# Patient Record
Sex: Female | Born: 1995 | Race: Black or African American | Hispanic: No | Smoking: Never smoker
Health system: Southern US, Community
[De-identification: ages and names within clinical notes are randomized; demographics above are authoritative.]

## PROBLEM LIST (undated history)

## (undated) ENCOUNTER — Inpatient Hospital Stay (HOSPITAL_COMMUNITY): Payer: Self-pay

## (undated) ENCOUNTER — Ambulatory Visit (HOSPITAL_COMMUNITY): Admission: EM | Payer: Medicaid Other | Source: Home / Self Care

## (undated) DIAGNOSIS — B999 Unspecified infectious disease: Secondary | ICD-10-CM

## (undated) DIAGNOSIS — R55 Syncope and collapse: Secondary | ICD-10-CM

## (undated) DIAGNOSIS — O139 Gestational [pregnancy-induced] hypertension without significant proteinuria, unspecified trimester: Secondary | ICD-10-CM

## (undated) DIAGNOSIS — R519 Headache, unspecified: Secondary | ICD-10-CM

## (undated) DIAGNOSIS — R51 Headache: Secondary | ICD-10-CM

## (undated) DIAGNOSIS — R1110 Vomiting, unspecified: Secondary | ICD-10-CM

## (undated) DIAGNOSIS — I10 Essential (primary) hypertension: Secondary | ICD-10-CM

## (undated) DIAGNOSIS — E079 Disorder of thyroid, unspecified: Secondary | ICD-10-CM

## (undated) DIAGNOSIS — O99019 Anemia complicating pregnancy, unspecified trimester: Secondary | ICD-10-CM

## (undated) DIAGNOSIS — J45909 Unspecified asthma, uncomplicated: Secondary | ICD-10-CM

## (undated) DIAGNOSIS — R011 Cardiac murmur, unspecified: Secondary | ICD-10-CM

## (undated) HISTORY — PX: WISDOM TOOTH EXTRACTION: SHX21

## (undated) HISTORY — PX: TUBAL LIGATION: SHX77

## (undated) HISTORY — DX: Anemia complicating pregnancy, unspecified trimester: O99.019

---

## 2013-08-17 ENCOUNTER — Encounter (HOSPITAL_COMMUNITY): Payer: Self-pay | Admitting: Emergency Medicine

## 2013-08-17 DIAGNOSIS — R350 Frequency of micturition: Secondary | ICD-10-CM | POA: Insufficient documentation

## 2013-08-17 DIAGNOSIS — Z3202 Encounter for pregnancy test, result negative: Secondary | ICD-10-CM | POA: Insufficient documentation

## 2013-08-17 DIAGNOSIS — Y9389 Activity, other specified: Secondary | ICD-10-CM | POA: Insufficient documentation

## 2013-08-17 DIAGNOSIS — IMO0002 Reserved for concepts with insufficient information to code with codable children: Secondary | ICD-10-CM | POA: Insufficient documentation

## 2013-08-17 DIAGNOSIS — Y92410 Unspecified street and highway as the place of occurrence of the external cause: Secondary | ICD-10-CM | POA: Insufficient documentation
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
## 2013-08-18 ENCOUNTER — Emergency Department (HOSPITAL_COMMUNITY)
Admission: EM | Admit: 2013-08-18 | Discharge: 2013-08-18 | Disposition: A | Discharge status: Home / Self Care | Payer: No Typology Code available for payment source | Attending: Emergency Medicine | Admitting: Emergency Medicine

## 2013-08-18 DIAGNOSIS — T148XXA Other injury of unspecified body region, initial encounter: Secondary | ICD-10-CM

## 2013-08-18 LAB — URINALYSIS, ROUTINE W REFLEX MICROSCOPIC
Leukocytes, UA: NEGATIVE
Nitrite: NEGATIVE
Specific Gravity, Urine: 1.021 (ref 1.005–1.030)
pH: 5.5 (ref 5.0–8.0)
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
## 2013-08-18 NOTE — ED Provider Notes (Signed)
  Medical screening examination/treatment/procedure(s) were performed by non-physician practitioner and as supervising physician I was immediately available for consultation/collaboration.   Gerhard Munch, MD 08/18/13 [phone]

## 2014-01-19 ENCOUNTER — Emergency Department (HOSPITAL_COMMUNITY)
Admission: EM | Admit: 2014-01-19 | Discharge: 2014-01-19 | Disposition: A | Discharge status: Home / Self Care | Payer: Medicaid Other | Attending: Emergency Medicine | Admitting: Emergency Medicine

## 2014-01-19 ENCOUNTER — Encounter (HOSPITAL_COMMUNITY): Payer: Self-pay | Admitting: Emergency Medicine

## 2014-01-19 DIAGNOSIS — R233 Spontaneous ecchymoses: Secondary | ICD-10-CM | POA: Insufficient documentation

## 2014-01-19 DIAGNOSIS — T148XXA Other injury of unspecified body region, initial encounter: Secondary | ICD-10-CM

## 2014-01-19 MED ORDER — IBUPROFEN 600 MG PO TABS: 600.0000 mg | ORAL_TABLET | Freq: Four times a day (QID) | ORAL | Status: DC | PRN
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
## 2014-01-19 NOTE — ED Notes (Signed)
Pt. reports bruise at left lateral thigh noticed this evening , denies injury or pain . Alert and oriented / respirations unlabored .

## 2014-01-19 NOTE — ED Notes (Signed)
PA at bedside; ice applied

## 2014-01-19 NOTE — ED Notes (Signed)
PT ambulated with baseline gait; VSS; A&Ox3; no signs of distress; respirations even and unlabored; skin warm and dry; no questions upon discharge.  

## 2014-01-19 NOTE — Discharge Instructions (Signed)
You have a bruise You can take Ibuprofen for discomfort, ice on the area for 20 minutes every 2 hours as needed

## 2014-01-20 NOTE — ED Provider Notes (Signed)
Medical screening examination/treatment/procedure(s) were performed by non-physician practitioner and as supervising physician I was immediately available for consultation/collaboration.  EKG Interpretation   None         Stacey SkeensJoshua M Yoni Lobos, MD 01/20/14 0111

## 2014-02-07 ENCOUNTER — Emergency Department (HOSPITAL_COMMUNITY)
Admission: EM | Admit: 2014-02-07 | Discharge: 2014-02-07 | Disposition: A | Discharge status: Home / Self Care | Payer: Medicaid Other | Attending: Emergency Medicine | Admitting: Emergency Medicine

## 2014-02-07 ENCOUNTER — Encounter (HOSPITAL_COMMUNITY): Payer: Self-pay | Admitting: Emergency Medicine

## 2014-02-07 DIAGNOSIS — B9689 Other specified bacterial agents as the cause of diseases classified elsewhere: Secondary | ICD-10-CM | POA: Insufficient documentation

## 2014-02-07 DIAGNOSIS — R059 Cough, unspecified: Secondary | ICD-10-CM | POA: Insufficient documentation

## 2014-02-07 DIAGNOSIS — N760 Acute vaginitis: Secondary | ICD-10-CM | POA: Insufficient documentation

## 2014-02-07 DIAGNOSIS — A499 Bacterial infection, unspecified: Secondary | ICD-10-CM | POA: Insufficient documentation

## 2014-02-07 DIAGNOSIS — Z3202 Encounter for pregnancy test, result negative: Secondary | ICD-10-CM | POA: Insufficient documentation

## 2014-02-07 DIAGNOSIS — N390 Urinary tract infection, site not specified: Secondary | ICD-10-CM | POA: Insufficient documentation

## 2014-02-07 DIAGNOSIS — R05 Cough: Secondary | ICD-10-CM

## 2014-02-07 LAB — URINALYSIS, ROUTINE W REFLEX MICROSCOPIC
BILIRUBIN URINE: NEGATIVE
GLUCOSE, UA: NEGATIVE mg/dL
Hgb urine dipstick: NEGATIVE
Ketones, ur: NEGATIVE mg/dL
LEUKOCYTES UA: NEGATIVE
NITRITE: NEGATIVE
PH: 7.5 (ref 5.0–8.0)
Protein, ur: NEGATIVE mg/dL
SPECIFIC GRAVITY, URINE: 1.018 (ref 1.005–1.030)
Urobilinogen, UA: 1 mg/dL (ref 0.0–1.0)

## 2014-02-07 LAB — WET PREP, GENITAL
TRICH WET PREP: NONE SEEN
Yeast Wet Prep HPF POC: NONE SEEN

## 2014-02-07 LAB — POCT PREGNANCY, URINE: Preg Test, Ur: NEGATIVE

## 2014-02-07 MED ORDER — PROMETHAZINE-DM 6.25-15 MG/5ML PO SYRP: 5.0000 mL | ORAL_SOLUTION | Freq: Four times a day (QID) | ORAL | Status: DC | PRN

## 2014-02-07 MED ORDER — METRONIDAZOLE 1 % EX GEL: Freq: Every day | CUTANEOUS | Status: DC

## 2014-02-07 MED ORDER — ACETAMINOPHEN 325 MG PO TABS
650.0000 mg | ORAL_TABLET | Freq: Once | ORAL | Status: AC
  Administered 2014-02-07: 650 mg via ORAL
  Filled 2014-02-07: qty 2

## 2014-02-07 NOTE — Discharge Instructions (Signed)

## 2014-02-07 NOTE — ED Notes (Signed)
Pt c/o dry cough, back pain and vaginal discharge for past few days

## 2014-02-07 NOTE — ED Provider Notes (Signed)
CSN: [id number]     Arrival date & time 02/07/14  1027 History   First MD Initiated Contact with Patient 02/07/14 1034     Chief Complaint  Patient presents with  . Cough  . Urinary Tract Infection      HPI  Patient presents with a cough for several days. Vaginal discharge last 3-4 days. Occasional back pain. No history for hematuria. No attempted treatment at home. The recent antibiotic treatment. Denies history of STDs or candida infections. No chest pain no shortness of breath no history of asthma or wheezing.  History reviewed. No pertinent past medical history. History reviewed. No pertinent past surgical history. History reviewed. No pertinent family history. History  Substance Use Topics  . Smoking status: Never Smoker   . Smokeless tobacco: Not on file  . Alcohol Use: No   OB History   Grav Para Term Preterm Abortions TAB SAB Ect Mult Living                 Review of Systems  Constitutional: Negative for fever, chills, diaphoresis, appetite change and fatigue.  HENT: Negative for mouth sores, sore throat and trouble swallowing.   Eyes: Negative for visual disturbance.  Respiratory: Positive for cough. Negative for chest tightness, shortness of breath and wheezing.   Cardiovascular: Negative for chest pain.  Gastrointestinal: Negative for nausea, vomiting, abdominal pain, diarrhea and abdominal distention.  Endocrine: Negative for polydipsia, polyphagia and polyuria.  Genitourinary: Positive for vaginal discharge. Negative for dysuria, frequency and hematuria.  Musculoskeletal: Negative for gait problem.  Skin: Negative for color change, pallor and rash.  Neurological: Negative for dizziness, syncope, light-headedness and headaches.  Hematological: Does not bruise/bleed easily.  Psychiatric/Behavioral: Negative for behavioral problems and confusion.      Allergies  Review of patient's allergies indicates no known allergies.  Home Medications   Current  Outpatient Rx  Name  Route  Sig  Dispense  Refill  . metroNIDAZOLE (METROGEL) 1 % gel   Topical   Apply topically daily.   45 g   0   . promethazine-dextromethorphan (PROMETHAZINE-DM) 6.25-15 MG/5ML syrup   Oral   Take 5 mLs by mouth 4 (four) times daily as needed for cough.   118 mL   0    BP 108/77  Pulse 103  Temp(Src) 100.7 F (38.2 C) (Oral)  Resp 14  Ht 5\' 2"  (1.575 m)  Wt 125 lb (56.7 kg)  BMI 22.86 kg/m2  SpO2 97% Physical Exam  Constitutional: She is oriented to person, place, and time. She appears well-developed and well-nourished. No distress.  HENT:  Head: Normocephalic.  Eyes: Conjunctivae are normal. Pupils are equal, round, and reactive to light. No scleral icterus.  Neck: Normal range of motion. Neck supple. No thyromegaly present.  Cardiovascular: Normal rate and regular rhythm.  Exam reveals no gallop and no friction rub.   No murmur heard. Pulmonary/Chest: Effort normal and breath sounds normal. No respiratory distress. She has no wheezes. She has no rales.  Clear lungs. No abnormal breath sounds. No wheezing. No pronation.  Abdominal: Soft. Bowel sounds are normal. She exhibits no distension. There is no tenderness. There is no rebound.  Genitourinary:    Musculoskeletal: Normal range of motion.  Neurological: She is alert and oriented to person, place, and time.  Skin: Skin is warm and dry. No rash noted.  Psychiatric: She has a normal mood and affect. Her behavior is normal.    ED Course  Procedures (including critical care time)  Labs Review Labs Reviewed  WET PREP, GENITAL - Abnormal; Notable for the following:    Clue Cells Wet Prep HPF POC FEW (*)    WBC, Wet Prep HPF POC FEW (*)    All other components within normal limits  URINALYSIS, ROUTINE W REFLEX MICROSCOPIC - Abnormal; Notable for the following:    APPearance CLOUDY (*)    All other components within normal limits  GC/CHLAMYDIA PROBE AMP  POCT PREGNANCY, URINE   Imaging  Review No results found.  EKG Interpretation   None       MDM   Final diagnoses:  Cough  Bacterial vaginosis    Colic exam shows a noninflamed cervix. No cervical motion tenderness or adnexal pain fullness or tenderness. She is not pregnant. Wet prep shows vaginosis. Normal exam of her lungs. Plan is cough suppressant Flagyl. Warned of the Antabuse effects of the Flagyl. Primary care followup.    Rolland Porter, MD 02/07/14 [phone]

## 2014-02-07 NOTE — ED Notes (Signed)
Vital signs stable. 

## 2014-02-08 LAB — GC/CHLAMYDIA PROBE AMP
CT Probe RNA: POSITIVE — AB
GC PROBE AMP APTIMA: NEGATIVE

## 2014-02-09 NOTE — ED Notes (Signed)
Chart sent to EDP office for review.+ chlamydia

## 2014-02-11 NOTE — ED Notes (Signed)
Chart sent to EDP office for review.Rx for Zithromax 1,000 mg po once as treatment for Chlamydia per Dr Greta DoomBowie .

## 2014-02-15 NOTE — ED Notes (Signed)
Rx called to pharmacy by Liz PFM 

## 2014-04-03 ENCOUNTER — Emergency Department (HOSPITAL_COMMUNITY)
Admission: EM | Admit: 2014-04-03 | Discharge: 2014-04-03 | Disposition: A | Discharge status: Home / Self Care | Payer: Medicaid Other | Attending: Emergency Medicine | Admitting: Emergency Medicine

## 2014-04-03 ENCOUNTER — Encounter (HOSPITAL_COMMUNITY): Payer: Self-pay | Admitting: Emergency Medicine

## 2014-04-03 DIAGNOSIS — A499 Bacterial infection, unspecified: Secondary | ICD-10-CM | POA: Insufficient documentation

## 2014-04-03 DIAGNOSIS — B9689 Other specified bacterial agents as the cause of diseases classified elsewhere: Secondary | ICD-10-CM | POA: Insufficient documentation

## 2014-04-03 DIAGNOSIS — Z3202 Encounter for pregnancy test, result negative: Secondary | ICD-10-CM | POA: Insufficient documentation

## 2014-04-03 DIAGNOSIS — N760 Acute vaginitis: Secondary | ICD-10-CM | POA: Insufficient documentation

## 2014-04-03 LAB — URINALYSIS, ROUTINE W REFLEX MICROSCOPIC
BILIRUBIN URINE: NEGATIVE
Glucose, UA: NEGATIVE mg/dL
HGB URINE DIPSTICK: NEGATIVE
KETONES UR: NEGATIVE mg/dL
Nitrite: NEGATIVE
PH: 7 (ref 5.0–8.0)
Protein, ur: NEGATIVE mg/dL
SPECIFIC GRAVITY, URINE: 1.019 (ref 1.005–1.030)
Urobilinogen, UA: 1 mg/dL (ref 0.0–1.0)

## 2014-04-03 LAB — WET PREP, GENITAL
TRICH WET PREP: NONE SEEN
YEAST WET PREP: NONE SEEN

## 2014-04-03 LAB — PREGNANCY, URINE: Preg Test, Ur: NEGATIVE

## 2014-04-03 LAB — URINE MICROSCOPIC-ADD ON

## 2014-04-03 MED ORDER — AZITHROMYCIN 200 MG/5ML PO SUSR
1000.0000 mg | Freq: Once | ORAL | Status: AC
  Administered 2014-04-03: 1000 mg via ORAL
  Filled 2014-04-03 (×2): qty 25

## 2014-04-03 MED ORDER — CEFTRIAXONE SODIUM 250 MG IJ SOLR
250.0000 mg | INTRAMUSCULAR | Status: DC
  Administered 2014-04-03: 250 mg via INTRAMUSCULAR
  Filled 2014-04-03: qty 250

## 2014-04-03 MED ORDER — AZITHROMYCIN 250 MG PO TABS
1000.0000 mg | ORAL_TABLET | Freq: Once | ORAL | Status: DC
  Filled 2014-04-03: qty 4

## 2014-04-03 MED ORDER — METRONIDAZOLE 500 MG PO TABS
2000.0000 mg | ORAL_TABLET | Freq: Once | ORAL | Status: AC
  Administered 2014-04-03: 2000 mg via ORAL
  Filled 2014-04-03: qty 4

## 2014-04-03 MED ORDER — ONDANSETRON 4 MG PO TBDP
4.0000 mg | ORAL_TABLET | Freq: Once | ORAL | Status: AC
  Administered 2014-04-03: 4 mg via ORAL
  Filled 2014-04-03: qty 1

## 2014-04-03 MED ORDER — LIDOCAINE HCL (PF) 1 % IJ SOLN
INTRAMUSCULAR | Status: AC
  Administered 2014-04-03: 2 mL
  Filled 2014-04-03: qty 5

## 2014-04-03 NOTE — ED Notes (Signed)
Pt reports having vaginal pain, discomfort, and vaginal discharge x 3 weeks.

## 2014-04-03 NOTE — ED Notes (Signed)
Patient states cannot take pills; tried to take Flagyl and began to vomit. Neva SeatGreene, PA changing meds to liquid and remainder of flagyl crushed and mixed in applesauce. Patient tolerating the applesauce mixture.

## 2014-04-03 NOTE — ED Provider Notes (Signed)
CSN: [id number]     Arrival date & time 04/03/14  1304 History   First MD Initiated Contact with Patient 04/03/14 1315     Chief Complaint  Patient presents with  . Vaginal Discharge     (Consider location/radiation/quality/duration/timing/severity/associated sxs/prior Treatment) HPI  Patient reports 3 weeks of suprapubic pain and discharge. She waited this long to present because she has had work everyday. She says that her symptoms have not gotten worse but have not been improving. She reports taking two at home pregnancy test, the first was positive and the second was negative. She has not had any vomiting, fevers, weakness or confusion. She reports the discharge to be cloudy white. NO odor, no itching. No abnormal bleeding.  History reviewed. No pertinent past medical history. History reviewed. No pertinent past surgical history. History reviewed. No pertinent family history. History  Substance Use Topics  . Smoking status: Never Smoker   . Smokeless tobacco: Not on file  . Alcohol Use: No   OB History   Grav Para Term Preterm Abortions TAB SAB Ect Mult Living                 Review of Systems   Review of Systems  Gen: no weight loss, fevers, chills, night sweats  Eyes: no discharge or drainage, no occular pain or visual changes  Nose: no epistaxis or rhinorrhea  Mouth: no dental pain, no sore throat  Neck: no neck pain  Lungs:No wheezing, coughing or hemoptysis CV: no chest pain, palpitations, dependent edema or orthopnea  Abd: no abdominal pain, nausea, vomiting, diarrhea GU: no dysuria or gross hematuria, + suprapubic pain and vaginal discharge MSK:  No muscle weakness or pain Neuro: no headache, no focal neurologic deficits  Skin: no rash or wounds Psyche: no complaints    Allergies  Review of patient's allergies indicates no known allergies.  Home Medications  No current outpatient prescriptions on file. BP 120/69  Pulse 76  Temp(Src) 98.9 F (37.2 C)  (Oral)  Resp 18  Ht 5\' 2"  (1.575 m)  Wt 124 lb (56.246 kg)  BMI 22.67 kg/m2  SpO2 100%  LMP 03/16/2014 Physical Exam  Nursing note and vitals reviewed. Constitutional: She appears well-developed and well-nourished. No distress.  HENT:  Head: Normocephalic and atraumatic.  Eyes: Pupils are equal, round, and reactive to light.  Neck: Normal range of motion. Neck supple.  Cardiovascular: Normal rate and regular rhythm.   Pulmonary/Chest: Effort normal.  Abdominal: Soft.  Genitourinary: Uterus normal. Cervix exhibits discharge. Cervix exhibits no motion tenderness and no friability. No erythema, tenderness or bleeding around the vagina. No foreign body around the vagina. Vaginal discharge found.  Neurological: She is alert.  Skin: Skin is warm and dry.    ED Course  Procedures (including critical care time) Labs Review Labs Reviewed  WET PREP, GENITAL - Abnormal; Notable for the following:    Clue Cells Wet Prep HPF POC FEW (*)    WBC, Wet Prep HPF POC FEW (*)    All other components within normal limits  URINALYSIS, ROUTINE W REFLEX MICROSCOPIC - Abnormal; Notable for the following:    APPearance CLOUDY (*)    Leukocytes, UA SMALL (*)    All other components within normal limits  URINE MICROSCOPIC-ADD ON - Abnormal; Notable for the following:    Squamous Epithelial / LPF FEW (*)    All other components within normal limits  GC/CHLAMYDIA PROBE AMP  PREGNANCY, URINE   Imaging Review No results found.  EKG Interpretation None      MDM   Final diagnoses:  Vaginosis   Patients wet prep shows small amount of clue cells and WBC's. She has been sexually active without protection. Will give her Azithromycin, Rocephin, Flagyl in the ED. Pt advised to refrain from sex for 1 week and to await for the results of the gc cultures.  Referral to wom[facility] outpatient clinics. Pt looks well, no pain, VSS.   18 y.o.Stacey Reid's evaluation in the Emergency Department is  complete. It has been determined that no acute conditions requiring further emergency intervention are present at this time. The patient/guardian have been advised of the diagnosis and plan. We have discussed signs and symptoms that warrant return to the ED, such as changes or worsening in symptoms.  Vital signs are stable at discharge. Filed Vitals:   04/03/14 1328  BP: 120/69  Pulse: 76  Temp:   Resp:     Patient/guardian has voiced understanding and agreed to follow-up with the PCP or specialist.    Dorthula Matasiffany G Callaway Hardigree, PA-C 04/03/14 1416

## 2014-04-03 NOTE — ED Provider Notes (Signed)
Medical screening examination/treatment/procedure(s) were performed by non-physician practitioner and as supervising physician I was immediately available for consultation/collaboration.     Stacey Lyonsouglas Kitrina Maurin, MD 04/03/14 [phone]

## 2014-04-03 NOTE — Discharge Instructions (Signed)

## 2014-04-05 LAB — GC/CHLAMYDIA PROBE AMP
CT PROBE, AMP APTIMA: POSITIVE — AB
GC Probe RNA: NEGATIVE

## 2014-04-10 ENCOUNTER — Telehealth (HOSPITAL_BASED_OUTPATIENT_CLINIC_OR_DEPARTMENT_OTHER): Payer: Self-pay | Admitting: Emergency Medicine

## 2014-04-13 ENCOUNTER — Emergency Department (HOSPITAL_COMMUNITY)
Admission: EM | Admit: 2014-04-13 | Discharge: 2014-04-13 | Discharge status: Against Medical Advice | Payer: Medicaid Other | Attending: Emergency Medicine | Admitting: Emergency Medicine

## 2014-04-13 DIAGNOSIS — N898 Other specified noninflammatory disorders of vagina: Secondary | ICD-10-CM | POA: Insufficient documentation

## 2014-04-13 DIAGNOSIS — Z3202 Encounter for pregnancy test, result negative: Secondary | ICD-10-CM | POA: Insufficient documentation

## 2014-04-13 DIAGNOSIS — Z8619 Personal history of other infectious and parasitic diseases: Secondary | ICD-10-CM | POA: Insufficient documentation

## 2014-04-13 DIAGNOSIS — N949 Unspecified condition associated with female genital organs and menstrual cycle: Secondary | ICD-10-CM | POA: Insufficient documentation

## 2014-04-13 LAB — URINALYSIS, ROUTINE W REFLEX MICROSCOPIC
Bilirubin Urine: NEGATIVE
Glucose, UA: NEGATIVE mg/dL
Hgb urine dipstick: NEGATIVE
Ketones, ur: NEGATIVE mg/dL
Leukocytes, UA: NEGATIVE
Nitrite: NEGATIVE
Protein, ur: NEGATIVE mg/dL
Specific Gravity, Urine: 1.02 (ref 1.005–1.030)
Urobilinogen, UA: 1 mg/dL (ref 0.0–1.0)
pH: 6.5 (ref 5.0–8.0)

## 2014-04-13 LAB — CBC WITH DIFFERENTIAL/PLATELET
Basophils Absolute: 0 K/uL (ref 0.0–0.1)
Basophils Relative: 0 % (ref 0–1)
Eosinophils Absolute: 0 K/uL (ref 0.0–0.7)
Eosinophils Relative: 1 % (ref 0–5)
HCT: 37.9 % (ref 36.0–46.0)
Hemoglobin: 13.4 g/dL (ref 12.0–15.0)
Lymphocytes Relative: 39 % (ref 12–46)
Lymphs Abs: 2.6 10*3/uL (ref 0.7–4.0)
MCH: 31.2 pg (ref 26.0–34.0)
MCHC: 35.4 g/dL (ref 30.0–36.0)
MCV: 88.3 fL (ref 78.0–100.0)
Monocytes Absolute: 0.8 K/uL (ref 0.1–1.0)
Monocytes Relative: 12 % (ref 3–12)
Neutro Abs: 3.3 10*3/uL (ref 1.7–7.7)
Neutrophils Relative %: 48 % (ref 43–77)
Platelets: 339 10*3/uL (ref 150–400)
RBC: 4.29 MIL/uL (ref 3.87–5.11)
RDW: 12 % (ref 11.5–15.5)
WBC: 6.8 10*3/uL (ref 4.0–10.5)

## 2014-04-13 LAB — COMPREHENSIVE METABOLIC PANEL WITH GFR
AST: 33 U/L (ref 0–37)
Albumin: 4 g/dL (ref 3.5–5.2)
CO2: 26 meq/L (ref 19–32)
Calcium: 9.6 mg/dL (ref 8.4–10.5)
Creatinine, Ser: 0.71 mg/dL (ref 0.50–1.10)
GFR calc non Af Amer: >90 mL/min (ref >90)

## 2014-04-13 LAB — COMPREHENSIVE METABOLIC PANEL
ALT: 29 U/L (ref 0–35)
Alkaline Phosphatase: 55 U/L (ref 39–117)
BUN: 8 mg/dL (ref 6–23)
Chloride: 103 mEq/L (ref 96–112)
GFR calc Af Amer: >90 mL/min (ref >90)
Glucose, Bld: 79 mg/dL (ref 70–99)
Potassium: 3.8 mEq/L (ref 3.7–5.3)
Sodium: 137 mEq/L (ref 137–147)
Total Bilirubin: 0.4 mg/dL (ref 0.3–1.2)
Total Protein: 7.8 g/dL (ref 6.0–8.3)

## 2014-04-13 LAB — PREGNANCY, URINE: PREG TEST UR: NEGATIVE

## 2014-04-13 NOTE — ED Notes (Signed)
Pt received emergency phone call from home and walked the room with out receiving discharge after pelvic exam completed. Pt was not ava liable to sign out.

## 2014-04-13 NOTE — ED Provider Notes (Signed)
CSN: [id number]     Arrival date & time 04/13/14  2111 History   First MD Initiated Contact with Patient 04/13/14 2204     Chief Complaint  Patient presents with  . Abdominal Pain  . Possible Pregnancy     (Consider location/radiation/quality/duration/timing/severity/associated sxs/prior Treatment) HPI  Patient presents to the ED with complaints of suprapubic abdominal pains for the past three days with spotting. I saw this patient on 04/03/2014 with similar complaints of suprapubic pain with discharge. She was concerned she was pregnant at that time. She had at at home pregnancy test which was negative, and negative in the ED. She comes back concerned that she may be pregnant, wondering if she has ectopic, and concerned with her pelvic pain and spotting. She was positive for Chlamydia at her last visit, she was treated for it in the ED. She is unsure if she has slept with the person who gave her the chlamydia since I last saw her but she denies having any vaginal discharge today. No nausea, vomiting, diarrhea, fevers.  No past medical history on file. No past surgical history on file. No family history on file. History  Substance Use Topics  . Smoking status: Never Smoker   . Smokeless tobacco: Not on file  . Alcohol Use: No   OB History   Grav Para Term Preterm Abortions TAB SAB Ect Mult Living                 Review of Systems  Review of Systems  Gen: no weight loss, fevers, chills, night sweats  Eyes: no discharge or drainage, no occular pain or visual changes  Nose: no epistaxis or rhinorrhea  Mouth: no dental pain, no sore throat  Neck: no neck pain  Lungs:No wheezing, coughing or hemoptysis CV: no chest pain, palpitations, dependent edema or orthopnea  Abd: no abdominal pain, nausea, vomiting, diarrhea GU: no dysuria or gross hematuria + pelvic pain and spotting blood MSK:  No muscle weakness or pain Neuro: no headache, no focal neurologic deficits  Skin: no rash or  wounds Psyche: no complaints     Allergies  Review of patient's allergies indicates no known allergies.  Home Medications   Prior to Admission medications   Not on File   BP 113/72  Pulse 65  Temp(Src) 98 F (36.7 C) (Oral)  Resp 20  Ht 5\' 1"  (1.549 m)  Wt 124 lb (56.246 kg)  BMI 23.44 kg/m2  SpO2 100%  LMP 03/16/2014 Physical Exam  Nursing note and vitals reviewed. Constitutional: She appears well-developed and well-nourished. No distress.  HENT:  Head: Normocephalic and atraumatic.  Eyes: Pupils are equal, round, and reactive to light.  Neck: Normal range of motion. Neck supple.  Cardiovascular: Normal rate and regular rhythm.   Pulmonary/Chest: Effort normal.  Abdominal: Soft.  Neurological: She is alert.  Skin: Skin is warm and dry.    ED Course  Procedures (including critical care time) Labs Review Labs Reviewed  GC/CHLAMYDIA PROBE AMP  WET PREP, GENITAL  CBC WITH DIFFERENTIAL  COMPREHENSIVE METABOLIC PANEL  URINALYSIS, ROUTINE W REFLEX MICROSCOPIC  PREGNANCY, URINE  POC URINE PREG, ED    Imaging Review No results found.   EKG Interpretation None      MDM   Final diagnoses:  None    Patient had CBC, CMP, urinalysis and urine preg done. Urine preg is negative. I had ordered gc, wet prep and transvag US for further evaluation. Before I could do pelvic exam the  patient was already dressed and told me that her fiance and her mom called her and said she needs to come home right now and they wouldn't tell her why.  She has decided to leave AMA because she is concerned about what is happening with her family. She reports that she will return here when she can or [facility].  Pt leaving AMA    Dorthula Matasiffany G Carlis Burnsworth, PA-C 04/13/14 2327

## 2014-04-13 NOTE — ED Provider Notes (Signed)
  Medical screening examination/treatment/procedure(s) were performed by non-physician practitioner and as supervising physician I was immediately available for consultation/collaboration.   EKG Interpretation None                  If you have any questions or concerns, please don't hesitate to call.     Sincerely,                       Gavin PoundMichael Y. Ibrahima Holberg, MD 04/14/14 0000

## 2014-04-13 NOTE — ED Notes (Signed)
PA at bedside/ pelvic in process

## 2014-04-13 NOTE — ED Notes (Signed)
Pt reports abdominal pain for three days with some spotting. Pt was suppose to start period 4/24. Pt had a positive and negative pregnancy test at home. Pt wants to be checked to make sure she isn't having an ectopic pregnancy. Pt A&Ox4, respirations equal and unlabored, skin warm and dry

## 2014-04-14 ENCOUNTER — Encounter (HOSPITAL_COMMUNITY): Payer: Self-pay | Admitting: Emergency Medicine

## 2014-04-14 ENCOUNTER — Emergency Department (HOSPITAL_COMMUNITY)
Admission: EM | Admit: 2014-04-14 | Discharge: 2014-04-14 | Disposition: A | Discharge status: Home / Self Care | Payer: Medicaid Other | Attending: Emergency Medicine | Admitting: Emergency Medicine

## 2014-04-14 ENCOUNTER — Emergency Department (HOSPITAL_COMMUNITY): Payer: Medicaid Other

## 2014-04-14 DIAGNOSIS — N83209 Unspecified ovarian cyst, unspecified side: Secondary | ICD-10-CM | POA: Insufficient documentation

## 2014-04-14 DIAGNOSIS — Z3202 Encounter for pregnancy test, result negative: Secondary | ICD-10-CM | POA: Insufficient documentation

## 2014-04-14 DIAGNOSIS — B9689 Other specified bacterial agents as the cause of diseases classified elsewhere: Secondary | ICD-10-CM | POA: Insufficient documentation

## 2014-04-14 DIAGNOSIS — N83202 Unspecified ovarian cyst, left side: Secondary | ICD-10-CM

## 2014-04-14 DIAGNOSIS — R102 Pelvic and perineal pain: Secondary | ICD-10-CM

## 2014-04-14 DIAGNOSIS — N760 Acute vaginitis: Secondary | ICD-10-CM | POA: Insufficient documentation

## 2014-04-14 DIAGNOSIS — A499 Bacterial infection, unspecified: Secondary | ICD-10-CM | POA: Insufficient documentation

## 2014-04-14 LAB — URINALYSIS, ROUTINE W REFLEX MICROSCOPIC
Bilirubin Urine: NEGATIVE
Glucose, UA: NEGATIVE mg/dL
HGB URINE DIPSTICK: NEGATIVE
KETONES UR: NEGATIVE mg/dL
Leukocytes, UA: NEGATIVE
Nitrite: NEGATIVE
Protein, ur: NEGATIVE mg/dL
SPECIFIC GRAVITY, URINE: 1.021 (ref 1.005–1.030)
UROBILINOGEN UA: 1 mg/dL (ref 0.0–1.0)
pH: 6.5 (ref 5.0–8.0)

## 2014-04-14 LAB — CBC WITH DIFFERENTIAL/PLATELET
Basophils Absolute: 0 10*3/uL (ref 0.0–0.1)
Basophils Relative: 0 % (ref 0–1)
EOS ABS: 0.1 10*3/uL (ref 0.0–0.7)
EOS PCT: 1 % (ref 0–5)
HCT: 37.7 % (ref 36.0–46.0)
Hemoglobin: 13.2 g/dL (ref 12.0–15.0)
LYMPHS ABS: 1.7 10*3/uL (ref 0.7–4.0)
Lymphocytes Relative: 40 % (ref 12–46)
MCH: 31.2 pg (ref 26.0–34.0)
MCHC: 35 g/dL (ref 30.0–36.0)
MCV: 89.1 fL (ref 78.0–100.0)
Monocytes Absolute: 0.7 10*3/uL (ref 0.1–1.0)
Monocytes Relative: 15 % — ABNORMAL HIGH (ref 3–12)
Neutro Abs: 1.9 10*3/uL (ref 1.7–7.7)
Neutrophils Relative %: 44 % (ref 43–77)
Platelets: 321 10*3/uL (ref 150–400)
RBC: 4.23 MIL/uL (ref 3.87–5.11)
RDW: 12 % (ref 11.5–15.5)
WBC: 4.3 10*3/uL (ref 4.0–10.5)

## 2014-04-14 LAB — WET PREP, GENITAL
TRICH WET PREP: NONE SEEN
Yeast Wet Prep HPF POC: NONE SEEN

## 2014-04-14 LAB — BASIC METABOLIC PANEL
BUN: 7 mg/dL (ref 6–23)
CO2: 26 meq/L (ref 19–32)
CREATININE: 0.77 mg/dL (ref 0.50–1.10)
Calcium: 9.2 mg/dL (ref 8.4–10.5)
Chloride: 102 mEq/L (ref 96–112)
GFR calc Af Amer: >90 mL/min (ref >90)
GLUCOSE: 82 mg/dL (ref 70–99)
Potassium: 4 mEq/L (ref 3.7–5.3)
SODIUM: 140 meq/L (ref 137–147)

## 2014-04-14 LAB — PREGNANCY, URINE: Preg Test, Ur: NEGATIVE

## 2014-04-14 MED ORDER — NAPROXEN 250 MG PO TABS: 250.0000 mg | ORAL_TABLET | Freq: Two times a day (BID) | ORAL | Status: DC

## 2014-04-14 MED ORDER — METRONIDAZOLE 500 MG PO TABS: 500.0000 mg | ORAL_TABLET | Freq: Two times a day (BID) | ORAL | Status: DC

## 2014-04-14 MED ORDER — ACETAMINOPHEN 500 MG PO TABS
1000.0000 mg | ORAL_TABLET | Freq: Once | ORAL | Status: AC
  Administered 2014-04-14: 1000 mg via ORAL
  Filled 2014-04-14: qty 2

## 2014-04-14 MED ORDER — IBUPROFEN 200 MG PO TABS
400.0000 mg | ORAL_TABLET | Freq: Once | ORAL | Status: AC
Start: 2014-04-14 — End: 2014-04-14
  Administered 2014-04-14: 400 mg via ORAL
  Filled 2014-04-14: qty 2

## 2014-04-14 MED ORDER — HYDROCODONE-ACETAMINOPHEN 5-325 MG PO TABS: ORAL_TABLET | ORAL | Status: DC

## 2014-04-14 NOTE — ED Provider Notes (Signed)
CSN: [id number]     Arrival date & time 04/14/14  1239 History   First MD Initiated Contact with Patient 04/14/14 1243     Chief Complaint  Patient presents with  . Abdominal Pain     HPI Pt was seen at 1310.   Per pt, c/o gradual onset and persistence of constant pelvic "pain" for the past 4 days.  Has been associated with vaginal discharge and "spotting."  Describes the abd pain as "aching" and "throbbing."  Pt was evaluated in the ED yesterday for same and left AMA due to "family issues." Pt states she came back today "to finish the testing." She is concerned that she "might be pregnant." Denies N/V/D, no fevers, no back pain, no rash, no CP/SOB, no black or blood in stools.      No past medical history on file.  History reviewed. No pertinent past surgical history.  History  Substance Use Topics  . Smoking status: Never Smoker   . Smokeless tobacco: Not on file  . Alcohol Use: No    Review of Systems ROS: Statement: All systems negative except as marked or noted in the HPI; Constitutional: Negative for fever and chills. ; ; Eyes: Negative for eye pain, redness and discharge. ; ; ENMT: Negative for ear pain, hoarseness, nasal congestion, sinus pressure and sore throat. ; ; Cardiovascular: Negative for chest pain, palpitations, diaphoresis, dyspnea and peripheral edema. ; ; Respiratory: Negative for cough, wheezing and stridor. ; ; Gastrointestinal: Negative for nausea, vomiting, diarrhea, abdominal pain, blood in stool, hematemesis, jaundice and rectal bleeding. ; ; Genitourinary: Negative for dysuria, flank pain and hematuria. ; ; GYN:  +pelvic pain, +vaginal discharge, "spotting," no vulvar pain.;; Musculoskeletal: Negative for back pain and neck pain. Negative for swelling and trauma.; ; Skin: Negative for pruritus, rash, abrasions, blisters, bruising and skin lesion.; ; Neuro: Negative for headache, lightheadedness and neck stiffness. Negative for weakness, altered level of  consciousness , altered mental status, extremity weakness, paresthesias, involuntary movement, seizure and syncope.      Allergies  Review of patient's allergies indicates no known allergies.  Home Medications   Prior to Admission medications   Not on File   BP 117/71  Pulse 80  Temp(Src) 98.3 F (36.8 C) (Oral)  Resp 18  SpO2 100%  LMP 03/16/2014 Physical Exam 1315: Physical examination:  Nursing notes reviewed; Vital signs and O2 SAT reviewed;  Constitutional: Well developed, Well nourished, Well hydrated, In no acute distress; Head:  Normocephalic, atraumatic; Eyes: EOMI, PERRL, No scleral icterus; ENMT: Mouth and pharynx normal, Mucous membranes moist; Neck: Supple, Full range of motion, No lymphadenopathy; Cardiovascular: Regular rate and rhythm, No murmur, rub, or gallop; Respiratory: Breath sounds clear & equal bilaterally, No rales, rhonchi, wheezes.  Speaking full sentences with ease, Normal respiratory effort/excursion; Chest: Nontender, Movement normal; Abdomen: Soft, Nontender, Nondistended, Normal bowel sounds; Genitourinary: No CVA tenderness. Pelvic exam performed with permission of pt and female ED tech assist during exam.  External genitalia w/o lesions. Vaginal vault without discharge.  Cervix w/o lesions, not friable, GC/chlam and wet prep obtained and sent to lab.  Bimanual exam w/o CMT or bilateral adnexal tenderness, +suprapubic tenderness..; Extremities: Pulses normal, No tenderness, No edema, No calf edema or asymmetry.; Neuro: AA&Ox3, Major CN grossly intact.  Speech clear. No gross focal motor or sensory deficits in extremities.; Skin: Color normal, Warm, Dry.   ED Course  Procedures     EKG Interpretation None      MDM  MDM Reviewed: previous chart, nursing note and vitals Reviewed previous: labs Interpretation: labs and ultrasound    Results for orders placed during the hospital encounter of 04/14/14  WET PREP, GENITAL      Result Value Ref Range    Yeast Wet Prep HPF POC NONE SEEN  NONE SEEN   Trich, Wet Prep NONE SEEN  NONE SEEN   Clue Cells Wet Prep HPF POC FEW (*) NONE SEEN   WBC, Wet Prep HPF POC FEW (*) NONE SEEN  URINALYSIS, ROUTINE W REFLEX MICROSCOPIC      Result Value Ref Range   Color, Urine YELLOW  YELLOW   APPearance CLEAR  CLEAR   Specific Gravity, Urine 1.021  1.005 - 1.030   pH 6.5  5.0 - 8.0   Glucose, UA NEGATIVE  NEGATIVE mg/dL   Hgb urine dipstick NEGATIVE  NEGATIVE   Bilirubin Urine NEGATIVE  NEGATIVE   Ketones, ur NEGATIVE  NEGATIVE mg/dL   Protein, ur NEGATIVE  NEGATIVE mg/dL   Urobilinogen, UA 1.0  0.0 - 1.0 mg/dL   Nitrite NEGATIVE  NEGATIVE   Leukocytes, UA NEGATIVE  NEGATIVE  PREGNANCY, URINE      Result Value Ref Range   Preg Test, Ur NEGATIVE  NEGATIVE  BASIC METABOLIC PANEL      Result Value Ref Range   Sodium 140  137 - 147 mEq/L   Potassium 4.0  3.7 - 5.3 mEq/L   Chloride 102  96 - 112 mEq/L   CO2 26  19 - 32 mEq/L   Glucose, Bld 82  70 - 99 mg/dL   BUN 7  6 - 23 mg/dL   Creatinine, Ser 9.520.77  0.50 - 1.10 mg/dL   Calcium 9.2  8.4 - 84.110.5 mg/dL   GFR calc non Af Amer >90  >90 mL/min   GFR calc Af Amer >90  >90 mL/min  CBC WITH DIFFERENTIAL      Result Value Ref Range   WBC 4.3  4.0 - 10.5 K/uL   RBC 4.23  3.87 - 5.11 MIL/uL   Hemoglobin 13.2  12.0 - 15.0 g/dL   HCT 32.437.7  40.136.0 - 02.746.0 %   MCV 89.1  78.0 - 100.0 fL   MCH 31.2  26.0 - 34.0 pg   MCHC 35.0  30.0 - 36.0 g/dL   RDW 25.312.0  66.411.5 - 40.315.5 %   Platelets 321  150 - 400 K/uL   Neutrophils Relative % 44  43 - 77 %   Neutro Abs 1.9  1.7 - 7.7 K/uL   Lymphocytes Relative 40  12 - 46 %   Lymphs Abs 1.7  0.7 - 4.0 K/uL   Monocytes Relative 15 (*) 3 - 12 %   Monocytes Absolute 0.7  0.1 - 1.0 K/uL   Eosinophils Relative 1  0 - 5 %   Eosinophils Absolute 0.1  0.0 - 0.7 K/uL   Basophils Relative 0  0 - 1 %   Basophils Absolute 0.0  0.0 - 0.1 K/uL   [place] Transvaginal Non-ob 04/14/2014   CLINICAL DATA:  Pelvic pain  EXAM: TRANSABDOMINAL  ULTRASOUND OF PELVIS  DOPPLER ULTRASOUND OF OVARIES  TECHNIQUE: Transabdominal ultrasound examination of the pelvis was performed including evaluation of the uterus, ovaries, adnexal regions, and pelvic cul-de-sac.  Color and duplex Doppler ultrasound was utilized to evaluate blood flow to the ovaries.  COMPARISON:  None.  FINDINGS: Uterus  Measurements: 7.5 x 3.4 x 3.4 cm. No fibroids or other mass visualized.  Endometrium  Thickness: The 0.8 mm.  No focal abnormality visualized.  Right ovary  Measurements: 2.7 x 1.2 x 1.5 cm. Normal appearance/no adnexal mass.  Left ovary  Measurements: 2.8 x 2 x 1.7 cm. There is a 10.1 x 5.7 mm hypoechoic left ovarian mass without internal Doppler flow which may represent a small hemorrhagic cyst versus endometrioma.  Pulsed Doppler evaluation demonstrates normal low-resistance arterial and venous waveforms in both ovaries.  Other findings:  There is a small amount of pelvic free fluid.  IMPRESSION: 1. No ovarian torsion. 2. Small hypoechoic 10 mm left ovarian mass which may represent a small hemorrhagic cyst versus endometrioma. Follow-up pelvic ultrasound in 6-8 weeks recommended.   Electronically Signed   By: Elige KoHetal  Patel   On: 04/14/2014 14:39    1310:  Left ovarian mass on US, no torsion; will need f/u with OB/GYN MD. Will tx for BV. GC/chlam pending. Dx and testing d/w pt.  Questions answered.  Verb understanding, agreeable to d/c home with outpt f/u.     Laray AngerKathleen M Jashiya Bassett, DO 04/16/14 1630

## 2014-04-14 NOTE — ED Notes (Signed)
Pt presents to department for evaluation of lower abdominal pain. States she was seen yesterday for same and left AMA. Now states increased pain. 8/10 upon arrival. Pt is alert and oriented x4.

## 2014-04-14 NOTE — Progress Notes (Signed)
  CARE MANAGEMENT ED NOTE 04/14/2014  Patient:  Stacey Reid,Stacey Reid   Account Number:  [id number]  Date Initiated:  04/14/2014  Documentation initiated by:  Ferdinand CavaSCHETTINO,Ilyse Tremain  Subjective/Objective Assessment:   18yo female presenting to the ED with abdominal pain     Subjective/Objective Assessment Detail:   Pt was seen at 1310.   Per pt, c/o gradual onset and persistence of constant pelvic "pain" for the past 4 days. Has been associated with vaginal discharge and "spotting." Describes the abd pain as "aching" and "throbbing."  Pt was evaluated in the ED yesterday for same and left AMA due to "family issues." Pt states she came back today "to finish the testing." She is concerned that she "might be pregnant." Denies N/V/D, no fevers, no back pain, no rash, no CP/SOB, no black or blood in stools.     Action/Plan:   Action/Plan Detail:   Anticipated DC Date:       Status Recommendation to Physician:   Result of Recommendation:  Agreed    DC Planning Services  CM consult  Other  PCP issues    Choice offered to / List presented to:  C-6 Parent          Status of service:  Completed, signed off  ED Comments:   ED Comments Detail:    spoke with patient and her mother regarding ED visit. Discussed importance of a PCP and pt's mother stated that Dr.Vang is the the pt's [place]CP [place] and the pt does still follow up with him but since it is so far away that is why they come to the ED. The pt's mother stated that she has an appointment on Monday at [facility] to discuss family Medicaid for the daughter. Provided a 5 page resource list to the pt's mother and discussed the local clinics in which she can establish a local PCP, discussed the Health Department for Pap smears, and well woman care. Discussed the benefits of establishing a PCP versus using the ED. Patient was appreciative of services.

## 2014-04-14 NOTE — Discharge Instructions (Signed)
°Emergency Department Resource Guide °1) Find a Doctor and Pay Out of Pocket °Although you won't have to find out who is covered by your insurance plan, it is a good idea to ask around and get recommendations. You will then need to call the office and see if the doctor you have chosen will accept you as a new patient and what types of options they offer for patients who are self-pay. Some doctors offer discounts or will set up payment plans for their patients who do not have insurance, but you will need to ask so you aren't surprised when you get to your appointment. ° °2) Contact Your Local Health Department °Not all health departments have doctors that can see patients for sick visits, but many do, so it is worth a call to see if yours does. If you don't know where your local health department is, you can check in your phone book. The CDC also has a tool to help you locate your state's health department, and many state websites also have listings of all of their local health departments. ° °3) Find a Walk-in Clinic °If your illness is not likely to be very severe or complicated, you may want to try a walk in clinic. These are popping up all over the country in pharmacies, drugstores, and shopping centers. They're usually staffed by nurse practitioners or physician assistants that have been trained to treat common illnesses and complaints. They're usually fairly quick and inexpensive. However, if you have serious medical issues or chronic medical problems, these are probably not your best option. ° °No Primary Care Doctor: °- Call Health Connect at  832-8000 - they can help you locate a primary care doctor that  accepts your insurance, provides certain services, etc. °- Physician Referral Service- 1-800-533-3463 ° °Chronic Pain Problems: °Organization         Address  Phone   Notes  °[facility]  (336) 297-2271 Patients need to be referred by their primary care doctor.  ° °Medication  Assistance: °Organization         Address  Phone   Notes  °[place] [facility] [address] °[place], [place] [address] (336) 641-8030 --Must be a resident of [place] °-- Must have NO insurance coverage whatsoever (no Medicaid/ Medicare, etc.) °-- The pt. MUST have a primary care doctor that directs their care regularly and follows them in the community °  °[facility]  (866) 331-1348   °[employer]  (888) 892-1162   ° °Agencies that provide inexpensive medical care: °Organization         Address  Phone   Notes  °[facility]  (336) 832-8035   °[facility]    (336) 832-7272   °[facility] [address] °[place], [place] [address] (336) 832-4777   °[facility] [address], °[place] (336) 271-4999   °[facility]    (336) 373-0678   °[facility]    (336) 272-1050   °[facility] ° [address], [place] Phone:  (336) 832-4444, Fax:  (336) 832-4440 Hours of Operation:  9 am - 6 pm, M-F.  Also accepts Medicaid/Medicare and self-pay.  °[facility] ° [address], [place] Phone: (336) 832-3150, Fax: (336) [phone]. Hours of Operation:  8:30 am - 5:30 pm, M-F.  Also accepts Medicaid and self-pay.  °[facility] [place] [address], [place] Phone: (336) 878-6027   °[facility] [address], [place], [place] (336)723-1848, Ext. 123 Mondays & Thursdays: 7-9 AM.  First 15 patients are seen on a first come, first serve basis. °  ° °Medicaid-accepting [place] Providers: ° °Organization         Address  Phone   Notes  °[facility] [address], [place] (336) 641-2100 Also accepts self-pay patients.  °[facility] [address], [place] ° (336) 856-9996   °[facility] [address], [place]  (336) 288-8857   °[facility] [address] [place] [address], [place] (336) 299-7000   °Veita Bland [address], [place]  ° (336) 373-1557 Only accepts [place] Access Medicaid patients after they have their name applied to their card.  ° °Self-Pay (no insurance) in [place]: ° °Organization         Address  Phone   Notes  °Sickle Cell Patients, [facility] [address], [place] (336) 832-1970   °[facility] [address], [place] (336) 832-4400   °[facility] ° [address], [place] (336) 992-4800   °[facility]/Dr. Osei-Bonsu ° [address] [place] [address], [place] or [address], [place] (336) 841-8500 Phone number for both [place] and [place] locations is the same.  °[facility] [address], [place] (336) 299-0000   °[facility] [address] [place] [address], [place] or [address] (336) 852-7530 °(336) 878-2260   °[facility] [address], [place] (336) 350-1642, phone; (336) 294-5005, fax Sees patients 1st and 3rd Saturday of every month.  Must not qualify for public or private insurance (i.e. Medicaid, Medicare, [place] Health Choice, Veterans' Benefits) • Household income should be no more than 200% of the poverty level •The clinic cannot treat you if you are pregnant or think you are pregnant • Sexually transmitted diseases are not treated at the clinic.  ° ° °Dental Care: °Organization         Address  Phone  Notes  °[place] [facility] [address], [place] (336) 641-6152 Accepts children up to age 21 who are enrolled in Medicaid or [place] Health Choice; pregnant women with a Medicaid card; and children who have applied for Medicaid or [place] Health Choice, but were declined, whose parents can pay a reduced fee at time of service.  °[place]  [facility] [place]  [address], [place] (336) 641-7733 Accepts children up to age 21 who are enrolled in Medicaid or [place] Health Choice; pregnant women with a Medicaid card; and children who have applied for Medicaid or [place] Health Choice, but were declined, whose parents can pay a reduced fee at time of service.  °[facility] ° [address], [place] (336) 641-4533 Patients are seen by appointment only. Walk-ins are not accepted. [facility] will see patients 18 years of age and older. °Monday - Tuesday (8am-5pm) °Most Wednesdays (8:30-5pm) °$30 per visit, cash only  °[facility] ° [address], [place] (336) 641-4533 Patients are seen by appointment only. Walk-ins are not accepted. [facility] will see patients 18 years of age and older. °One   Wednesday Evening (Monthly: Volunteer Based).  $30 per visit, cash only  °[facility]  (919) 537-3737 for adults; Children under age 4, call Graduate Pediatric Dentistry at (919) [phone]. Children aged 4-14, please call ([phone]) [phone] to request a pediatric application. ° Dental services are provided in all areas of dental care including fillings, crowns and bridges, complete and partial dentures, implants, gum treatment, root canals, and extractions. Preventive care is also provided. Treatment is provided to both adults and children. °Patients are selected via a lottery and there is often a waiting list. °  °[facility] [address], °[place] ° (336) 763-8833 [email or web address] °  °[facility] [address], [place], [place] (336)723-1848, Ext. 123 Second and Fourth Thursday of each month, opens at 6:30 AM; Clinic ends at 9 AM.  Patients are seen on a first-come first-served basis, and a limited number are seen during each clinic.  ° °[facility] ° [address], [place], [place] (336) 723-7904    Eligibility Requirements °You must have lived in [place], [place], or [place] counties for at least the last three months. °  You cannot be eligible for state or federal sponsored healthcare insurance, including Veterans Administration, Medicaid, or Medicare. °  You generally cannot be eligible for healthcare insurance through your employer.  °  How to apply: °Eligibility screenings are held every Tuesday and Wednesday afternoon from 1:00 pm until 4:00 pm. You do not need an appointment for the interview!  °[facility] [address], [place], [place] 336-631-2330   °[facility] [facility]  336-342-8273   °[place]  336-703-3100   °[facility]  336-570-6415   ° °Behavioral Health Resources in the Community: °Intensive Outpatient Programs °Organization         Address  Phone  Notes  °[facility] [address], [place], [place] 336-878-6098   °[facility] [address], [place], [place] 336-832-9800   °[facility] [address], [place], [place] ° 336-882-2125   °[facility] [address],  °[place], [place] 1-800-853-5163 or 336-641-4981   °Substance Abuse Resources °Organization         Address  Phone  Notes  °[facility]  336-882-2125   °[facility]  336-784-9470   °[facility]  336-285-9073   °[facility]  336-845-3988   °Residential & Outpatient Substance Abuse Program  1-800-659-3381   °Psychological Services °Organization         Address  Phone  Notes  °[facility]  336- 832-9600   °[facility]  336- 378-7881   °[facility] [address], [place] 1-800-853-5163 or 336-641-4981   ° °Mobile Crisis Teams °Organization         Address  Phone  Notes  °[facility], Mobile Crisis Care Unit  1-877-626-1772   °Assertive °[facility] ° [address]  [place], [place] 336-834-9664   °Sharon DeEsch [address] °[place] 336-554-5454   ° °Self-Help/Support Groups °Organization         Address  Phone             Notes  °[facility] of  - variety of support groups  336- 373-1402 Call for more information  °Narcotics Anonymous (NA), [facility] [address], °[place]  2 meetings at this location  ° °  Residential Treatment Programs Organization         Address  Phone  Notes  [facility] [address],    [place]  [phone]   [facility]  [address], [place] [phone], [place], [place] [phone]   [facility] [address], [place] [phone] Admissions: 8am-3pm M-F  [facility] [address]    [place], [place] [phone]   [facility] [address], [place], [place] [phone]   [facility] [address]  [place], [place] [phone]   [facility] Intensive Outpatient [address], [place], [place] [phone]   [facility] ([facility] [address]  [place], [place] [phone])[phone] or [phone]   [facility] ([facility]) [address] [place], [place] [phone] Accepts Medicaid  [facility] [address]  [place] [phone] Substance Abuse/Addiction Treatment   [facility] Organization         Address  Phone  Notes  [facility]  (309) 491-9799(888) ([phone])[phone]   Angie FavaJulie Brannon, PhD [address], [address] [place], [place]   ([phone](336) [phone] or 9806455142(336) [phone]   [facility]   [address] [place], [place] [phone](336) ([phone]   [facility] [address], [place], [place] [phone](336) [phone] Insurance/Medicaid/sponsorship through [facility] [address]                                    [place], [place] [phone](336) [phone] Therapy/tele-psych/case    [facility] [address]   [place], [place] [phone](336) [phone]    Dr. Lolly MustacheArfeen  [phone](336) [phone]   [facility]  [employer] [facility] 1) [address], [place] 2) [address], [place] 3)  [address], [place] [phone](336) [phone](336) [phone]  [phone](336) [phone]   [facility] Child Abuse Hotline (669) 154-0619(336) 647-809-2349 or 334-389-1931(336) [phone] (After Hours)       Take the prescriptions as directed.  Apply moist heat or ice to the area(s) of discomfort, for 15 minutes at a time, several times per day for the next few days.  Do not fall asleep on a heating or ice pack.  Call your regular OB/GYN doctor today to schedule a follow up appointment in the next 3 days.  Return to the Emergency Department immediately if worsening.

## 2014-04-15 LAB — GC/CHLAMYDIA PROBE AMP
CT PROBE, AMP APTIMA: POSITIVE — AB
GC Probe RNA: NEGATIVE

## 2014-04-16 ENCOUNTER — Inpatient Hospital Stay (HOSPITAL_COMMUNITY)
Admission: AD | Admit: 2014-04-16 | Discharge: 2014-04-16 | Disposition: A | Discharge status: Home / Self Care | Payer: No Typology Code available for payment source | Source: Ambulatory Visit | Attending: Obstetrics & Gynecology | Admitting: Obstetrics & Gynecology

## 2014-04-16 ENCOUNTER — Encounter (HOSPITAL_COMMUNITY): Payer: Self-pay | Admitting: *Deleted

## 2014-04-16 DIAGNOSIS — Z202 Contact with and (suspected) exposure to infections with a predominantly sexual mode of transmission: Secondary | ICD-10-CM

## 2014-04-16 DIAGNOSIS — A5619 Other chlamydial genitourinary infection: Secondary | ICD-10-CM | POA: Insufficient documentation

## 2014-04-16 DIAGNOSIS — N83209 Unspecified ovarian cyst, unspecified side: Secondary | ICD-10-CM

## 2014-04-16 DIAGNOSIS — N838 Other noninflammatory disorders of ovary, fallopian tube and broad ligament: Secondary | ICD-10-CM | POA: Insufficient documentation

## 2014-04-16 DIAGNOSIS — N898 Other specified noninflammatory disorders of vagina: Secondary | ICD-10-CM | POA: Insufficient documentation

## 2014-04-16 DIAGNOSIS — N739 Female pelvic inflammatory disease, unspecified: Secondary | ICD-10-CM | POA: Insufficient documentation

## 2014-04-16 DIAGNOSIS — R109 Unspecified abdominal pain: Secondary | ICD-10-CM | POA: Insufficient documentation

## 2014-04-16 LAB — URINALYSIS, ROUTINE W REFLEX MICROSCOPIC
Bilirubin Urine: NEGATIVE
GLUCOSE, UA: NEGATIVE mg/dL
KETONES UR: NEGATIVE mg/dL
Nitrite: NEGATIVE
Protein, ur: 30 mg/dL — AB
Specific Gravity, Urine: 1.015 (ref 1.005–1.030)
UROBILINOGEN UA: 0.2 mg/dL (ref 0.0–1.0)
pH: 7 (ref 5.0–8.0)

## 2014-04-16 LAB — URINE MICROSCOPIC-ADD ON

## 2014-04-16 MED ORDER — KETOROLAC TROMETHAMINE 60 MG/2ML IM SOLN
60.0000 mg | Freq: Once | INTRAMUSCULAR | Status: AC
  Administered 2014-04-16: 60 mg via INTRAMUSCULAR
  Filled 2014-04-16: qty 2

## 2014-04-16 MED ORDER — AZITHROMYCIN 1 G PO PACK
1.0000 g | PACK | Freq: Once | ORAL | Status: AC
  Administered 2014-04-16: 1 g via ORAL
  Filled 2014-04-16: qty 1

## 2014-04-16 MED ORDER — ONDANSETRON 4 MG PO TBDP
4.0000 mg | ORAL_TABLET | Freq: Once | ORAL | Status: AC
Start: 2014-04-16 — End: 2014-04-16
  Administered 2014-04-16: 4 mg via ORAL
  Filled 2014-04-16: qty 1

## 2014-04-16 MED ORDER — IBUPROFEN 600 MG PO TABS: 600.0000 mg | ORAL_TABLET | Freq: Four times a day (QID) | ORAL | Status: DC | PRN

## 2014-04-16 NOTE — MAU Note (Signed)
Dx with ovarian cyst a few days ago.   No prior hx.  States pain is getting worse. ? Constipation side effect. Pain in lower abd around to back and down left side. No bleeding. " chunky discharge", some burning with urination.

## 2014-04-16 NOTE — Discharge Instructions (Signed)

## 2014-04-16 NOTE — Telephone Encounter (Signed)
+  Chlamydia. Chart sent to EDP office for review. DHHS attached. 

## 2014-04-16 NOTE — MAU Provider Note (Signed)
History     CSN: [id number]  Arrival date and time: 04/16/14 0865   First Provider Initiated Contact with Patient 04/16/14 1017      Chief Complaint  Patient presents with  . Abdominal Pain  . Vaginal Discharge   HPI  Stacey Reid is 18 y.o. female who presents with abdominal pain and abnormal vaginal discharge.  She was seen recently at [facility] and told she had an ovarian cyst on her left ovary. She was told to follow up at the clinic and patient is scheduled in May for a follow up. The patient is here because she is concerned about her discharge and because she doesn't feel like she is managing her pain at home related to cyst. [facility] Gainer gave her Hydrocodone for pain; the patient has not taken anything for pain today. Yesterday she took 1 hydrocodone for pain.   The patient spoke to a case manager on 4/22 due to frequent ER room visits.    OB History   Grav Para Term Preterm Abortions TAB SAB Ect Mult Living                  History reviewed. No pertinent past medical history.  History reviewed. No pertinent past surgical history.  History reviewed. No pertinent family history.  History  Substance Use Topics  . Smoking status: Never Smoker   . Smokeless tobacco: Not on file  . Alcohol Use: No    Allergies: No Known Allergies  Prescriptions prior to admission  Medication Sig Dispense Refill  . HYDROcodone-acetaminophen (NORCO/VICODIN) 5-325 MG per tablet Take 1 tablet by mouth every 6 (six) hours as needed for moderate pain or severe pain.      . metroNIDAZOLE (FLAGYL) 500 MG tablet Take 1 tablet (500 mg total) by mouth 2 (two) times daily.  14 tablet  0   Results for orders placed during the hospital encounter of 04/16/14 (from the past 48 hour(s))  URINALYSIS, ROUTINE W REFLEX MICROSCOPIC     Status: Abnormal   Collection Time    04/16/14  7:40 AM      Result Value Ref Range   Color, Urine YELLOW  YELLOW   APPearance CLEAR  CLEAR    Specific Gravity, Urine 1.015  1.005 - 1.030   pH 7.0  5.0 - 8.0   Glucose, UA NEGATIVE  NEGATIVE mg/dL   Hgb urine dipstick MODERATE (*) NEGATIVE   Bilirubin Urine NEGATIVE  NEGATIVE   Ketones, ur NEGATIVE  NEGATIVE mg/dL   Protein, ur 30 (*) NEGATIVE mg/dL   Urobilinogen, UA 0.2  0.0 - 1.0 mg/dL   Nitrite NEGATIVE  NEGATIVE   Leukocytes, UA TRACE (*) NEGATIVE  URINE MICROSCOPIC-ADD ON     Status: Abnormal   Collection Time    04/16/14  7:40 AM      Result Value Ref Range   Squamous Epithelial / LPF RARE  RARE   WBC, UA 21-50  <3 WBC/hpf   RBC / HPF 3-6  <3 RBC/hpf   Bacteria, UA FEW (*) RARE    US Transvaginal Non-ob  04/14/2014   CLINICAL DATA:  Pelvic pain  EXAM: TRANSABDOMINAL ULTRASOUND OF PELVIS  DOPPLER ULTRASOUND OF OVARIES  TECHNIQUE: Transabdominal ultrasound examination of the pelvis was performed including evaluation of the uterus, ovaries, adnexal regions, and pelvic cul-de-sac.  Color and duplex Doppler ultrasound was utilized to evaluate blood flow to the ovaries.  COMPARISON:  None.  FINDINGS: Uterus  Measurements: 7.5 x  3.4 x 3.4 cm. No fibroids or other mass visualized.  Endometrium  Thickness: The 0.8 mm.  No focal abnormality visualized.  Right ovary  Measurements: 2.7 x 1.2 x 1.5 cm. Normal appearance/no adnexal mass.  Left ovary  Measurements: 2.8 x 2 x 1.7 cm. There is a 10.1 x 5.7 mm hypoechoic left ovarian mass without internal Doppler flow which may represent a small hemorrhagic cyst versus endometrioma.  Pulsed Doppler evaluation demonstrates normal low-resistance arterial and venous waveforms in both ovaries.  Other findings:  There is a small amount of pelvic free fluid.  IMPRESSION: 1. No ovarian torsion. 2. Small hypoechoic 10 mm left ovarian mass which may represent a small hemorrhagic cyst versus endometrioma. Follow-up pelvic ultrasound in 6-8 weeks recommended.   Electronically Signed   By: Elige KoHetal  Patel   On: 04/14/2014 14:39   [place] Pelvis  Complete  04/14/2014   CLINICAL DATA:  Pelvic pain  EXAM: TRANSABDOMINAL ULTRASOUND OF PELVIS  DOPPLER ULTRASOUND OF OVARIES  TECHNIQUE: Transabdominal ultrasound examination of the pelvis was performed including evaluation of the uterus, ovaries, adnexal regions, and pelvic cul-de-sac.  Color and duplex Doppler ultrasound was utilized to evaluate blood flow to the ovaries.  COMPARISON:  None.  FINDINGS: Uterus  Measurements: 7.5 x 3.4 x 3.4 cm. No fibroids or other mass visualized.  Endometrium  Thickness: The 0.8 mm.  No focal abnormality visualized.  Right ovary  Measurements: 2.7 x 1.2 x 1.5 cm. Normal appearance/no adnexal mass.  Left ovary  Measurements: 2.8 x 2 x 1.7 cm. There is a 10.1 x 5.7 mm hypoechoic left ovarian mass without internal Doppler flow which may represent a small hemorrhagic cyst versus endometrioma.  Pulsed Doppler evaluation demonstrates normal low-resistance arterial and venous waveforms in both ovaries.  Other findings:  There is a small amount of pelvic free fluid.  IMPRESSION: 1. No ovarian torsion. 2. Small hypoechoic 10 mm left ovarian mass which may represent a small hemorrhagic cyst versus endometrioma. Follow-up pelvic ultrasound in 6-8 weeks recommended.   Electronically Signed   By: Elige KoHetal  Patel   On: 04/14/2014 14:39   [place] Art/ven Flow Abd Pelv Doppler  04/14/2014   CLINICAL DATA:  Pelvic pain  EXAM: TRANSABDOMINAL ULTRASOUND OF PELVIS  DOPPLER ULTRASOUND OF OVARIES  TECHNIQUE: Transabdominal ultrasound examination of the pelvis was performed including evaluation of the uterus, ovaries, adnexal regions, and pelvic cul-de-sac.  Color and duplex Doppler ultrasound was utilized to evaluate blood flow to the ovaries.  COMPARISON:  None.  FINDINGS: Uterus  Measurements: 7.5 x 3.4 x 3.4 cm. No fibroids or other mass visualized.  Endometrium  Thickness: The 0.8 mm.  No focal abnormality visualized.  Right ovary  Measurements: 2.7 x 1.2 x 1.5 cm. Normal appearance/no adnexal mass.   Left ovary  Measurements: 2.8 x 2 x 1.7 cm. There is a 10.1 x 5.7 mm hypoechoic left ovarian mass without internal Doppler flow which may represent a small hemorrhagic cyst versus endometrioma.  Pulsed Doppler evaluation demonstrates normal low-resistance arterial and venous waveforms in both ovaries.  Other findings:  There is a small amount of pelvic free fluid.  IMPRESSION: 1. No ovarian torsion. 2. Small hypoechoic 10 mm left ovarian mass which may represent a small hemorrhagic cyst versus endometrioma. Follow-up pelvic ultrasound in 6-8 weeks recommended.   Electronically Signed   By: Elige KoHetal  Patel   On: 04/14/2014 14:39    Review of Systems  Gastrointestinal: Positive for abdominal pain (Left lower quadrant pain. ).  Genitourinary:  Negative for dysuria, urgency, frequency, hematuria and flank pain.   Physical Exam   Blood pressure 113/72, pulse 82, temperature 98.3 F (36.8 C), temperature source Oral, resp. rate 16, last menstrual period 04/10/2014, SpO2 100.00%.  Physical Exam  Constitutional: She is oriented to person, place, and time. She appears well-developed and well-nourished. No distress.  HENT:  Head: Normocephalic.  Eyes: Pupils are equal, round, and reactive to light.  Neck: Neck supple.  Respiratory: Effort normal.  GI: Soft. She exhibits no distension. There is no tenderness. There is no rebound.  Musculoskeletal: Normal range of motion.  Neurological: She is alert and oriented to person, place, and time.  Skin: Skin is warm. She is not diaphoretic.  Psychiatric: Her behavior is normal.    MAU Course  Procedures None  MDM Toradol 60 mg IM  Azithromycin 1 gram PO in MAU for the treatment of Chlamydia.  June 5th follow up appt in the clinic. I had her appt changed in order to schedule a follow up US prior to the visit. Patient made aware.  Patient has a follow up US scheduled for June 2 at 0845 1058: patient vomited Azithromycin in MAU Zofran 4 mg ordered.   Verified partners D.O.B and allergies. RX provided to him for treatment of chlamydia; Azithromycin 1 gram PO. No intercourse for 7-10 days.  Patient tolerated second dose of azithromycin PO Patient rates her pain 2/10 at discharge.   Assessment and Plan   A:  Left ovarian mass Chlamydia (recurrent)   P:  June 2 follow up US for to evaluate ovarian mass June 5 @ 0900 follow up GYN clinic. RX: Ibuprofen 600 mg PO Q 6 hours for pain Continued hydrocodone as needed for pain No intercourse for 7-10 days; condoms always.   Stacey HansenJennifer Irene Lailana Shira, NP  04/16/2014, 10:18 AM

## 2014-04-17 NOTE — MAU Provider Note (Signed)
Attestation of Attending Supervision of Advanced Practitioner (PA/CNM/NP): Evaluation and management procedures were performed by the Advanced Practitioner under my supervision and collaboration.  I have reviewed the Advanced Practitioner's note and chart, and I agree with the management and plan.  Anie Juniel, MD, FACOG Attending Obstetrician & Gynecologist Faculty Practice, [facility]  

## 2014-04-20 NOTE — ED Provider Notes (Signed)
Culture taken on 04/14/14 positive for chlamydia and chart shows recurrent chlamydia infections.  Pt was [facility] she [facility] for her Chlamydia with Azithromycin 1g PO.  No further treatment is needed and pt is to f/u with her GYN as directed.    Stacey ClientHannah Clester Chlebowski, PA-C 04/20/14 1121

## 2014-04-26 NOTE — Telephone Encounter (Signed)
Chart returned from EDP office. Per Dahlia ClientHannah Muthersbaugh PA-C, patient was seen in MAU on 04/16/14 and treated with Azithromycin for +Chlamydia. No further treatment needed. Patient notified of +Chlamydia at MAU.

## 2014-05-12 ENCOUNTER — Encounter: Payer: Self-pay | Admitting: Advanced Practice Midwife

## 2014-05-21 ENCOUNTER — Emergency Department (HOSPITAL_COMMUNITY)
Admission: EM | Admit: 2014-05-21 | Discharge: 2014-05-21 | Discharge status: Against Medical Advice | Payer: Medicaid Other | Attending: Emergency Medicine | Admitting: Emergency Medicine

## 2014-05-21 ENCOUNTER — Encounter (HOSPITAL_COMMUNITY): Payer: Self-pay | Admitting: Emergency Medicine

## 2014-05-21 DIAGNOSIS — N939 Abnormal uterine and vaginal bleeding, unspecified: Secondary | ICD-10-CM

## 2014-05-21 DIAGNOSIS — N898 Other specified noninflammatory disorders of vagina: Secondary | ICD-10-CM | POA: Insufficient documentation

## 2014-05-21 LAB — URINALYSIS, ROUTINE W REFLEX MICROSCOPIC
Bilirubin Urine: NEGATIVE
Glucose, UA: NEGATIVE mg/dL
Ketones, ur: NEGATIVE mg/dL
NITRITE: NEGATIVE
Protein, ur: NEGATIVE mg/dL
SPECIFIC GRAVITY, URINE: 1.016 (ref 1.005–1.030)
UROBILINOGEN UA: 1 mg/dL (ref 0.0–1.0)
pH: 8 (ref 5.0–8.0)

## 2014-05-21 LAB — URINE MICROSCOPIC-ADD ON

## 2014-05-21 LAB — PREGNANCY, URINE: PREG TEST UR: NEGATIVE

## 2014-05-21 NOTE — ED Notes (Signed)
Pt reports abnormal vaginal spotting this week, twice in one week, started again this am. States diagnosed with ovarian cyst 6 weeks ago. LMP May 15. Has ultrasound scheduled June 2.

## 2014-05-21 NOTE — ED Notes (Signed)
No answer x2 

## 2014-05-22 NOTE — ED Provider Notes (Signed)
Medical screening examination/treatment/procedure(s) were performed by non-physician practitioner and as supervising physician I was immediately available for consultation/collaboration.  Flint Melter, MD 05/22/14 [phone]

## 2014-05-22 NOTE — ED Provider Notes (Signed)
Pt left before being seen  Stacey Helper, PA-C 05/22/14 1457

## 2014-05-25 ENCOUNTER — Other Ambulatory Visit (HOSPITAL_COMMUNITY): Payer: Self-pay | Admitting: Obstetrics and Gynecology

## 2014-05-25 ENCOUNTER — Ambulatory Visit (HOSPITAL_COMMUNITY)
Admit: 2014-05-25 | Discharge: 2014-05-25 | Disposition: A | Discharge status: Home / Self Care | Payer: Medicaid Other | Attending: Obstetrics and Gynecology | Admitting: Obstetrics and Gynecology

## 2014-05-25 DIAGNOSIS — N83209 Unspecified ovarian cyst, unspecified side: Secondary | ICD-10-CM

## 2014-05-28 ENCOUNTER — Ambulatory Visit (INDEPENDENT_AMBULATORY_CARE_PROVIDER_SITE_OTHER): Payer: Self-pay | Admitting: Medical

## 2014-05-28 ENCOUNTER — Encounter: Payer: Self-pay | Admitting: Medical

## 2014-05-28 VITALS — BP 118/77 | HR 70 | Temp 97.9°F | Ht 63.0 in | Wt 125.8 lb

## 2014-05-28 DIAGNOSIS — N83209 Unspecified ovarian cyst, unspecified side: Secondary | ICD-10-CM

## 2014-05-28 NOTE — Patient Instructions (Addendum)
Ovarian Cyst An ovarian cyst is a sac filled with fluid or blood. This sac is attached to the ovary. Some cysts go away on their own. Other cysts need treatment.  HOME CARE   Only take medicine as told by your doctor.  Follow up with your doctor as told.  Get regular pelvic exams and Pap tests. GET HELP IF:  Your periods are late, not regular, or painful.  You stop having periods.  Your belly (abdominal) or pelvic pain does not go away.  Your belly becomes large or puffy (swollen).  You have a hard time peeing (totally emptying your bladder).  You have pressure on your bladder.  You have pain during sex.  You feel fullness, pressure, or discomfort in your belly.  You lose weight for no reason.  You feel sick most of the time.  You have a hard time pooping (constipation).  You do not feel like eating.  You develop pimples (acne).  You have an increase in hair on your body and face.  You are gaining weight for no reason.  You think you are pregnant. GET HELP RIGHT AWAY IF:   Your belly pain gets worse.  You feel sick to your stomach (nauseous), and you throw up (vomit).  You have a fever that comes on fast.  You have belly pain while pooping (bowel movement).  Your periods are heavier than usual. MAKE SURE YOU:   Understand these instructions.  Will watch your condition.  Will get help right away if you are not doing well or get worse. Document Released: 05/28/2008 Document Revised: 09/30/2013 Document Reviewed: 08/17/2013 [facility] Patient Information 2014 [place]. Back Exercises Back exercises help treat and prevent back injuries. The goal is to increase your strength in your belly (abdominal) and back muscles. These exercises can also help with flexibility. Start these exercises when told by your doctor. HOME CARE Back exercises include: Pelvic Tilt.  Lie on your back with your knees bent. Tilt your pelvis until the lower part of your back  is against the floor. Hold this position 5 to 10 sec. Repeat this exercise 5 to 10 times. Knee to Chest.  Pull 1 knee up against your chest and hold for 20 to 30 seconds. Repeat this with the other knee. This may be done with the other leg straight or bent, whichever feels better. Then, pull both knees up against your chest. Sit-Ups or Curl-Ups.  Bend your knees 90 degrees. Start with tilting your pelvis, and do a partial, slow sit-up. Only lift your upper half 30 to 45 degrees off the floor. Take at least 2 to 3 seonds for each sit-up. Do not do sit-ups with your knees out straight. If partial sit-ups are difficult, simply do the above but with only tightening your belly (abdominal) muscles and holding it as told. Hip-Lift.  Lie on your back with your knees flexed 90 degrees. Push down with your feet and shoulders as you raise your hips 2 inches off the floor. Hold for 10 seconds, repeat 5 to 10 times. Back Arches.  Lie on your stomach. Prop yourself up on bent elbows. Slowly press on your hands, causing an arch in your low back. Repeat 3 to 5 times. Shoulder-Lifts.  Lie face down with arms beside your body. Keep hips and belly pressed to floor as you slowly lift your head and shoulders off the floor. Do not overdo your exercises. Be careful in the beginning. Exercises may cause you some mild back  discomfort. If the pain lasts for more than 15 minutes, stop the exercises until you see your doctor. Improvement with exercise for back problems is slow.  Document Released: 01/12/2011 Document Revised: 03/03/2012 Document Reviewed: 10/11/2011 [facility] Patient Information 2014 [place], [place]. Contraception Choices Birth control (contraception) is the use of any methods or devices to stop pregnancy from happening. Below are some methods to help avoid pregnancy. HORMONAL BIRTH CONTROL  A small tube put under the skin of the upper arm (implant). The tube can stay in place for 3 years. The implant  must be taken out after 3 years.  Shots given every 3 months.  Pills taken every day.  Patches that are changed once a week.  A ring put into the vagina (vaginal ring). The ring is left in place for 3 weeks and removed for 1 week. Then, a new ring is put in the vagina.  Emergency birth control pills taken after unprotected sex (intercourse). BARRIER BIRTH CONTROL   A thin covering worn on the penis (female condom) during sex.  A soft, loose covering put into the vagina (female condom) before sex.  A rubber bowl that sits over the cervix (diaphragm). The bowl must be made for you. The bowl is put into the vagina before sex. The bowl is left in place for 6 to 8 hours after sex.  A small, soft cup that fits over the cervix (cervical cap). The cup must be made for you. The cup can be left in place for 48 hours after sex.  A sponge that is put into the vagina before sex.  A chemical that kills or stops sperm from getting into the cervix and uterus (spermicide). The chemical may be a cream, jelly, foam, or pill. INTRAUTERINE (IUD) BIRTH CONTROL   IUD birth control is a small, T-shaped piece of plastic. The plastic is put inside the uterus. There are 2 types of IUD:  Copper IUD. The IUD is covered in copper wire. The copper makes a fluid that kills sperm. It can stay in place for 10 years.  Hormone IUD. The hormone stops pregnancy from happening. It can stay in place for 5 years. PERMANENT METHODS  When the woman has her fallopian tubes sealed, tied, or blocked during surgery. This stops the egg from traveling to the uterus.  The doctor places a small coil or insert into each fallopian tube. This causes scar tissue to form and blocks the fallopian tubes.  When the female has the tubes that carry sperm tied off (vasectomy). NATURAL FAMILY PLANNING BIRTH CONTROL   Natural family planning means not having sex or using barrier birth control on the days the woman could become pregnant.  Use  a calendar to keep track of the length of each period and know the days she can get pregnant.  Avoid sex during ovulation.  Use a thermometer to measure body temperature. Also watch for symptoms of ovulation.  Time sex to be after the woman has ovulated. Use condoms to help protect yourself against sexually transmitted infections (STIs). Do this no matter what type of birth control you use. Talk to your doctor about which type of birth control is best for you. Document Released: 10/07/2009 Document Revised: 08/12/2013 Document Reviewed: 07/01/2013 [facility] Patient Information 2014 [place], [place].

## 2014-05-28 NOTE — Progress Notes (Signed)
Patient ID: Stacey Reid, female   DOB: June 13, 1996, 18 y.o.   MRN: [id number]  History:  Stacey Reid is a 18 y.o. [id number] who presents to clinic today for follow-up for ovarian cyst. The patient was seen in ED and MAU for pelvic pain in April. US showed a small hemorrhagic ovarian cyst. The patient was scheduled for a follow-up US on 05/25/14 which showed that cyst was resolving. Patient states resolution of pain. She denies irregular vaginal bleeding or vaginal discharge or fever.   The following portions of the patient's history were reviewed and updated as appropriate: allergies, current medications, past family history, past medical history, past social history, past surgical history and problem list.  Review of Systems:  Pertinent items are noted in HPI.  Objective:  Physical Exam BP 118/77  Pulse 70  Temp(Src) 97.9 F (36.6 C) (Oral)  Ht 5\' 3"  (1.6 m)  Wt 125 lb 12.8 oz (57.063 kg)  BMI 22.29 kg/m2  LMP 05/07/2014 GENERAL: Well-developed, well-nourished female in no acute distress.  HEENT: Normocephalic, atraumatic.  LUNGS: Normal effort HEART: Regular rate   ABDOMEN: Soft, nontender, nondistended. No organomegaly. Normal bowel sounds appreciated in all quadrants.  PELVIC: deferred MSK: mild tenderness to palpation of the lumbar back bilaterally just above the sacrum.  EXTREMITIES: No cyanosis, clubbing, or edema   Labs and Imaging US Transvaginal Non-ob  05/25/2014   CLINICAL DATA:  Followup of ovarian cyst.  EXAM: TRANSVAGINAL ULTRASOUND OF PELVIS  TECHNIQUE: Transvaginal ultrasound examination of the pelvis was performed including evaluation of the uterus, ovaries, adnexal regions, and pelvic cul-de-sac.  Both transabdominal and transvaginal ultrasound examinations of the pelvis were performed. Transabdominal technique was performed for global imaging of the pelvis including uterus, ovaries, adnexal regions, and pelvic cul-de-sac. It was necessary to proceed  with endovaginal exam following the transabdominal exam to visualize the uterus, ovaries, and adnexa.  COMPARISON:  04/14/2014  FINDINGS: Uterus:  7.9 x 3.5 x 3.9 cm.  Normal in morphology.  Endometrium:  Normal, 6 mm.  Right Ovary:  2.7 x 1.6 x 1.9 cm.  Normal in morphology.  Left Ovary: 2.6 x 1.6 x 1.7 cm. Normal in morphology. Resolution of probable complex follicle since the prior exam.  Other Findings:  Trace free pelvic fluid is likely physiologic.  IMPRESSION: Resolution of left ovarian lesion which was likely a hemorrhagic follicle.  Normal pelvic ultrasound for age.   Electronically Signed   By: Jeronimo Greaves M.D.   On: 05/25/2014 09:19    Assessment & Plan:  Assessment: Ovarian cyst, resolved Birth Control counseling Lower back muscle strain  Plans: Discussed all types of birth control with patient today. Medicaid is pending and she would like to take some time to decide.  Advised condom use until birth control is initiated Patient given information on AVS about all types of birth control and back exercises Advised ibuprofen, ice/heat and moderation of activity for back pain Patient can follow-up with PCP if symptoms persist or worsen Patient will call [facility] for an appointment when she has decided what type of birth control she would prefer Patient may return to [facility] otherwise  Freddi Starr, PA-C 05/28/2014 9:24 AM

## 2014-07-30 ENCOUNTER — Inpatient Hospital Stay (HOSPITAL_COMMUNITY)
Admission: AD | Admit: 2014-07-30 | Discharge: 2014-07-30 | Disposition: A | Discharge status: Home / Self Care | Payer: Medicaid Other | Source: Ambulatory Visit | Attending: Obstetrics & Gynecology | Admitting: Obstetrics & Gynecology

## 2014-07-30 ENCOUNTER — Encounter (HOSPITAL_COMMUNITY): Payer: Self-pay

## 2014-07-30 ENCOUNTER — Inpatient Hospital Stay (HOSPITAL_COMMUNITY): Payer: Medicaid Other

## 2014-07-30 DIAGNOSIS — N760 Acute vaginitis: Secondary | ICD-10-CM

## 2014-07-30 DIAGNOSIS — Z3200 Encounter for pregnancy test, result unknown: Secondary | ICD-10-CM | POA: Diagnosis present

## 2014-07-30 DIAGNOSIS — B9689 Other specified bacterial agents as the cause of diseases classified elsewhere: Secondary | ICD-10-CM

## 2014-07-30 DIAGNOSIS — Z3201 Encounter for pregnancy test, result positive: Secondary | ICD-10-CM | POA: Insufficient documentation

## 2014-07-30 DIAGNOSIS — R109 Unspecified abdominal pain: Secondary | ICD-10-CM

## 2014-07-30 DIAGNOSIS — O26899 Other specified pregnancy related conditions, unspecified trimester: Secondary | ICD-10-CM

## 2014-07-30 DIAGNOSIS — N898 Other specified noninflammatory disorders of vagina: Secondary | ICD-10-CM | POA: Diagnosis not present

## 2014-07-30 HISTORY — DX: Unspecified asthma, uncomplicated: J45.909

## 2014-07-30 LAB — WET PREP, GENITAL
TRICH WET PREP: NONE SEEN
Yeast Wet Prep HPF POC: NONE SEEN

## 2014-07-30 LAB — CBC
HCT: 35.3 % — ABNORMAL LOW (ref 36.0–46.0)
Hemoglobin: 12.4 g/dL (ref 12.0–15.0)
MCH: 31.1 pg (ref 26.0–34.0)
MCHC: 35.1 g/dL (ref 30.0–36.0)
MCV: 88.5 fL (ref 78.0–100.0)
Platelets: 317 10*3/uL (ref 150–400)
RBC: 3.99 MIL/uL (ref 3.87–5.11)
RDW: 12.2 % (ref 11.5–15.5)
WBC: 4.6 10*3/uL (ref 4.0–10.5)

## 2014-07-30 LAB — URINALYSIS, ROUTINE W REFLEX MICROSCOPIC
BILIRUBIN URINE: NEGATIVE
GLUCOSE, UA: NEGATIVE mg/dL
HGB URINE DIPSTICK: NEGATIVE
Ketones, ur: NEGATIVE mg/dL
Leukocytes, UA: NEGATIVE
Nitrite: NEGATIVE
PH: 6.5 (ref 5.0–8.0)
Protein, ur: NEGATIVE mg/dL
Specific Gravity, Urine: 1.01 (ref 1.005–1.030)
Urobilinogen, UA: 0.2 mg/dL (ref 0.0–1.0)

## 2014-07-30 LAB — HCG, QUANTITATIVE, PREGNANCY: HCG, BETA CHAIN, QUANT, S: 170 m[IU]/mL — AB (ref <5)

## 2014-07-30 LAB — POCT PREGNANCY, URINE: Preg Test, Ur: POSITIVE — AB

## 2014-07-30 MED ORDER — METRONIDAZOLE 500 MG PO TABS: 500.0000 mg | ORAL_TABLET | Freq: Two times a day (BID) | ORAL | Status: DC

## 2014-07-30 MED ORDER — METRONIDAZOLE 0.75 % VA GEL: 1.0000 | Freq: Every day | VAGINAL | Status: DC

## 2014-07-30 NOTE — Discharge Instructions (Signed)
Abdominal Pain During Pregnancy °Belly (abdominal) pain is common during pregnancy. Most of the time, it is not a serious problem. Other times, it can be a sign that something is wrong with the pregnancy. Always tell your doctor if you have belly pain. °HOME CARE °Monitor your belly pain for any changes. The following actions may help you feel better: °· Do not have sex (intercourse) or put anything in your vagina until you feel better. °· Rest until your pain stops. °· Drink clear fluids if you feel sick to your stomach (nauseous). Do not eat solid food until you feel better. °· Only take medicine as told by your doctor. °· Keep all doctor visits as told. °GET HELP RIGHT AWAY IF:  °· You are bleeding, leaking fluid, or pieces of tissue come out of your vagina. °· You have more pain or cramping. °· You keep throwing up (vomiting). °· You have pain when you pee (urinate) or have blood in your pee. °· You have a fever. °· You do not feel your baby moving as much. °· You feel very weak or feel like passing out. °· You have trouble breathing, with or without belly pain. °· You have a very bad headache and belly pain. °· You have fluid leaking from your vagina and belly pain. °· You keep having watery poop (diarrhea). °· Your belly pain does not go away after resting, or the pain gets worse. °MAKE SURE YOU:  °· Understand these instructions. °· Will watch your condition. °· Will get help right away if you are not doing well or get worse. °Document Released: 11/28/2009 Document Revised: 08/12/2013 Document Reviewed: 07/09/2013 °ExitCare® Patient Information ©2015 [employer]. This information is not intended to replace advice given to you by your health care provider. Make sure you discuss any questions you have with your health care provider. °Bacterial Vaginosis °Bacterial vaginosis is a vaginal infection that occurs when the normal balance of bacteria in the vagina is disrupted. It results from an overgrowth of  certain bacteria. This is the most common vaginal infection in women of childbearing age. Treatment is important to prevent complications, especially in pregnant women, as it can cause a premature delivery. °CAUSES  °Bacterial vaginosis is caused by an increase in harmful bacteria that are normally present in smaller amounts in the vagina. Several different kinds of bacteria can cause bacterial vaginosis. However, the reason that the condition develops is not fully understood. °RISK FACTORS °Certain activities or behaviors can put you at an increased risk of developing bacterial vaginosis, including: °· Having a new sex partner or multiple sex partners. °· Douching. °· Using an intrauterine device (IUD) for contraception. °Women do not get bacterial vaginosis from toilet seats, bedding, swimming pools, or contact with objects around them. °SIGNS AND SYMPTOMS  °Some women with bacterial vaginosis have no signs or symptoms. Common symptoms include: °· Grey vaginal discharge. °· A fishlike odor with discharge, especially after sexual intercourse. °· Itching or burning of the vagina and vulva. °· Burning or pain with urination. °DIAGNOSIS  °Your health care provider will take a medical history and examine the vagina for signs of bacterial vaginosis. A sample of vaginal fluid may be taken. Your health care provider will look at this sample under a microscope to check for bacteria and abnormal cells. A vaginal pH test may also be done.  °TREATMENT  °Bacterial vaginosis may be treated with antibiotic medicines. These may be given in the form of a pill or a vaginal   cream. A second round of antibiotics may be prescribed if the condition comes back after treatment.  °HOME CARE INSTRUCTIONS  °· Only take over-the-counter or prescription medicines as directed by your health care provider. °· If antibiotic medicine was prescribed, take it as directed. Make sure you finish it even if you start to feel better. °· Do not have sex  until treatment is completed. °· Tell all sexual partners that you have a vaginal infection. They should see their health care provider and be treated if they have problems, such as a mild rash or itching. °· Practice safe sex by using condoms and only having one sex partner. °SEEK MEDICAL CARE IF:  °· Your symptoms are not improving after 3 days of treatment. °· You have increased discharge or pain. °· You have a fever. °MAKE SURE YOU:  °· Understand these instructions. °· Will watch your condition. °· Will get help right away if you are not doing well or get worse. °FOR MORE INFORMATION  °Centers for Disease Control and Prevention, Division of STD Prevention: www.cdc.gov/std °American Sexual Health Association (ASHA): www.ashastd.org  °Document Released: 12/10/2005 Document Revised: 09/30/2013 Document Reviewed: 07/22/2013 °ExitCare® Patient Information ©2015 [employer]. This information is not intended to replace advice given to you by your health care provider. Make sure you discuss any questions you have with your health care provider. ° °

## 2014-07-30 NOTE — ED Provider Notes (Deleted)
History     CSN: [id number]  Arrival date and time: 07/30/14 4098   First Provider Initiated Contact with Patient 07/30/14 0936     Stacey Reid, [id number] is a 18 year old African American female and was admitted to the Maternal Admission Unit for a positive at home pregnancy test and vaginal discharge. On 07/25/2014, the patient began starting feeling fatigued and nauseated and thought it was caused by her menstrual period. As the week progressed, she decided to purchase a pregnancy test. The at home pregnancy test was positive.   Patient rates her discomfort as a 1 out of 10 on the severity. Patients endorses a copius amount of clear vaginal discharge for the past few days. Patient has a past medical history of bacterial vaginosis. Her last menstrual period 07/02/2014. Patient endorses shortness of breath, vomiting, chills, polyruia, breast tenderness.  Patient has 1 partner, her boyfriend and usually practices protected vaginal sex. Patient denies tobacco, alcohol, and drug use. She is currently not taking any medications and/or has allergies.   Chief Complaint  Patient presents with  . Possible Pregnancy  . Abdominal Pain  . Vaginal Discharge   Patient is a 18 y.o. female presenting with pregnancy problem, abdominal pain, and vaginal discharge. The history is provided by the patient. No language interpreter was used.  Possible Pregnancy This is a new problem. The current episode started in the past 7 days. The problem occurs constantly. The problem has been unchanged. Associated symptoms include abdominal pain, anorexia, chills, fatigue, vomiting and weakness. Pertinent negatives include no chest pain, congestion, coughing, diaphoresis, fever or visual change. Nothing aggravates the symptoms. She has tried nothing for the symptoms. The treatment provided no relief.  Abdominal Pain The primary symptoms of the illness include abdominal pain, fatigue, shortness of breath, vomiting and vaginal  discharge. The primary symptoms of the illness do not include fever.  Additional symptoms associated with the illness include chills, anorexia, urgency and frequency. Symptoms associated with the illness do not include diaphoresis.  Vaginal Discharge Associated symptoms include abdominal pain, anorexia, chills, fatigue, vomiting and weakness. Pertinent negatives include no chest pain, congestion, coughing, diaphoresis, fever or visual change.      Past Medical History  Diagnosis Date  . Asthma     History reviewed. No pertinent past surgical history.  History reviewed. No pertinent family history.  History  Substance Use Topics  . Smoking status: Never Smoker   . Smokeless tobacco: Not on file  . Alcohol Use: No    Allergies: No Known Allergies  No prescriptions prior to admission    Review of Systems  Constitutional: Positive for chills and fatigue. Negative for fever and diaphoresis.  HENT: Negative for congestion.   Eyes: Negative for blurred vision and double vision.  Respiratory: Positive for shortness of breath. Negative for cough.   Cardiovascular: Negative for chest pain.  Gastrointestinal: Positive for vomiting, abdominal pain and anorexia.  Genitourinary: Positive for urgency, frequency and vaginal discharge.  Neurological: Positive for weakness. Negative for dizziness.  Psychiatric/Behavioral: Negative for depression.   Physical Exam  BP 123/69  Pulse 91  Resp 18  Ht 5\' 3"  (1.6 m)  Wt 58.06 kg (128 lb)  BMI 22.68 kg/m2  LMP 07/02/2014 Height 5\' 3"  (1.6 m), weight 128 lb (58.06 kg), last menstrual period 07/02/2014.  O2 saturation 100%  Physical Exam  Constitutional: She appears well-developed and well-nourished.  HENT:  Head: Normocephalic.  Eyes: Pupils are equal, round, and reactive to light.  Cardiovascular:  Normal rate, regular rhythm, normal heart sounds and intact distal pulses.  Exam reveals no gallop and no friction rub.   No murmur  heard. Respiratory: Effort normal and breath sounds normal. No respiratory distress. She has no wheezes. She has no rales. She exhibits no tenderness.  GI: Soft. Bowel sounds are normal. She exhibits no distension and no mass. There is no tenderness. There is no rebound and no guarding.  Pelvic exam: Cervix pink, visually closed, without lesion, small amount white thin discharge, vaginal walls and external genitalia normal Bimanual exam: Cervix 0/long/high, firm, anterior, neg CMT, uterus nontender, nonenlarged, adnexa without tenderness, enlargement, or mass  MAU Course  Procedures  Labs 1. CBC  2. Ultrasound  3. Beta HCG 4. ABO Blood Typing  5. Wet Prep  6. GC/CL   MDM Results for orders placed during the hospital encounter of 07/30/14 (from the past 24 hour(s))  URINALYSIS, ROUTINE W REFLEX MICROSCOPIC     Status: None   Collection Time    07/30/14  9:05 AM      Result Value Ref Range   Color, Urine YELLOW  YELLOW   APPearance CLEAR  CLEAR   Specific Gravity, Urine 1.010  1.005 - 1.030   pH 6.5  5.0 - 8.0   Glucose, UA NEGATIVE  NEGATIVE mg/dL   Hgb urine dipstick NEGATIVE  NEGATIVE   Bilirubin Urine NEGATIVE  NEGATIVE   Ketones, ur NEGATIVE  NEGATIVE mg/dL   Protein, ur NEGATIVE  NEGATIVE mg/dL   Urobilinogen, UA 0.2  0.0 - 1.0 mg/dL   Nitrite NEGATIVE  NEGATIVE   Leukocytes, UA NEGATIVE  NEGATIVE  POCT PREGNANCY, URINE     Status: Abnormal   Collection Time    07/30/14  9:15 AM      Result Value Ref Range   Preg Test, Ur POSITIVE (*) NEGATIVE  WET PREP, GENITAL     Status: Abnormal   Collection Time    07/30/14 10:39 AM      Result Value Ref Range   Yeast Wet Prep HPF POC NONE SEEN  NONE SEEN   Trich, Wet Prep NONE SEEN  NONE SEEN   Clue Cells Wet Prep HPF POC MODERATE (*) NONE SEEN   WBC, Wet Prep HPF POC FEW (*) NONE SEEN  CBC     Status: Abnormal   Collection Time    07/30/14 10:49 AM      Result Value Ref Range   WBC 4.6  4.0 - 10.5 K/uL   RBC 3.99  3.87  - 5.11 MIL/uL   Hemoglobin 12.4  12.0 - 15.0 g/dL   HCT 16.135.3 (*) 09.636.0 - 04.546.0 %   MCV 88.5  78.0 - 100.0 fL   MCH 31.1  26.0 - 34.0 pg   MCHC 35.1  30.0 - 36.0 g/dL   RDW 40.912.2  81.111.5 - 91.415.5 %   Platelets 317  150 - 400 K/uL  HCG, QUANTITATIVE, PREGNANCY     Status: Abnormal   Collection Time    07/30/14 10:49 AM      Result Value Ref Range   hCG, Beta Chain, Quant, S 170 (*) <5 mIU/mL   [place] Ob Comp Less 14 Wks  07/30/2014   CLINICAL DATA:  Abdominal pain and cramping for 2 days. Early pregnancy. LMP 07/02/2014. Beta HCG not available.  EXAM: OBSTETRIC <14 WK US AND TRANSVAGINAL OB US  TECHNIQUE: Both transabdominal and transvaginal ultrasound examinations were performed for complete evaluation of the gestation as well as  the maternal uterus, adnexal regions, and pelvic cul-de-sac. Transvaginal technique was performed to assess early pregnancy.  COMPARISON:  Pelvic ultrasound 05/25/2014  FINDINGS: Intrauterine gestational sac: Not identified.  Maternal uterus/adnexae: Uterus is unremarkable in appearance, with endometrium approximately 11 mm in thickness. The ovaries are normal in appearance. Small amount of free fluid is present in the pelvis. No adnexal mass is seen.  IMPRESSION: 1. Normal appearance of the uterus and ovaries. A gestational sac is not identified, which may reflect the early stage of pregnancy. Recommend correlation with beta HCG when available and close follow-up. 2. Small amount of free fluid in the pelvis.   Electronically Signed   By: Sebastian Ache   On: 07/30/2014 11:48   US Ob Transvaginal  07/30/2014   CLINICAL DATA:  Abdominal pain and cramping for 2 days. Early pregnancy. LMP 07/02/2014. Beta HCG not available.  EXAM: OBSTETRIC <14 WK [place] AND TRANSVAGINAL OB US  TECHNIQUE: Both transabdominal and transvaginal ultrasound examinations were performed for complete evaluation of the gestation as well as the maternal uterus, adnexal regions, and pelvic cul-de-sac. Transvaginal  technique was performed to assess early pregnancy.  COMPARISON:  Pelvic ultrasound 05/25/2014  FINDINGS: Intrauterine gestational sac: Not identified.  Maternal uterus/adnexae: Uterus is unremarkable in appearance, with endometrium approximately 11 mm in thickness. The ovaries are normal in appearance. Small amount of free fluid is present in the pelvis. No adnexal mass is seen.  IMPRESSION: 1. Normal appearance of the uterus and ovaries. A gestational sac is not identified, which may reflect the early stage of pregnancy. Recommend correlation with beta HCG when available and close follow-up. 2. Small amount of free fluid in the pelvis.   Electronically Signed   By: Sebastian Ache   On: 07/30/2014 11:48     Assessment and Plan  1. Pregnancy  2. Will come to the MAU in 48 hours for repeat quant hcg  Stacey Reid 07/30/2014, 9:48 AM   I have seen this patient and agree with the above PA student's note.  Ectopic precautions given.  LEFTWICH-KIRBY, Kadin Bera Certified Nurse-Midwife

## 2014-07-30 NOTE — MAU Note (Signed)
Patient states she had had 2 positive home pregnancy tests. Has been having cramping for 2 days with a vaginal discharge. Denies bleeding.

## 2014-07-31 LAB — GC/CHLAMYDIA PROBE AMP
CT PROBE, AMP APTIMA: NEGATIVE
GC Probe RNA: NEGATIVE

## 2014-08-01 ENCOUNTER — Inpatient Hospital Stay (HOSPITAL_COMMUNITY)
Admission: AD | Admit: 2014-08-01 | Discharge: 2014-08-01 | Disposition: A | Discharge status: Home / Self Care | Payer: Medicaid Other | Source: Ambulatory Visit | Attending: Obstetrics & Gynecology | Admitting: Obstetrics & Gynecology

## 2014-08-01 DIAGNOSIS — O99891 Other specified diseases and conditions complicating pregnancy: Secondary | ICD-10-CM | POA: Diagnosis not present

## 2014-08-01 DIAGNOSIS — O9989 Other specified diseases and conditions complicating pregnancy, childbirth and the puerperium: Principal | ICD-10-CM

## 2014-08-01 DIAGNOSIS — J45909 Unspecified asthma, uncomplicated: Secondary | ICD-10-CM | POA: Diagnosis not present

## 2014-08-01 DIAGNOSIS — O3680X0 Pregnancy with inconclusive fetal viability, not applicable or unspecified: Secondary | ICD-10-CM

## 2014-08-01 LAB — HCG, QUANTITATIVE, PREGNANCY: hCG, Beta Chain, Quant, S: 348 m[IU]/mL — ABNORMAL HIGH (ref <5)

## 2014-08-01 NOTE — Discharge Instructions (Signed)
Ectopic Pregnancy °An ectopic pregnancy happens when a fertilized egg grows outside the uterus. A pregnancy cannot live outside of the uterus. This problem often happens in the fallopian tube. It is often caused by damage to the fallopian tube. °If this problem is found early, you may be treated with medicine. If your tube tears or bursts open (ruptures), you will bleed inside. This is an emergency. You will need surgery. Get help right away.  °SYMPTOMS °You may have normal pregnancy symptoms at first. These include: °· Missing your period. °· Feeling sick to your stomach (nauseous). °· Being tired. °· Having tender breasts. °Then, you may start to have symptoms that are not normal. These include: °· Pain with sex (intercourse). °· Bleeding from the vagina. This includes light bleeding (spotting). °· Belly (abdomen) or lower belly cramping or pain. This may be felt on one side. °· A fast heartbeat (pulse). °· Passing out (fainting) after going poop (bowel movement). °If your tube tears, you may have symptoms such as: °· Really bad pain in the belly or lower belly. This happens suddenly. °· Dizziness. °· Passing out. °· Shoulder pain. °GET HELP RIGHT AWAY IF:  °You have any of these symptoms. This is an emergency. °MAKE SURE YOU: °· Understand these instructions. °· Will watch your condition. °· Will get help right away if you are not doing well or get worse. °Document Released: 03/08/2009 Document Revised: 12/15/2013 Document Reviewed: 07/22/2013 °ExitCare® Patient Information ©2015 [employer]. This information is not intended to replace advice given to you by your health care provider. Make sure you discuss any questions you have with your health care provider. ° °

## 2014-08-01 NOTE — MAU Provider Note (Signed)
  History     CSN: [id number]  Arrival date and time: 08/01/14 1113   None     Chief Complaint  Patient presents with  . Follow-Up BHCG    HPI Comments: Stacey Reid 18 y.o. G1P0000 10529w2d presents to MAU for follow up BHCG. She was 170 two days ago with nothing in uterus. She denies any bleeding and has a little cramping that is not painful. [facility]      Past Medical History  Diagnosis Date  . Asthma     No past surgical history on file.  No family history on file.  History  Substance Use Topics  . Smoking status: Never Smoker   . Smokeless tobacco: Not on file  . Alcohol Use: No    Allergies: No Known Allergies  No prescriptions prior to admission    Review of Systems  Constitutional: Negative.   HENT: Negative.   Eyes: Negative.   Respiratory: Negative.   Cardiovascular: Negative.   Gastrointestinal: Negative.   Genitourinary:       Not painful cramps  Musculoskeletal: Negative.   Skin: Negative.   Neurological: Negative.   Psychiatric/Behavioral: Negative.    Physical Exam   Blood pressure 120/56, pulse 75, temperature 98.2 F (36.8 C), temperature source Oral, resp. rate 16, height 5\' 2"  (1.575 m), weight 56.7 kg (125 lb), last menstrual period 07/02/2014.  Physical Exam  Constitutional: She is oriented to person, place, and time. She appears well-developed and well-nourished. No distress.  HENT:  Head: Normocephalic and atraumatic.  Eyes: Pupils are equal, round, and reactive to light.  Musculoskeletal: Normal range of motion.  Neurological: She is alert and oriented to person, place, and time.  Skin: Skin is warm and dry.  Psychiatric: She has a normal mood and affect. Her behavior is normal. Judgment and thought content normal.   Lab Results  Component Value Date   HCGBETAQNT 348* 08/01/2014   HCGBETAQNT 170* 07/30/2014      MAU Course  Procedures  MDM   Assessment and Plan   A: Pregnancy  of unknown location  P: Will repeat ultrasound in 2 weeks. Monday August 16, 2014 Advised for any pain / bleeding to return to MAU Start PNV [facility]  Sebastian Lurz, Rubbie BattiestLinda Miller 08/01/2014, 1:46 PM

## 2014-08-01 NOTE — MAU Note (Signed)
Pt here for repeat BHCG only, denies pain or bleeding 

## 2014-08-02 NOTE — MAU Provider Note (Signed)
Attestation of Attending Supervision of Advanced Practitioner (CNM/NP): Evaluation and management procedures were performed by the Advanced Practitioner under my supervision and collaboration. I have reviewed the Advanced Practitioner's note and chart, and I agree with the management and plan.  Samanyu Tinnell H. 6:33 AM

## 2014-08-16 ENCOUNTER — Ambulatory Visit (HOSPITAL_COMMUNITY)
Admission: RE | Admit: 2014-08-16 | Discharge: 2014-08-16 | Disposition: A | Discharge status: Home / Self Care | Payer: Medicaid Other | Source: Ambulatory Visit | Attending: Obstetrics and Gynecology | Admitting: Obstetrics and Gynecology

## 2014-08-16 DIAGNOSIS — O208 Other hemorrhage in early pregnancy: Secondary | ICD-10-CM | POA: Diagnosis not present

## 2014-08-16 DIAGNOSIS — Z3689 Encounter for other specified antenatal screening: Secondary | ICD-10-CM | POA: Diagnosis present

## 2014-08-16 DIAGNOSIS — O3680X0 Pregnancy with inconclusive fetal viability, not applicable or unspecified: Secondary | ICD-10-CM

## 2014-08-25 ENCOUNTER — Encounter (HOSPITAL_COMMUNITY): Payer: Self-pay | Admitting: *Deleted

## 2014-08-25 ENCOUNTER — Inpatient Hospital Stay (HOSPITAL_COMMUNITY)
Admission: AD | Admit: 2014-08-25 | Discharge: 2014-08-25 | Disposition: A | Discharge status: Home / Self Care | Payer: Medicaid Other | Source: Ambulatory Visit | Attending: Obstetrics & Gynecology | Admitting: Obstetrics & Gynecology

## 2014-08-25 DIAGNOSIS — O210 Mild hyperemesis gravidarum: Secondary | ICD-10-CM | POA: Insufficient documentation

## 2014-08-25 DIAGNOSIS — O219 Vomiting of pregnancy, unspecified: Secondary | ICD-10-CM

## 2014-08-25 LAB — URINE MICROSCOPIC-ADD ON

## 2014-08-25 LAB — URINALYSIS, ROUTINE W REFLEX MICROSCOPIC
BILIRUBIN URINE: NEGATIVE
Glucose, UA: NEGATIVE mg/dL
Hgb urine dipstick: NEGATIVE
Nitrite: NEGATIVE
PH: 5.5 (ref 5.0–8.0)
Protein, ur: NEGATIVE mg/dL
Specific Gravity, Urine: 1.025 (ref 1.005–1.030)
Urobilinogen, UA: 0.2 mg/dL (ref 0.0–1.0)

## 2014-08-25 MED ORDER — PROMETHAZINE HCL 6.25 MG/5ML PO SYRP: 12.5000 mg | ORAL_SOLUTION | Freq: Four times a day (QID) | ORAL | Status: DC | PRN

## 2014-08-25 MED ORDER — PROMETHAZINE HCL 25 MG RE SUPP: 25.0000 mg | Freq: Four times a day (QID) | RECTAL | Status: DC | PRN

## 2014-08-25 MED ORDER — PROMETHAZINE HCL 25 MG/ML IJ SOLN
25.0000 mg | Freq: Once | INTRAVENOUS | Status: AC
  Administered 2014-08-25: 25 mg via INTRAVENOUS
  Filled 2014-08-25: qty 1

## 2014-08-25 NOTE — MAU Provider Note (Signed)
History     CSN: [id number]  Arrival date and time: 08/25/14 7829   First Provider Initiated Contact with Patient 08/25/14 [phone]      Chief Complaint  Patient presents with  . Morning Sickness   HPI Ms. Stacey Reid is a 18 y.o. G1P0000 at [redacted]w[redacted]d who presents to MAU today with complaint of N/V 2-3 times daily x 3 days. She also complains of heartburn occasionally. She denies abdominal pain, vaginal bleeding, abnormal discharge, fever, diarrhea, headache or UTI symptoms. The patient has not tried any anti-emetics yet. She plans to go to [facility] for prenatal care and is waiting for Medicaid.   OB History   Grav Para Term Preterm Abortions TAB SAB Ect Mult Living        Past Medical History  Diagnosis Date  . Asthma     History reviewed. No pertinent past surgical history.  History reviewed. No pertinent family history.  History  Substance Use Topics  . Smoking status: Never Smoker   . Smokeless tobacco: Not on file  . Alcohol Use: No    Allergies: No Known Allergies  Prescriptions prior to admission  Medication Sig Dispense Refill  . metroNIDAZOLE (METROGEL) 0.75 % vaginal gel Place 1 Applicatorful vaginally at bedtime. Apply one applicatorful to vagina at bedtime for 5 days  70 g  1    Review of Systems  Constitutional: Negative for fever and malaise/fatigue.  Gastrointestinal: Positive for nausea and vomiting. Negative for abdominal pain and constipation.  Genitourinary: Negative for dysuria, urgency and frequency.       Neg - vaginal bleeding, abnormal discharge  Neurological: Negative for headaches.   Physical Exam   Blood pressure 128/68, pulse 67, temperature 98.4 F (36.9 C), temperature source Oral, resp. rate 18, last menstrual period 07/02/2014.  Physical Exam  Constitutional: She is oriented to person, place, and time. She appears well-developed and well-nourished. No distress.  HENT:  Head: Normocephalic.  Cardiovascular:  Normal rate.   Respiratory: Effort normal.  GI: Soft. Bowel sounds are normal. She exhibits no distension and no mass. There is no tenderness. There is no rebound and no guarding.  Neurological: She is alert and oriented to person, place, and time.  Skin: Skin is warm and dry. No erythema.  Psychiatric: She has a normal mood and affect.   Results for orders placed during the hospital encounter of 08/25/14 (from the past 24 hour(s))  URINALYSIS, ROUTINE W REFLEX MICROSCOPIC     Status: Abnormal   Collection Time    08/25/14  3:20 AM      Result Value Ref Range   Color, Urine YELLOW  YELLOW   APPearance CLEAR  CLEAR   Specific Gravity, Urine 1.025  1.005 - 1.030   pH 5.5  5.0 - 8.0   Glucose, UA NEGATIVE  NEGATIVE mg/dL   Hgb urine dipstick NEGATIVE  NEGATIVE   Bilirubin Urine NEGATIVE  NEGATIVE   Ketones, ur >80 (*) NEGATIVE mg/dL   Protein, ur NEGATIVE  NEGATIVE mg/dL   Urobilinogen, UA 0.2  0.0 - 1.0 mg/dL   Nitrite NEGATIVE  NEGATIVE   Leukocytes, UA MODERATE (*) NEGATIVE  URINE MICROSCOPIC-ADD ON     Status: Abnormal   Collection Time    08/25/14  3:20 AM      Result Value Ref Range   Squamous Epithelial / LPF FEW (*) RARE   WBC, UA 3-6  <3 WBC/hpf   RBC /  HPF 0-2  <3 RBC/hpf   Bacteria, UA RARE  RARE    MAU Course  Procedures None  MDM UA shows signs for dehydration 1 liter D5LR with 25 mg Phenergan infusion ordered Patient states that she is unable to swallow pills Patient reports improvement in nausea. No vomiting while in MAU.   Assessment and Plan  A: SIUP at [redacted]w[redacted]d Nausea and vomiting in pregnancy prior to [redacted] weeks gestation  P: Discharge home Rx for Phenergan syrup and suppositories given to patient Patient given information on hyperemesis diet Patient advised to start prenatal care with [facility] as planned ASAP Patient may return to MAU as needed or if her condition were to change or worsen   Marny Lowenstein, PA-C  08/25/2014, 5:30 AM

## 2014-08-25 NOTE — Discharge Instructions (Signed)
Morning Sickness °Morning sickness is when you feel sick to your stomach (nauseous) during pregnancy. This nauseous feeling may or may not come with vomiting. It often occurs in the morning but can be a problem any time of day. Morning sickness is most common during the first trimester, but it may continue throughout pregnancy. While morning sickness is unpleasant, it is usually harmless unless you develop severe and continual vomiting (hyperemesis gravidarum). This condition requires more intense treatment.  °CAUSES  °The cause of morning sickness is not completely known but seems to be related to normal hormonal changes that occur in pregnancy. °RISK FACTORS °You are at greater risk if you: °· Experienced nausea or vomiting before your pregnancy. °· Had morning sickness during a previous pregnancy. °· Are pregnant with more than one baby, such as twins. °TREATMENT  °Do not use any medicines (prescription, over-the-counter, or herbal) for morning sickness without first talking to your health care provider. Your health care provider may prescribe or recommend: °· Vitamin B6 supplements. °· Anti-nausea medicines. °· The herbal medicine ginger. °HOME CARE INSTRUCTIONS  °· Only take over-the-counter or prescription medicines as directed by your health care provider. °· Taking multivitamins before getting pregnant can prevent or decrease the severity of morning sickness in most women. °· Eat a piece of dry toast or unsalted crackers before getting out of bed in the morning. °· Eat five or six small meals a day. °· Eat dry and bland foods (rice, baked potato). Foods high in carbohydrates are often helpful. °· Do not drink liquids with your meals. Drink liquids between meals. °· Avoid greasy, fatty, and spicy foods. °· Get someone to cook for you if the smell of any food causes nausea and vomiting. °· If you feel nauseous after taking prenatal vitamins, take the vitamins at night or with a snack.  °· Snack on protein  foods (nuts, yogurt, cheese) between meals if you are hungry. °· Eat unsweetened gelatins for desserts. °· Wearing an acupressure wristband (worn for sea sickness) may be helpful. °· Acupuncture may be helpful. °· Do not smoke. °· Get a humidifier to keep the air in your house free of odors. °· Get plenty of fresh air. °SEEK MEDICAL CARE IF:  °· Your home remedies are not working, and you need medicine. °· You feel dizzy or lightheaded. °· You are losing weight. °SEEK IMMEDIATE MEDICAL CARE IF:  °· You have persistent and uncontrolled nausea and vomiting. °· You pass out (faint). °MAKE SURE YOU: °· Understand these instructions. °· Will watch your condition. °· Will get help right away if you are not doing well or get worse. °Document Released: 01/31/2007 Document Revised: 12/15/2013 Document Reviewed: 05/27/2013 °Exit[employer]® Patient Information ©2015 [employer]. This information is not intended to replace advice given to you by your health care provider. Make sure you discuss any questions you have with your health care provider. ° ° °Eating Plan for Hyperemesis Gravidarum °Severe cases of hyperemesis gravidarum can lead to dehydration and malnutrition. The hyperemesis eating plan is one way to lessen the symptoms of nausea and vomiting. It is often used with prescribed medicines to control your symptoms.  °WHAT CAN I DO TO RELIEVE MY SYMPTOMS? °Listen to your body. Everyone is different and has different preferences. Find what works best for you. Some of the following things may help: °· Eat and drink slowly. °· Eat 5-6 small meals daily instead of 3 large meals.   °· Eat crackers before you get out of bed in the   morning.   °· Starchy foods are usually well tolerated (such as cereal, toast, bread, potatoes, pasta, rice, and pretzels).   °· Ginger may help with nausea. Add ¼ tsp ground ginger to hot tea or choose ginger tea.   °· Try drinking 100% fruit juice or an electrolyte drink. °· Continue to take your  prenatal vitamins as directed by your health care provider. If you are having trouble taking your prenatal vitamins, talk with your health care provider about different options. °· Include at least 1 serving of protein with your meals and snacks (such as meats or poultry, beans, nuts, eggs, or yogurt). Try eating a protein-rich snack before bed (such as cheese and crackers or a half turkey or peanut butter sandwich). °WHAT THINGS SHOULD I AVOID TO REDUCE MY SYMPTOMS? °The following things may help reduce your symptoms: °· Avoid foods with strong smells. Try eating meals in well-ventilated areas that are free of odors. °· Avoid drinking water or other beverages with meals. Try not to drink anything less than 30 minutes before and after meals. °· Avoid drinking more than 1 cup of fluid at a time. °· Avoid fried or high-fat foods, such as butter and cream sauces. °· Avoid spicy foods. °· Avoid skipping meals the best you can. Nausea can be more intense on an empty stomach. If you cannot tolerate food at that time, do not force it. Try sucking on ice chips or other frozen items and make up the calories later. °· Avoid lying down within 2 hours after eating. °Document Released: 10/07/2007 Document Revised: 12/15/2013 Document Reviewed: 10/14/2013 °Exit[employer]® Patient Information ©2015 [employer]. This information is not intended to replace advice given to you by your health care provider. Make sure you discuss any questions you have with your health care provider. ° ° °

## 2014-08-25 NOTE — MAU Note (Signed)
Pt states that she began vomiting 2 days ago and vomits about 3 times in a 24 hour period. Pt states that during this time she began to have discomfort under the left breast.

## 2014-08-25 NOTE — MAU Provider Note (Signed)
Attestation of Attending Supervision of Advanced Practitioner (CNM/NP): Evaluation and management procedures were performed by the Advanced Practitioner under my supervision and collaboration.  I have reviewed the Advanced Practitioner's note and chart, and I agree with the management and plan.  HARRAWAY-SMITH, Ashiyah Pavlak 7:30 AM

## 2014-09-10 ENCOUNTER — Encounter (HOSPITAL_COMMUNITY): Payer: Self-pay | Admitting: *Deleted

## 2014-09-10 ENCOUNTER — Inpatient Hospital Stay (HOSPITAL_COMMUNITY)
Admission: AD | Admit: 2014-09-10 | Discharge: 2014-09-10 | Disposition: A | Discharge status: Home / Self Care | Payer: Medicaid Other | Source: Ambulatory Visit | Attending: Obstetrics and Gynecology | Admitting: Obstetrics and Gynecology

## 2014-09-10 DIAGNOSIS — O219 Vomiting of pregnancy, unspecified: Secondary | ICD-10-CM

## 2014-09-10 DIAGNOSIS — N390 Urinary tract infection, site not specified: Secondary | ICD-10-CM

## 2014-09-10 DIAGNOSIS — O2390 Unspecified genitourinary tract infection in pregnancy, unspecified trimester: Secondary | ICD-10-CM | POA: Insufficient documentation

## 2014-09-10 DIAGNOSIS — O210 Mild hyperemesis gravidarum: Secondary | ICD-10-CM | POA: Insufficient documentation

## 2014-09-10 DIAGNOSIS — O9989 Other specified diseases and conditions complicating pregnancy, childbirth and the puerperium: Secondary | ICD-10-CM | POA: Diagnosis not present

## 2014-09-10 DIAGNOSIS — K117 Disturbances of salivary secretion: Secondary | ICD-10-CM | POA: Diagnosis not present

## 2014-09-10 LAB — URINE MICROSCOPIC-ADD ON

## 2014-09-10 LAB — URINALYSIS, ROUTINE W REFLEX MICROSCOPIC
BILIRUBIN URINE: NEGATIVE
Glucose, UA: NEGATIVE mg/dL
KETONES UR: 15 mg/dL — AB
NITRITE: POSITIVE — AB
PH: 6 (ref 5.0–8.0)
Protein, ur: NEGATIVE mg/dL
Specific Gravity, Urine: 1.025 (ref 1.005–1.030)
UROBILINOGEN UA: 0.2 mg/dL (ref 0.0–1.0)

## 2014-09-10 MED ORDER — DEXTROSE 5 % IV SOLN
1.0000 g | Freq: Once | INTRAVENOUS | Status: AC
  Administered 2014-09-10: 1 g via INTRAVENOUS
  Filled 2014-09-10: qty 10

## 2014-09-10 MED ORDER — ONDANSETRON 4 MG PO TBDP: 4.0000 mg | ORAL_TABLET | Freq: Three times a day (TID) | ORAL | Status: DC | PRN

## 2014-09-10 MED ORDER — GLYCOPYRROLATE 1 MG PO TABS
1.0000 mg | ORAL_TABLET | Freq: Once | ORAL | Status: AC
  Administered 2014-09-10: 1 mg via ORAL
  Filled 2014-09-10: qty 1

## 2014-09-10 MED ORDER — PROMETHAZINE HCL 25 MG/ML IJ SOLN
25.0000 mg | Freq: Once | INTRAMUSCULAR | Status: AC
  Administered 2014-09-10: 25 mg via INTRAVENOUS
  Filled 2014-09-10: qty 1

## 2014-09-10 MED ORDER — SODIUM CHLORIDE 0.9 % IV BOLUS (SEPSIS)
1000.0000 mL | Freq: Once | INTRAVENOUS | Status: AC
  Administered 2014-09-10: 1000 mL via INTRAVENOUS

## 2014-09-10 NOTE — MAU Provider Note (Signed)
History     CSN: [id number]  Arrival date and time: 09/10/14 1308   First Provider Initiated Contact with Patient 09/10/14 2057      Chief Complaint  Patient presents with  . Emesis During Pregnancy  . Abdominal Pain  . Chest Pain   HPI  Stacey Reid is a 18 y.o. G1P0000 at [redacted]w[redacted]d who presents today with nausea and vomiting. She has rx phenergan, but she has not been taking it. She also reports frequent spitting, and is interested in something to help with that. She denies any pain or bleeding. She has had some dysuria.   Past Medical History  Diagnosis Date  . Asthma     History reviewed. No pertinent past surgical history.  History reviewed. No pertinent family history.  History  Substance Use Topics  . Smoking status: Never Smoker   . Smokeless tobacco: Not on file  . Alcohol Use: No    Allergies: No Known Allergies  Prescriptions prior to admission  Medication Sig Dispense Refill  . Prenatal Vit-Fe Fumarate-FA (PRENATAL MULTIVITAMIN) TABS tablet Take 1 tablet by mouth daily at 12 noon.      . promethazine (PHENERGAN) 6.25 MG/5ML syrup Take 10 mLs (12.5 mg total) by mouth every 6 (six) hours as needed for nausea or vomiting.  120 mL  0   Results for orders placed during the hospital encounter of 09/10/14 (from the past 48 hour(s))  URINALYSIS, ROUTINE W REFLEX MICROSCOPIC     Status: Abnormal   Collection Time    09/10/14  7:00 PM      Result Value Ref Range   Color, Urine YELLOW  YELLOW   APPearance HAZY (*) CLEAR   Specific Gravity, Urine 1.025  1.005 - 1.030   pH 6.0  5.0 - 8.0   Glucose, UA NEGATIVE  NEGATIVE mg/dL   Hgb urine dipstick TRACE (*) NEGATIVE   Bilirubin Urine NEGATIVE  NEGATIVE   Ketones, ur 15 (*) NEGATIVE mg/dL   Protein, ur NEGATIVE  NEGATIVE mg/dL   Urobilinogen, UA 0.2  0.0 - 1.0 mg/dL   Nitrite POSITIVE (*) NEGATIVE   Leukocytes, UA MODERATE (*) NEGATIVE  URINE MICROSCOPIC-ADD ON     Status: Abnormal   Collection Time   09/10/14  7:00 PM      Result Value Ref Range   Squamous Epithelial / LPF MANY (*) RARE   WBC, UA 21-50  <3 WBC/hpf   RBC / HPF 3-6  <3 RBC/hpf   Bacteria, UA MANY (*) RARE   Crystals CA OXALATE CRYSTALS (*) NEGATIVE   Urine-Other MUCOUS PRESENT      Review of Systems  Constitutional: Negative for fever and chills.  Genitourinary: Positive for dysuria. Negative for flank pain.   Physical Exam   Blood pressure 104/51, pulse 76, temperature 99.1 F (37.3 C), temperature source Oral, resp. rate 18, height  (1.575 m), weight 54.432 kg (120 lb), last menstrual period 07/02/2014, SpO2 100.00%.  Physical Exam  Nursing note and vitals reviewed. Constitutional: She is oriented to person, place, and time. She appears well-developed and well-nourished. No distress.  HENT:  Head: Normocephalic.  Cardiovascular: Normal rate.   Respiratory: Effort normal.  GI: Soft. Normal appearance. There is no tenderness. There is no CVA tenderness.  Musculoskeletal: Normal range of motion.  Neurological: She is alert and oriented to person, place, and time.  Skin: Skin is warm and dry.  Psychiatric: She has a normal mood and affect.    MAU Course  Procedures Results for orders placed during the hospital encounter of 09/10/14 (from the past 24 hour(s))  URINALYSIS, ROUTINE W REFLEX MICROSCOPIC     Status: Abnormal   Collection Time    09/10/14  7:00 PM      Result Value Ref Range   Color, Urine YELLOW  YELLOW   APPearance HAZY (*) CLEAR   Specific Gravity, Urine 1.025  1.005 - 1.030   pH 6.0  5.0 - 8.0   Glucose, UA NEGATIVE  NEGATIVE mg/dL   Hgb urine dipstick TRACE (*) NEGATIVE   Bilirubin Urine NEGATIVE  NEGATIVE   Ketones, ur 15 (*) NEGATIVE mg/dL   Protein, ur NEGATIVE  NEGATIVE mg/dL   Urobilinogen, UA 0.2  0.0 - 1.0 mg/dL   Nitrite POSITIVE (*) NEGATIVE   Leukocytes, UA MODERATE (*) NEGATIVE  URINE MICROSCOPIC-ADD ON     Status: Abnormal   Collection Time    09/10/14  7:00 PM       Result Value Ref Range   Squamous Epithelial / LPF MANY (*) RARE   WBC, UA 21-50  <3 WBC/hpf   RBC / HPF 3-6  <3 RBC/hpf   Bacteria, UA MANY (*) RARE   Crystals CA OXALATE CRYSTALS (*) NEGATIVE   Urine-Other MUCOUS PRESENT    2102: Orders for NS bolus, 1 gram rocephin to treat UTI, 25 mg phenergan and 1 mg robinul 2103: Care turned over to J. Meshach Perry, NP   Tawnya Crook 9:04 PM 09/10/2014  Discussed adding Zofran for N/V. I discussed the risks involved with using zofran in the first trimester or pregnancy. Pt aware of the risks and agreed to use on a PRN basis. Pt has a difficult time swallowing pills and is unable to swallow pills majority of the time. I will not prescribe robinul for this reason; patient agrees.  Patient states she has been using the phenergan syrup, however feels that it makes her very sleepy.  Urine culture pending  Assessment and Plan   A:  1. Nausea and vomiting in pregnancy   2. UTI (lower urinary tract infection)   3. Ptyalism    P:  Discharge home in stable condition Return to MAU as needed, if symptoms worsen RX: Zofran  Small frequent meals Return with any fever, chills.   Iona Hansen Charlee Squibb, NP 09/10/2014 11:16 PM

## 2014-09-10 NOTE — MAU Note (Addendum)
Pt. Has been throwing up everyday since the beginning of her pregnancy. Has been prescribed a liquid medication for nausea but she feels that it doesn't work. Last took this medication 3 days ago. Was also prescribed suppositories and she has not used these. Pt. Reports upper abdominal pain and pain around the left breast for a week and this comes and goes. Denies pain now. Denies bleeding but notes a white/clear discharge. Is awaiting medicaid before she schedules a OB visit.

## 2014-09-10 NOTE — MAU Note (Signed)
Patient states she has had vomiting every day, multiple times per day since she was in MAU on 9-2. States she has some mid upper abdominal pain and pain in the left breast area that has been there for about one week. Denies bleeding but does have a slight vaginal discharge.

## 2014-09-12 NOTE — MAU Provider Note (Signed)
Attestation of Attending Supervision of Advanced Practitioner (CNM/NP): Evaluation and management procedures were performed by the Advanced Practitioner under my supervision and collaboration.  I have reviewed the Advanced Practitioner's note and chart, and I agree with the management and plan.  Makynli Stills 09/12/2014 9:51 AM

## 2014-09-13 LAB — URINE CULTURE
Colony Count: >=100000
Special Requests: NORMAL

## 2014-09-17 ENCOUNTER — Inpatient Hospital Stay (HOSPITAL_COMMUNITY)
Admission: AD | Admit: 2014-09-17 | Discharge: 2014-09-17 | Disposition: A | Discharge status: Home / Self Care | Payer: Medicaid Other | Source: Ambulatory Visit | Attending: Obstetrics & Gynecology | Admitting: Obstetrics & Gynecology

## 2014-09-17 ENCOUNTER — Encounter (HOSPITAL_COMMUNITY): Payer: Self-pay | Admitting: *Deleted

## 2014-09-17 DIAGNOSIS — F3289 Other specified depressive episodes: Secondary | ICD-10-CM | POA: Diagnosis not present

## 2014-09-17 DIAGNOSIS — K117 Disturbances of salivary secretion: Secondary | ICD-10-CM | POA: Diagnosis not present

## 2014-09-17 DIAGNOSIS — O210 Mild hyperemesis gravidarum: Secondary | ICD-10-CM | POA: Diagnosis present

## 2014-09-17 DIAGNOSIS — F329 Major depressive disorder, single episode, unspecified: Secondary | ICD-10-CM | POA: Insufficient documentation

## 2014-09-17 DIAGNOSIS — O209 Hemorrhage in early pregnancy, unspecified: Secondary | ICD-10-CM

## 2014-09-17 DIAGNOSIS — R109 Unspecified abdominal pain: Secondary | ICD-10-CM | POA: Diagnosis not present

## 2014-09-17 DIAGNOSIS — O9989 Other specified diseases and conditions complicating pregnancy, childbirth and the puerperium: Secondary | ICD-10-CM | POA: Diagnosis not present

## 2014-09-17 DIAGNOSIS — O99340 Other mental disorders complicating pregnancy, unspecified trimester: Secondary | ICD-10-CM | POA: Diagnosis not present

## 2014-09-17 DIAGNOSIS — O99341 Other mental disorders complicating pregnancy, first trimester: Secondary | ICD-10-CM

## 2014-09-17 DIAGNOSIS — O219 Vomiting of pregnancy, unspecified: Secondary | ICD-10-CM

## 2014-09-17 LAB — URINALYSIS, ROUTINE W REFLEX MICROSCOPIC
Bilirubin Urine: NEGATIVE
GLUCOSE, UA: NEGATIVE mg/dL
Hgb urine dipstick: NEGATIVE
KETONES UR: NEGATIVE mg/dL
LEUKOCYTES UA: NEGATIVE
Nitrite: NEGATIVE
Protein, ur: NEGATIVE mg/dL
Specific Gravity, Urine: 1.015 (ref 1.005–1.030)
Urobilinogen, UA: 1 mg/dL (ref 0.0–1.0)
pH: 6.5 (ref 5.0–8.0)

## 2014-09-17 MED ORDER — GLYCOPYRROLATE 1 MG PO TABS
2.0000 mg | ORAL_TABLET | Freq: Three times a day (TID) | ORAL | Status: DC | PRN
  Filled 2014-09-17: qty 2

## 2014-09-17 MED ORDER — GLYCOPYRROLATE 1 MG PO TABS: 1.0000 mg | ORAL_TABLET | Freq: Three times a day (TID) | ORAL | Status: DC | PRN

## 2014-09-17 MED ORDER — ONDANSETRON 8 MG PO TBDP
8.0000 mg | ORAL_TABLET | Freq: Once | ORAL | Status: AC
  Administered 2014-09-17: 8 mg via ORAL
  Filled 2014-09-17: qty 1

## 2014-09-17 MED ORDER — RANITIDINE HCL 75 MG PO TABS: 75.0000 mg | ORAL_TABLET | Freq: Two times a day (BID) | ORAL | Status: DC

## 2014-09-17 NOTE — MAU Note (Signed)
Pt states she was seen in the MAU a week ago and  given medicine for vomiting but medicine does not seem to work.  States anytime eats or drink she vomits and now has lower abdomen pain with the vomiting.  Pt states that she aches all over.  Also states that she feels depressed and has considered harming herself.

## 2014-09-17 NOTE — MAU Provider Note (Signed)
History     CSN: [id number]  Arrival date and time: 09/17/14 4098   First Provider Initiated Contact with Patient 09/17/14 1008      Chief Complaint  Patient presents with  . Emesis   HPI Stacey Reid is a 18 y.o. G1P0000 at [redacted]w[redacted]d into her first pregnancy. [facility] complaining of N/V, abdominal cramping and vaginal bleeding. She states the nausea and vomiting began a month ago and has gotten worse recently she now vomits 3 times a day on average unable to keep fluids or food down. She was seen at MAU on the 18th of this month for emesis and given zofran, which she states "helped for a little but it stopped working", eating makes it worse. The abdominal cramping was described as episodic cramping pain in her lower abdomen L>R. It has been going on for the last couple of days and resting makes the cramping better. She states that the vaginal bleeding was noticed it while wiping this morning and she hasn't noticed any bleeding before today. She describes the blood as bright red and scant. The patient also spoke to the nurse and the provider about having thought about harming herself 3 days ago, she says that she no longer feels this way. She says the reason for these feeling is the fact that she feels bad and can't stop vomiting. ROS- See below   OB History   Grav Para Term Preterm Abortions TAB SAB Ect Mult Living        Past Medical History  Diagnosis Date  . Asthma     History reviewed. No pertinent past surgical history.  History reviewed. No pertinent family history.  History  Substance Use Topics  . Smoking status: Never Smoker   . Smokeless tobacco: Not on file  . Alcohol Use: No    Allergies: No Known Allergies  Prescriptions prior to admission  Medication Sig Dispense Refill  . ondansetron (ZOFRAN ODT) 4 MG disintegrating tablet Take 1 tablet (4 mg total) by mouth every 8 (eight) hours as needed for nausea or  vomiting.  20 tablet  0  . Prenatal Vit-Fe Fumarate-FA (PRENATAL MULTIVITAMIN) TABS tablet Take 1 tablet by mouth daily at 12 noon.      . ranitidine (ZANTAC) 150 MG tablet Take 150 mg by mouth daily as needed for heartburn.       Results for orders placed during the hospital encounter of 09/17/14 (from the past 24 hour(s))  URINALYSIS, ROUTINE W REFLEX MICROSCOPIC     Status: None   Collection Time    09/17/14  9:40 AM      Result Value Ref Range   Color, Urine YELLOW  YELLOW   APPearance CLEAR  CLEAR   Specific Gravity, Urine 1.015  1.005 - 1.030   pH 6.5  5.0 - 8.0   Glucose, UA NEGATIVE  NEGATIVE mg/dL   Hgb urine dipstick NEGATIVE  NEGATIVE   Bilirubin Urine NEGATIVE  NEGATIVE   Ketones, ur NEGATIVE  NEGATIVE mg/dL   Protein, ur NEGATIVE  NEGATIVE mg/dL   Urobilinogen, UA 1.0  0.0 - 1.0 mg/dL   Nitrite NEGATIVE  NEGATIVE   Leukocytes, UA NEGATIVE  NEGATIVE    Review of Systems  Constitutional: Positive for chills, weight loss (10 pounds in a month) and malaise/fatigue. Negative for fever and diaphoresis.  HENT: Positive for congestion. Negative for sore throat.   Eyes: Negative for blurred vision.  Respiratory: Positive for shortness of breath. Negative for cough and wheezing.   Cardiovascular: Negative for chest pain.  Gastrointestinal: Positive for heartburn, nausea, vomiting, abdominal pain and constipation (no bm in a week). Negative for diarrhea.  Genitourinary: Positive for frequency. Negative for dysuria and urgency.  Musculoskeletal: Positive for myalgias.  Skin: Negative for rash.  Neurological: Positive for headaches. Negative for dizziness.  Psychiatric/Behavioral: Positive for depression and suicidal ideas (3 days ago). Negative for substance abuse. The patient is not nervous/anxious.    Physical Exam   Blood pressure 120/65, pulse 106, temperature 98.1 F (36.7 C), temperature source Oral, resp. rate 16, height  (1.575 m), weight 53.524 kg (118 lb), last  menstrual period 07/02/2014.  Physical Exam  Constitutional: She is oriented to person, place, and time. She appears well-developed and well-nourished. No distress.  HENT:  Head: Normocephalic.  Eyes: EOM are normal.  Neck: Normal range of motion.  Cardiovascular: Normal rate, regular rhythm and normal heart sounds.   Respiratory: Effort normal and breath sounds normal.  GI: Soft. Bowel sounds are normal.  Neurological: She is alert and oriented to person, place, and time.  Skin: Skin is warm and dry.    MAU Course  Procedures  MDM Patient improved during her stay at MAU. Bedside ultrasound performed which showed IUP with positive cardiac activity  Assessment and Plan   DC to home  Robinul 2 mg PO tablet TID PRN for ptyalism  Ondansetron  po once a day PRN for nausea  Provided with provider list to establish prenatal care.   Advised to contact [facility], provided with contact information.  If bleeding or cramping increase to return to MAU    Rosemary Holms 09/17/2014, 10:24 AM   Seen and examined by me also, agree with note Psych screen, denies suicidal or homicidal ideation now Thought about it last week, not sure why, may be "because I felt bad" Referred to [facility] and [facility] AGrees to go to [facility] if feelings return Bedside US done:  Active fetus with HR 150 [employer] Signs, CNM

## 2014-09-17 NOTE — MAU Provider Note (Signed)
Attestation of Attending Supervision of Advanced Practitioner (PA/CNM/NP): Evaluation and management procedures were performed by the Advanced Practitioner under my supervision and collaboration.  I have reviewed the Advanced Practitioner's note and chart, and I agree with the management and plan.  Elisea Khader, MD, FACOG Attending Obstetrician & Gynecologist Faculty Practice, [facility]   

## 2014-09-17 NOTE — Discharge Instructions (Signed)
Prenatal Care [facility]    [facility]  [facility]  [phone]     Phone: [phone]          [facility]                      [facility]  @[place]     Phone: [phone]  Phone: [phone]         [facility]     Phone: [phone]  Phone: [phone]           [facility] [place]                hone: [phone]  Phone: [phone]         [facility] [address] Francoise Ceo      Phone: [phone]  Phone: [phone]         [facility] Dept[facility]                Phone: [phone]  [facility]   [phone]    Family [address] [place])          Phone: [phone] [facility]   Phone: [phone] First Trimester of Pregnancy The first trimester of pregnancy is from week 1 until the end of week 12 (months 1 through 3). A week after a sperm fertilizes an egg, the egg will implant on the wall of the uterus. This embryo will begin to develop into a baby. Genes from you and your partner are forming the baby. The female genes determine whether the baby is a boy or a girl. At 6-8 weeks, the eyes and face are formed, and the heartbeat can be seen on ultrasound. At the end of 12 weeks, all the baby's organs are formed.  Now that you are pregnant, you will want to do everything you can to have a healthy baby. Two of the most important things are to get good prenatal care and to follow your health care provider's instructions. Prenatal care is all the medical care you receive before the baby's birth. This care will help prevent, find, and treat any problems during the pregnancy and childbirth. BODY CHANGES Your body goes through many changes during pregnancy. The changes vary from woman to woman.   You may gain or lose a couple of pounds at first.  You may feel sick to your stomach  (nauseous) and throw up (vomit). If the vomiting is uncontrollable, call your health care provider.  You may tire easily.  You may develop headaches that can be relieved by medicines approved by your health care provider.  You may urinate more often. Painful urination may mean you have a bladder infection.  You may develop heartburn as a result of your pregnancy.  You may develop constipation because certain hormones are causing the muscles that push waste through your intestines to slow down.  You may develop hemorrhoids or swollen, bulging veins (varicose veins).  Your breasts may begin to grow larger and become tender. Your nipples may stick out more, and the tissue that surrounds them (areola) may become darker.  Your gums may bleed and may be sensitive to brushing and flossing.  Dark spots or blotches (chloasma, mask of pregnancy) may develop on your face. This will likely fade after the baby is born.  Your menstrual periods will stop.  You  may have a loss of appetite.  You may develop cravings for certain kinds of food.  You may have changes in your emotions from day to day, such as being excited to be pregnant or being concerned that something may go wrong with the pregnancy and baby.  You may have more vivid and strange dreams.  You may have changes in your hair. These can include thickening of your hair, rapid growth, and changes in texture. Some women also have hair loss during or after pregnancy, or hair that feels dry or thin. Your hair will most likely return to normal after your baby is born. WHAT TO EXPECT AT YOUR PRENATAL VISITS During a routine prenatal visit:  You will be weighed to make sure you and the baby are growing normally.  Your blood pressure will be taken.  Your abdomen will be measured to track your baby's growth.  The fetal heartbeat will be listened to starting around week 10 or 12 of your pregnancy.  Test results from any previous visits will  be discussed. Your health care provider may ask you:  How you are feeling.  If you are feeling the baby move.  If you have had any abnormal symptoms, such as leaking fluid, bleeding, severe headaches, or abdominal cramping.  If you have any questions. Other tests that may be performed during your first trimester include:  Blood tests to find your blood type and to check for the presence of any previous infections. They will also be used to check for low iron levels (anemia) and Rh antibodies. Later in the pregnancy, blood tests for diabetes will be done along with other tests if problems develop.  Urine tests to check for infections, diabetes, or protein in the urine.  An ultrasound to confirm the proper growth and development of the baby.  An amniocentesis to check for possible genetic problems.  Fetal screens for spina bifida and Down syndrome.  You may need other tests to make sure you and the baby are doing well. HOME CARE INSTRUCTIONS  Medicines  Follow your health care provider's instructions regarding medicine use. Specific medicines may be either safe or unsafe to take during pregnancy.  Take your prenatal vitamins as directed.  If you develop constipation, try taking a stool softener if your health care provider approves. Diet  Eat regular, well-balanced meals. Choose a variety of foods, such as meat or vegetable-based protein, fish, milk and low-fat dairy products, vegetables, fruits, and whole grain breads and cereals. Your health care provider will help you determine the amount of weight gain that is right for you.  Avoid raw meat and uncooked cheese. These carry germs that can cause birth defects in the baby.  Eating four or five small meals rather than three large meals a day may help relieve nausea and vomiting. If you start to feel nauseous, eating a few soda crackers can be helpful. Drinking liquids between meals instead of during meals also seems to help nausea  and vomiting.  If you develop constipation, eat more high-fiber foods, such as fresh vegetables or fruit and whole grains. Drink enough fluids to keep your urine clear or pale yellow. Activity and Exercise  Exercise only as directed by your health care provider. Exercising will help you:  Control your weight.  Stay in shape.  Be prepared for labor and delivery.  Experiencing pain or cramping in the lower abdomen or low back is a good sign that you should stop exercising. Check with your health  care provider before continuing normal exercises.  Try to avoid standing for long periods of time. Move your legs often if you must stand in one place for a long time.  Avoid heavy lifting.  Wear low-heeled shoes, and practice good posture.  You may continue to have sex unless your health care provider directs you otherwise. Relief of Pain or Discomfort  Wear a good support bra for breast tenderness.   Take warm sitz baths to soothe any pain or discomfort caused by hemorrhoids. Use hemorrhoid cream if your health care provider approves.   Rest with your legs elevated if you have leg cramps or low back pain.  If you develop varicose veins in your legs, wear support hose. Elevate your feet for 15 minutes, 3-4 times a day. Limit salt in your diet. Prenatal Care  Schedule your prenatal visits by the twelfth week of pregnancy. They are usually scheduled monthly at first, then more often in the last 2 months before delivery.  Write down your questions. Take them to your prenatal visits.  Keep all your prenatal visits as directed by your health care provider. Safety  Wear your seat belt at all times when driving.  Make a list of emergency phone numbers, including numbers for family, friends, the hospital, and police and fire departments. General Tips  Ask your health care provider for a referral to a local prenatal education class. Begin classes no later than at the beginning of month 6  of your pregnancy.  Ask for help if you have counseling or nutritional needs during pregnancy. Your health care provider can offer advice or refer you to specialists for help with various needs.  Do not use hot tubs, steam rooms, or saunas.  Do not douche or use tampons or scented sanitary pads.  Do not cross your legs for long periods of time.  Avoid cat litter boxes and soil used by cats. These carry germs that can cause birth defects in the baby and possibly loss of the fetus by miscarriage or stillbirth.  Avoid all smoking, herbs, alcohol, and medicines not prescribed by your health care provider. Chemicals in these affect the formation and growth of the baby.  Schedule a dentist appointment. At home, brush your teeth with a soft toothbrush and be gentle when you floss. SEEK MEDICAL CARE IF:   You have dizziness.  You have mild pelvic cramps, pelvic pressure, or nagging pain in the abdominal area.  You have persistent nausea, vomiting, or diarrhea.  You have a bad smelling vaginal discharge.  You have pain with urination.  You notice increased swelling in your face, hands, legs, or ankles. SEEK IMMEDIATE MEDICAL CARE IF:   You have a fever.  You are leaking fluid from your vagina.  You have spotting or bleeding from your vagina.  You have severe abdominal cramping or pain.  You have rapid weight gain or loss.  You vomit blood or material that looks like coffee grounds.  You are exposed to [place] measles and have never had them.  You are exposed to fifth disease or chickenpox.  You develop a severe headache.  You have shortness of breath.  You have any kind of trauma, such as from a fall or a car accident. Document Released: 12/04/2001 Document Revised: 04/26/2014 Document Reviewed: 10/20/2013 [facility] Patient Information 2015 [place], [place]. This information is not intended to replace advice given to you by your health care provider. Make sure you discuss any  questions you have with your health  care provider. Depression Depression refers to feeling sad, low, down in the dumps, blue, gloomy, or empty. In general, there are two kinds of depression: 1. Normal sadness or normal grief. This kind of depression is one that we all feel from time to time after upsetting life experiences, such as the loss of a job or the ending of a relationship. This kind of depression is considered normal, is short lived, and resolves within a few days to 2 weeks. Depression experienced after the loss of a loved one (bereavement) often lasts longer than 2 weeks but normally gets better with time. 2. Clinical depression. This kind of depression lasts longer than normal sadness or normal grief or interferes with your ability to function at home, at work, and in school. It also interferes with your personal relationships. It affects almost every aspect of your life. Clinical depression is an illness. Symptoms of depression can also be caused by conditions other than those mentioned above, such as:  Physical illness. Some physical illnesses, including underactive thyroid gland (hypothyroidism), severe anemia, specific types of cancer, diabetes, uncontrolled seizures, heart and lung problems, strokes, and chronic pain are commonly associated with symptoms of depression.  Side effects of some prescription medicine. In some people, certain types of medicine can cause symptoms of depression.  Substance abuse. Abuse of alcohol and illicit drugs can cause symptoms of depression. SYMPTOMS Symptoms of normal sadness and normal grief include the following:  Feeling sad or crying for short periods of time.  Not caring about anything (apathy).  Difficulty sleeping or sleeping too much.  No longer able to enjoy the things you used to enjoy.  Desire to be by oneself all the time (social isolation).  Lack of energy or motivation.  Difficulty concentrating or remembering.  Change in  appetite or weight.  Restlessness or agitation. Symptoms of clinical depression include the same symptoms of normal sadness or normal grief and also the following symptoms:  Feeling sad or crying all the time.  Feelings of guilt or worthlessness.  Feelings of hopelessness or helplessness.  Thoughts of suicide or the desire to harm yourself (suicidal ideation).  Loss of touch with reality (psychotic symptoms). Seeing or hearing things that are not real (hallucinations) or having false beliefs about your life or the people around you (delusions and paranoia). DIAGNOSIS  The diagnosis of clinical depression is usually based on how bad the symptoms are and how long they have lasted. Your health care provider will also ask you questions about your medical history and substance use to find out if physical illness, use of prescription medicine, or substance abuse is causing your depression. Your health care provider may also order blood tests. TREATMENT  Often, normal sadness and normal grief do not require treatment. However, sometimes antidepressant medicine is given for bereavement to ease the depressive symptoms until they resolve. The treatment for clinical depression depends on how bad the symptoms are but often includes antidepressant medicine, counseling with a mental health professional, or both. Your health care provider will help to determine what treatment is best for you. Depression caused by physical illness usually goes away with appropriate medical treatment of the illness. If prescription medicine is causing depression, talk with your health care provider about stopping the medicine, decreasing the dose, or changing to another medicine. Depression caused by the abuse of alcohol or illicit drugs goes away when you stop using these substances. Some adults need professional help in order to stop drinking or using drugs. SEEK  IMMEDIATE MEDICAL CARE IF:  You have thoughts about hurting  yourself or others.  You lose touch with reality (have psychotic symptoms).  You are taking medicine for depression and have a serious side effect. FOR MORE INFORMATION  National Alliance on Mental Illness: www.nami.[employer] of Mental Health: http://www.maynard.net/ Document Released: 12/07/2000 Document Revised: 04/26/2014 Document Reviewed: 03/10/2012 [facility] Patient Information 2015 [place], [place]. This information is not intended to replace advice given to you by your health care provider. Make sure you discuss any questions you have with your health care provider.

## 2014-09-18 ENCOUNTER — Inpatient Hospital Stay (HOSPITAL_COMMUNITY)
Admission: AD | Admit: 2014-09-18 | Discharge: 2014-09-19 | Disposition: A | Discharge status: Home / Self Care | Payer: Medicaid Other | Source: Ambulatory Visit | Attending: Obstetrics & Gynecology | Admitting: Obstetrics & Gynecology

## 2014-09-18 DIAGNOSIS — O210 Mild hyperemesis gravidarum: Secondary | ICD-10-CM | POA: Diagnosis not present

## 2014-09-18 DIAGNOSIS — O219 Vomiting of pregnancy, unspecified: Secondary | ICD-10-CM

## 2014-09-18 LAB — URINALYSIS, ROUTINE W REFLEX MICROSCOPIC
BILIRUBIN URINE: NEGATIVE
Glucose, UA: NEGATIVE mg/dL
Hgb urine dipstick: NEGATIVE
KETONES UR: 15 mg/dL — AB
Leukocytes, UA: NEGATIVE
NITRITE: NEGATIVE
Protein, ur: NEGATIVE mg/dL
Urobilinogen, UA: 1 mg/dL (ref 0.0–1.0)
pH: 6 (ref 5.0–8.0)

## 2014-09-18 NOTE — MAU Note (Signed)
Unable to keep down anything. Vomited 14 times in 4 hours. Have pain in stomach and chest from throwing up so much.

## 2014-09-19 ENCOUNTER — Encounter (HOSPITAL_COMMUNITY): Payer: Self-pay | Admitting: *Deleted

## 2014-09-19 DIAGNOSIS — O219 Vomiting of pregnancy, unspecified: Secondary | ICD-10-CM

## 2014-09-19 MED ORDER — METOCLOPRAMIDE HCL 10 MG PO TABS: 10.0000 mg | ORAL_TABLET | Freq: Three times a day (TID) | ORAL | Status: DC

## 2014-09-19 MED ORDER — METOCLOPRAMIDE HCL 5 MG/ML IJ SOLN
10.0000 mg | Freq: Once | INTRAMUSCULAR | Status: AC
  Administered 2014-09-19: 10 mg via INTRAVENOUS
  Filled 2014-09-19: qty 2

## 2014-09-19 MED ORDER — LACTATED RINGERS IV BOLUS (SEPSIS)
1000.0000 mL | Freq: Once | INTRAVENOUS | Status: AC
  Administered 2014-09-19: 1000 mL via INTRAVENOUS

## 2014-09-19 NOTE — Discharge Instructions (Signed)
Morning Sickness °Morning sickness is when you feel sick to your stomach (nauseous) during pregnancy. You may feel sick to your stomach and throw up (vomit). You may feel sick in the morning, but you can feel this way any time of day. Some women feel very sick to their stomach and cannot stop throwing up (hyperemesis gravidarum). °HOME CARE °· Only take medicines as told by your doctor. °· Take multivitamins as told by your doctor. Taking multivitamins before getting pregnant can stop or lessen the harshness of morning sickness. °· Eat dry toast or unsalted crackers before getting out of bed. °· Eat 5 to 6 small meals a day. °· Eat dry and bland foods like rice and baked potatoes. °· Do not drink liquids with meals. Drink between meals. °· Do not eat greasy, fatty, or spicy foods. °· Have someone cook for you if the smell of food causes you to feel sick or throw up. °· If you feel sick to your stomach after taking prenatal vitamins, take them at night or with a snack. °· Eat protein when you need a snack (nuts, yogurt, cheese). °· Eat unsweetened gelatins for dessert. °· Wear a bracelet used for sea sickness (acupressure wristband). °· Go to a doctor that puts thin needles into certain body points (acupuncture) to improve how you feel. °· Do not smoke. °· Use a humidifier to keep the air in your house free of odors. °· Get lots of fresh air. °GET HELP IF: °· You need medicine to feel better. °· You feel dizzy or lightheaded. °· You are losing weight. °GET HELP RIGHT AWAY IF:  °· You feel very sick to your stomach and cannot stop throwing up. °· You pass out (faint). °MAKE SURE YOU: °· Understand these instructions. °· Will watch your condition. °· Will get help right away if you are not doing well or get worse. °Document Released: 01/17/2005 Document Revised: 12/15/2013 Document Reviewed: 05/27/2013 °Exit[employer]® Patient Information ©2015 [employer]. This information is not intended to replace advice given to you by  your health care provider. Make sure you discuss any questions you have with your health care provider. ° °

## 2014-09-19 NOTE — Progress Notes (Signed)
Written and verbal d/c instructions given and understanding voiced. 

## 2014-09-19 NOTE — MAU Provider Note (Signed)
History     CSN: [id number]  Arrival date and time: 09/18/14 2239   First Provider Initiated Contact with Patient 09/19/14 0056      Chief Complaint  Patient presents with  . Emesis During Pregnancy   HPI  Pt is a 18 yo G1P0000 at [redacted]w[redacted]d wks IUP here with report of increased nausea and vomiting that increased today.  History of hyperemesis and ptyalism during this pregnancy.  Taking zofran and robinul.  Pain in upper abdomen and chest from vomiting.    Past Medical History  Diagnosis Date  . Asthma     History reviewed. No pertinent past surgical history.  History reviewed. No pertinent family history.  History  Substance Use Topics  . Smoking status: Never Smoker   . Smokeless tobacco: Not on file  . Alcohol Use: No    Allergies: No Known Allergies  Prescriptions prior to admission  Medication Sig Dispense Refill  . glycopyrrolate (ROBINUL) 1 MG tablet Take 1 tablet (1 mg total) by mouth 3 (three) times daily as needed (spitting).  30 tablet  0  . ondansetron (ZOFRAN ODT) 4 MG disintegrating tablet Take 1 tablet (4 mg total) by mouth every 8 (eight) hours as needed for nausea or vomiting.  20 tablet  0  . Prenatal Vit-Fe Fumarate-FA (PRENATAL MULTIVITAMIN) TABS tablet Take 1 tablet by mouth daily at 12 noon.      . ranitidine (ZANTAC 75) 75 MG tablet Take 1 tablet (75 mg total) by mouth 2 (two) times daily.  60 tablet  1  . ranitidine (ZANTAC) 150 MG tablet Take 150 mg by mouth daily as needed for heartburn.        Review of Systems  Constitutional: Positive for chills. Negative for fever.  HENT: Negative for sore throat.   Respiratory: Negative for cough.   Gastrointestinal: Positive for heartburn, nausea, vomiting and abdominal pain.  Genitourinary: Negative.   Musculoskeletal: Positive for myalgias.  All other systems reviewed and are negative.  Physical Exam   Blood pressure 120/80, pulse 92, temperature 98.8 F (37.1 C), resp. rate 18, height  (1.575  m), weight 53.615 kg (118 lb 3.2 oz), last menstrual period 07/02/2014, SpO2 100.00%.  Physical Exam  Constitutional: She is oriented to person, place, and time. She appears well-developed and well-nourished. No distress.  HENT:  Head: Normocephalic.  Mouth/Throat: Mucous membranes are normal. Mucous membranes are not dry.  Increased spitting throughout visit  Neck: Normal range of motion. Neck supple.  Cardiovascular: Normal rate, regular rhythm and normal heart sounds.   Respiratory: Effort normal and breath sounds normal.  GI: Soft. There is no tenderness.  Hyperactive bowel sounds  Genitourinary: No bleeding around the vagina.  Neurological: She is alert and oriented to person, place, and time.  Skin: Skin is warm and dry.    MAU Course  Procedures Results for orders placed during the hospital encounter of 09/18/14 (from the past 24 hour(s))  URINALYSIS, ROUTINE W REFLEX MICROSCOPIC     Status: Abnormal   Collection Time    09/18/14 11:20 PM      Result Value Ref Range   Color, Urine YELLOW  YELLOW   APPearance CLEAR  CLEAR   Specific Gravity, Urine >1.030 (*) 1.005 - 1.030   pH 6.0  5.0 - 8.0   Glucose, UA NEGATIVE  NEGATIVE mg/dL   Hgb urine dipstick NEGATIVE  NEGATIVE   Bilirubin Urine NEGATIVE  NEGATIVE   Ketones, ur 15 (*) NEGATIVE mg/dL  Protein, ur NEGATIVE  NEGATIVE mg/dL   Urobilinogen, UA 1.0  0.0 - 1.0 mg/dL   Nitrite NEGATIVE  NEGATIVE   Leukocytes, UA NEGATIVE  NEGATIVE    I liter LR with 10 mg Reglan IV > pt reports improvement in symptoms and tolerates juice and crackers  Assessment and Plan  18 yo G1P0000 at [redacted]w[redacted]d wks IUP Vomiting in PRegnancy  Plan: Discharge to home RX Reglan Keep scheduled appointment in office  Elenora Fender [facility] N 09/19/2014, 12:56 AM

## 2014-09-19 NOTE — MAU Provider Note (Signed)
Attestation of Attending Supervision of Advanced Practitioner (CNM/NP): Evaluation and management procedures were performed by the Advanced Practitioner under my supervision and collaboration.  I have reviewed the Advanced Practitioner's note and chart, and I agree with the management and plan.  HARRAWAY-SMITH, Hollace Michelli 6:46 AM

## 2014-09-20 NOTE — MAU Provider Note (Signed)
Original note on 07/30/14.  Student had access to ED provider but not MAU provider notes.  Author: Hurshel Party, CNM Service: Obstetrics Author Type: Certified Nurse Midwife      Filed: 07/30/2014  4:45 PM Note Time: 07/30/2014  9:48 AM Note Type: ED Provider Notes      Status: Cosign Needed Editor: Hurshel Party, CNM (Certified Nurse Midwife)      Related Notes: Original Note by Marena Chancy, Student-PA (Student-PA) filed at 07/30/2014  1:45 PM      Cosign Required: Yes                 History        CSN: [id number]   Arrival date and time: 07/30/14 7829    First Provider Initiated Contact with Patient 07/30/14 0936       Stacey Reid, [id number] is a 18 year old African American female and was admitted to the Maternal Admission Unit for a positive at home pregnancy test and vaginal discharge. On 07/25/2014, the patient began starting feeling fatigued and nauseated and thought it was caused by her menstrual period. As the week progressed, she decided to purchase a pregnancy test. The at home pregnancy test was positive.   Patient rates her discomfort as a 1 out of 10 on the severity. Patients endorses a copius amount of clear vaginal discharge for the past few days. Patient has a past medical history of bacterial vaginosis. Her last menstrual period 07/02/2014. Patient endorses shortness of breath, vomiting, chills, polyruia, breast tenderness.  Patient has 1 partner, her boyfriend and usually practices protected vaginal sex. Patient denies tobacco, alcohol, and drug use. She is currently not taking any medications and/or has allergies.   Chief Complaint   Patient presents with   .  Possible Pregnancy   .  Abdominal Pain   .  Vaginal Discharge      Patient is a 18 y.o. female presenting with pregnancy problem, abdominal pain, and vaginal discharge. The history is provided by the patient. No language interpreter was used.  Possible Pregnancy This is a new problem. The current  episode started in the past 7 days. The problem occurs constantly. The problem has been unchanged. Associated symptoms include abdominal pain, anorexia, chills, fatigue, vomiting and weakness. Pertinent negatives include no chest pain, congestion, coughing, diaphoresis, fever or visual change. Nothing aggravates the symptoms. She has tried nothing for the symptoms. The treatment provided no relief.  Abdominal Pain The primary symptoms of the illness include abdominal pain, fatigue, shortness of breath, vomiting and vaginal discharge. The primary symptoms of the illness do not include fever.  Additional symptoms associated with the illness include chills, anorexia, urgency and frequency. Symptoms associated with the illness do not include diaphoresis.  Vaginal Discharge Associated symptoms include abdominal pain, anorexia, chills, fatigue, vomiting and weakness. Pertinent negatives include no chest pain, congestion, coughing, diaphoresis, fever or visual change.           Past Medical History   Diagnosis  Date   .  Asthma          History reviewed. No pertinent past surgical history.   History reviewed. No pertinent family history.    History   Substance Use Topics   .  Smoking status:  Never Smoker    .  Smokeless tobacco:  Not on file   .  Alcohol Use:  No        Allergies: No Known Allergies    No prescriptions  prior to admission        Review of Systems  Constitutional: Positive for chills and fatigue. Negative for fever and diaphoresis.  HENT: Negative for congestion.   Eyes: Negative for blurred vision and double vision.  Respiratory: Positive for shortness of breath. Negative for cough.   Cardiovascular: Negative for chest pain.  Gastrointestinal: Positive for vomiting, abdominal pain and anorexia.  Genitourinary: Positive for urgency, frequency and vaginal discharge.  Neurological: Positive for weakness. Negative for dizziness.  Psychiatric/Behavioral:  Negative for depression.     Physical Exam    BP 123/69  Pulse 91  Resp 18  Ht  (1.6 m)  Wt 58.06 kg (128 lb)  BMI 22.68 kg/m2  LMP 07/02/2014 Height  (1.6 m), weight 128 lb (58.06 kg), last menstrual period 07/02/2014.   O2 saturation 100%   Physical Exam  Constitutional: She appears well-developed and well-nourished.  HENT:   Head: Normocephalic.  Eyes: Pupils are equal, round, and reactive to light.  Cardiovascular: Normal rate, regular rhythm, normal heart sounds and intact distal pulses.  Exam reveals no gallop and no friction rub.    No murmur heard. Respiratory: Effort normal and breath sounds normal. No respiratory distress. She has no wheezes. She has no rales. She exhibits no tenderness.  GI: Soft. Bowel sounds are normal. She exhibits no distension and no mass. There is no tenderness. There is no rebound and no guarding.  Pelvic exam: Cervix pink, visually closed, without lesion, small amount white thin discharge, vaginal walls and external genitalia normal Bimanual exam: Cervix 0/long/high, firm, anterior, neg CMT, uterus nontender, nonenlarged, adnexa without tenderness, enlargement, or mass    MAU Course    Procedures   Labs 1. CBC   2. Ultrasound   3. Beta HCG 4. ABO Blood Typing   5. Wet Prep   6. GC/CL    MDM Results for orders placed during the hospital encounter of 07/30/14 (from the past 24 hour(s))   URINALYSIS, ROUTINE W REFLEX MICROSCOPIC     Status: None     Collection Time      07/30/14  9:05 AM       Result  Value  Ref Range     Color, Urine  YELLOW   YELLOW     APPearance  CLEAR   CLEAR     Specific Gravity, Urine  1.010   1.005 - 1.030     pH  6.5   5.0 - 8.0     Glucose, UA  NEGATIVE   NEGATIVE mg/dL     Hgb urine dipstick  NEGATIVE   NEGATIVE     Bilirubin Urine  NEGATIVE   NEGATIVE     Ketones, ur  NEGATIVE   NEGATIVE mg/dL     Protein, ur  NEGATIVE   NEGATIVE mg/dL     Urobilinogen, UA  0.2   0.0 - 1.0 mg/dL      Nitrite  NEGATIVE   NEGATIVE     Leukocytes, UA  NEGATIVE   NEGATIVE   POCT PREGNANCY, URINE     Status: Abnormal     Collection Time      07/30/14  9:15 AM       Result  Value  Ref Range     Preg Test, Ur  POSITIVE (*)  NEGATIVE   WET PREP, GENITAL     Status: Abnormal     Collection Time      07/30/14 10:39 AM  Result  Value  Ref Range     Yeast Wet Prep HPF POC  NONE SEEN   NONE SEEN     Trich, Wet Prep  NONE SEEN   NONE SEEN     Clue Cells Wet Prep HPF POC  MODERATE (*)  NONE SEEN     WBC, Wet Prep HPF POC  FEW (*)  NONE SEEN   CBC     Status: Abnormal     Collection Time      07/30/14 10:49 AM       Result  Value  Ref Range     WBC  4.6   4.0 - 10.5 K/uL     RBC  3.99   3.87 - 5.11 MIL/uL     Hemoglobin  12.4   12.0 - 15.0 g/dL     HCT  16.1 (*)  09.6 - 46.0 %     MCV  88.5   78.0 - 100.0 fL     MCH  31.1   26.0 - 34.0 pg     MCHC  35.1   30.0 - 36.0 g/dL     RDW  04.5   40.9 - 15.5 %     Platelets  317   150 - 400 K/uL   HCG, QUANTITATIVE, PREGNANCY     Status: Abnormal     Collection Time      07/30/14 10:49 AM       Result  Value  Ref Range     hCG, Beta Chain, Quant, S  170 (*)  <5 mIU/mL      US Ob Comp Less 14 Wks   07/30/2014   CLINICAL DATA:  Abdominal pain and cramping for 2 days. Early pregnancy. LMP 07/02/2014. Beta HCG not available.  EXAM: OBSTETRIC <14 WK [place] AND TRANSVAGINAL OB US  TECHNIQUE: Both transabdominal and transvaginal ultrasound examinations were performed for complete evaluation of the gestation as well as the maternal uterus, adnexal regions, and pelvic cul-de-sac. Transvaginal technique was performed to assess early pregnancy.  COMPARISON:  Pelvic ultrasound 05/25/2014  FINDINGS: Intrauterine gestational sac: Not identified.  Maternal uterus/adnexae: Uterus is unremarkable in appearance, with endometrium approximately 11 mm in thickness. The ovaries are normal in appearance. Small amount of free fluid is present in the pelvis. No adnexal mass  is seen.  IMPRESSION: 1. Normal appearance of the uterus and ovaries. A gestational sac is not identified, which may reflect the early stage of pregnancy. Recommend correlation with beta HCG when available and close follow-up. 2. Small amount of free fluid in the pelvis.   Electronically Signed   By: Sebastian Ache   On: 07/30/2014 11:48    US Ob Transvaginal   07/30/2014   CLINICAL DATA:  Abdominal pain and cramping for 2 days. Early pregnancy. LMP 07/02/2014. Beta HCG not available.  EXAM: OBSTETRIC <14 WK [place] AND TRANSVAGINAL OB US  TECHNIQUE: Both transabdominal and transvaginal ultrasound examinations were performed for complete evaluation of the gestation as well as the maternal uterus, adnexal regions, and pelvic cul-de-sac. Transvaginal technique was performed to assess early pregnancy.  COMPARISON:  Pelvic ultrasound 05/25/2014  FINDINGS: Intrauterine gestational sac: Not identified.  Maternal uterus/adnexae: Uterus is unremarkable in appearance, with endometrium approximately 11 mm in thickness. The ovaries are normal in appearance. Small amount of free fluid is present in the pelvis. No adnexal mass is seen.  IMPRESSION: 1. Normal appearance of the uterus and ovaries. A gestational sac is not identified, which may reflect  the early stage of pregnancy. Recommend correlation with beta HCG when available and close follow-up. 2. Small amount of free fluid in the pelvis.   Electronically Signed   By: Sebastian Ache   On: 07/30/2014 11:48          Assessment and Plan    1. Pregnancy   2. Will come to the MAU in 48 hours for repeat quant hcg   Marena Chancy 07/30/2014, 9:48 AM    I have seen this patient and agree with the above PA student's note.  Ectopic precautions given.   LEFTWICH-KIRBY, LISA Certified Nurse-Midwife

## 2014-09-21 NOTE — MAU Provider Note (Signed)
Attestation of Attending Supervision of Advanced Practitioner (CNM/NP): Evaluation and management procedures were performed by the Advanced Practitioner under my supervision and collaboration. I have reviewed the Advanced Practitioner's note and chart, and I agree with the management and plan.  Stacey Turk H. 5:41 AM

## 2014-09-26 ENCOUNTER — Inpatient Hospital Stay (HOSPITAL_COMMUNITY)
Admission: AD | Admit: 2014-09-26 | Discharge: 2014-09-26 | Disposition: A | Discharge status: Home / Self Care | Payer: Medicaid Other | Source: Ambulatory Visit | Attending: Family Medicine | Admitting: Family Medicine

## 2014-09-26 ENCOUNTER — Encounter (HOSPITAL_COMMUNITY): Payer: Self-pay

## 2014-09-26 DIAGNOSIS — O2341 Unspecified infection of urinary tract in pregnancy, first trimester: Secondary | ICD-10-CM | POA: Insufficient documentation

## 2014-09-26 DIAGNOSIS — Z3A12 12 weeks gestation of pregnancy: Secondary | ICD-10-CM | POA: Insufficient documentation

## 2014-09-26 DIAGNOSIS — O4691 Antepartum hemorrhage, unspecified, first trimester: Secondary | ICD-10-CM | POA: Diagnosis present

## 2014-09-26 DIAGNOSIS — N390 Urinary tract infection, site not specified: Secondary | ICD-10-CM

## 2014-09-26 LAB — URINALYSIS, ROUTINE W REFLEX MICROSCOPIC
Bilirubin Urine: NEGATIVE
Glucose, UA: NEGATIVE mg/dL
Ketones, ur: NEGATIVE mg/dL
NITRITE: NEGATIVE
Protein, ur: NEGATIVE mg/dL
SPECIFIC GRAVITY, URINE: 1.015 (ref 1.005–1.030)
UROBILINOGEN UA: 2 mg/dL — AB (ref 0.0–1.0)
pH: 6 (ref 5.0–8.0)

## 2014-09-26 LAB — WET PREP, GENITAL
CLUE CELLS WET PREP: NONE SEEN
Trich, Wet Prep: NONE SEEN
YEAST WET PREP: NONE SEEN

## 2014-09-26 LAB — URINE MICROSCOPIC-ADD ON

## 2014-09-26 LAB — HIV ANTIBODY (ROUTINE TESTING W REFLEX): HIV 1&2 Ab, 4th Generation: NONREACTIVE

## 2014-09-26 MED ORDER — CEPHALEXIN 500 MG PO CAPS: 500.0000 mg | ORAL_CAPSULE | Freq: Four times a day (QID) | ORAL | Status: DC

## 2014-09-26 MED ORDER — PROMETHAZINE HCL 25 MG RE SUPP: 25.0000 mg | Freq: Four times a day (QID) | RECTAL | Status: DC | PRN

## 2014-09-26 NOTE — MAU Note (Signed)
I woke up and was having cramping. Went to [facility] and had dark red blood on tissue when i wiped

## 2014-09-26 NOTE — MAU Provider Note (Signed)
History     CSN: [id number]  Arrival date and time: 09/26/14 0541   First Provider Initiated Contact with Patient 09/26/14 [phone]      Chief Complaint  Patient presents with  . Vaginal Bleeding  . Abdominal Cramping   HPI Comments: Stacey Reid 18 y.o. G1P0000 [id number] presents to MAU after she awoke this morning, urinated, wiped and saw blood. She also had some cramping that has since resolved. She denies any sx of UTI. She denies any recent intercourse. She has an appointment with Dr Gaynell FaceMarshall on Oct 12th .   Vaginal Bleeding Associated symptoms include nausea.  Abdominal Cramping Associated symptoms include nausea.      Past Medical History  Diagnosis Date  . Asthma     Past Surgical History  Procedure Laterality Date  . No past surgeries      Family History  Problem Relation Age of Onset  . Asthma Mother   . Hypertension Mother     History  Substance Use Topics  . Smoking status: Never Smoker   . Smokeless tobacco: Not on file  . Alcohol Use: No    Allergies: No Known Allergies  Prescriptions prior to admission  Medication Sig Dispense Refill  . Prenatal Vit-Fe Fumarate-FA (PRENATAL MULTIVITAMIN) TABS tablet Take 1 tablet by mouth daily at 12 noon.      Marland Kitchen. PRESCRIPTION MEDICATION Place 1 suppository rectally daily as needed (nausea vomiting). Promethazine      . ranitidine (ZANTAC) 150 MG tablet Take 150 mg by mouth daily as needed for heartburn.        Review of Systems  Constitutional: Negative.   HENT: Negative.   Eyes: Negative.   Respiratory: Negative.   Cardiovascular: Negative.   Gastrointestinal: Positive for nausea.  Genitourinary: Positive for vaginal bleeding.       Vaginal bleeding  Musculoskeletal: Negative.   Skin: Negative.   Neurological: Negative.   Psychiatric/Behavioral: Negative.    Physical Exam   Blood pressure 109/68, pulse 76, temperature 98.6 F (37 C), resp. rate 18, height 5\' 2"  (1.575 m), weight 53.615 kg (118  lb 3.2 oz), last menstrual period 07/02/2014.  Physical Exam  Constitutional: She is oriented to person, place, and time. She appears well-developed and well-nourished. No distress.  +FHT  HENT:  Head: Normocephalic and atraumatic.  Eyes: Conjunctivae are normal. Pupils are equal, round, and reactive to light.  Cardiovascular: Normal rate, regular rhythm and normal heart sounds.   Respiratory: Effort normal and breath sounds normal.  GI: Soft. Bowel sounds are normal. She exhibits no distension. There is no tenderness. There is no rebound and no guarding.  Genitourinary:  Genital:External negative Vaginal:very small amount brown discharge Cervix:closed/ thick Bimanual:nontender   Musculoskeletal: Normal range of motion. She exhibits no edema and no tenderness.  Neurological: She is alert and oriented to person, place, and time.  Skin: Skin is warm and dry.  Psychiatric: She has a normal mood and affect. Her behavior is normal. Judgment and thought content normal.   Results for orders placed during the hospital encounter of 09/26/14 (from the past 24 hour(s))  URINALYSIS, ROUTINE W REFLEX MICROSCOPIC     Status: Abnormal   Collection Time    09/26/14  5:52 AM      Result Value Ref Range   Color, Urine YELLOW  YELLOW   APPearance CLEAR  CLEAR   Specific Gravity, Urine 1.015  1.005 - 1.030   pH 6.0  5.0 - 8.0   Glucose, UA NEGATIVE  NEGATIVE mg/dL   Hgb urine dipstick LARGE (*) NEGATIVE   Bilirubin Urine NEGATIVE  NEGATIVE   Ketones, ur NEGATIVE  NEGATIVE mg/dL   Protein, ur NEGATIVE  NEGATIVE mg/dL   Urobilinogen, UA 2.0 (*) 0.0 - 1.0 mg/dL   Nitrite NEGATIVE  NEGATIVE   Leukocytes, UA TRACE (*) NEGATIVE  URINE MICROSCOPIC-ADD ON     Status: Abnormal   Collection Time    09/26/14  5:52 AM      Result Value Ref Range   Squamous Epithelial / LPF FEW (*) RARE   WBC, UA 3-6  <3 WBC/hpf   RBC / HPF 0-2  <3 RBC/hpf   Bacteria, UA FEW (*) RARE   Urine-Other MUCOUS PRESENT     WET PREP, GENITAL     Status: Abnormal   Collection Time    09/26/14  7:20 AM      Result Value Ref Range   Yeast Wet Prep HPF POC NONE SEEN  NONE SEEN   Trich, Wet Prep NONE SEEN  NONE SEEN   Clue Cells Wet Prep HPF POC NONE SEEN  NONE SEEN   WBC, Wet Prep HPF POC FEW (*) NONE SEEN   .  MAU Course  Procedures  MDM  UA, urine culture, GC/ Chlamydia  Assessment and Plan   A: UTI  P: Keflex 500 mg 4 x day for 7 days Increase fluids/ rest Return to MAU if bleeding worsens Keep appointment with Dr Lilly Cove, Rubbie Battiest 09/26/2014, 8:24 AM

## 2014-09-26 NOTE — MAU Provider Note (Signed)
Attestation of Attending Supervision of Advanced Practitioner (PA/CNM/NP): Evaluation and management procedures were performed by the Advanced Practitioner under my supervision and collaboration.  I have reviewed the Advanced Practitioner's note and chart, and I agree with the management and plan.  Reva BoresPRATT,Srija Southard S, MD [facility] Attending 09/26/2014 8:42 AM

## 2014-09-26 NOTE — Progress Notes (Signed)
PATIENT STATES SHE HAS HER FIRST OB APPT ON October 12TH WITH DR MARSHALL.

## 2014-09-27 LAB — GC/CHLAMYDIA PROBE AMP
CT Probe RNA: NEGATIVE
GC Probe RNA: NEGATIVE

## 2014-09-27 LAB — URINE CULTURE
Colony Count: NO GROWTH
Culture: NO GROWTH
SPECIAL REQUESTS: NORMAL

## 2014-10-04 ENCOUNTER — Inpatient Hospital Stay (HOSPITAL_COMMUNITY)
Admission: AD | Admit: 2014-10-04 | Discharge: 2014-10-04 | Discharge status: Against Medical Advice | Payer: Medicaid Other | Source: Ambulatory Visit | Attending: Obstetrics | Admitting: Obstetrics

## 2014-10-04 LAB — OB RESULTS CONSOLE RPR: RPR: NONREACTIVE

## 2014-10-04 LAB — OB RESULTS CONSOLE HIV ANTIBODY (ROUTINE TESTING): HIV: NONREACTIVE

## 2014-10-04 LAB — OB RESULTS CONSOLE ABO/RH: RH Type: POSITIVE

## 2014-10-04 LAB — OB RESULTS CONSOLE GC/CHLAMYDIA
Chlamydia: NEGATIVE
Gonorrhea: NEGATIVE

## 2014-10-04 LAB — OB RESULTS CONSOLE HEPATITIS B SURFACE ANTIGEN: HEP B S AG: NEGATIVE

## 2014-10-04 LAB — OB RESULTS CONSOLE RUBELLA ANTIBODY, IGM: Rubella: NON-IMMUNE/NOT IMMUNE

## 2014-10-04 LAB — OB RESULTS CONSOLE ANTIBODY SCREEN: ANTIBODY SCREEN: NEGATIVE

## 2014-10-04 NOTE — MAU Note (Signed)
Pt states sent from Dr. Elsie StainMarshall's office for labs only.

## 2014-10-04 NOTE — MAU Note (Signed)
Dr. Gaynell FaceMarshall called, notified pt here for lab work, per Dr. Gaynell FaceMarshall, pt not supposed to be in MAU, is supposed to be at lab behind his office.

## 2014-10-15 ENCOUNTER — Encounter (HOSPITAL_COMMUNITY): Payer: Self-pay | Admitting: *Deleted

## 2014-10-15 ENCOUNTER — Inpatient Hospital Stay (HOSPITAL_COMMUNITY)
Admission: AD | Admit: 2014-10-15 | Discharge: 2014-10-15 | Disposition: A | Discharge status: Home / Self Care | Payer: Medicaid Other | Source: Ambulatory Visit | Attending: Obstetrics | Admitting: Obstetrics

## 2014-10-15 DIAGNOSIS — M545 Low back pain: Secondary | ICD-10-CM | POA: Diagnosis not present

## 2014-10-15 DIAGNOSIS — Z3A16 16 weeks gestation of pregnancy: Secondary | ICD-10-CM | POA: Insufficient documentation

## 2014-10-15 DIAGNOSIS — O26892 Other specified pregnancy related conditions, second trimester: Secondary | ICD-10-CM | POA: Insufficient documentation

## 2014-10-15 DIAGNOSIS — R109 Unspecified abdominal pain: Secondary | ICD-10-CM | POA: Diagnosis not present

## 2014-10-15 DIAGNOSIS — E860 Dehydration: Secondary | ICD-10-CM | POA: Insufficient documentation

## 2014-10-15 LAB — URINALYSIS, ROUTINE W REFLEX MICROSCOPIC
Bilirubin Urine: NEGATIVE
GLUCOSE, UA: NEGATIVE mg/dL
HGB URINE DIPSTICK: NEGATIVE
Ketones, ur: 15 mg/dL — AB
Nitrite: NEGATIVE
Protein, ur: NEGATIVE mg/dL
Specific Gravity, Urine: >1.03 — ABNORMAL HIGH (ref 1.005–1.030)
Urobilinogen, UA: 0.2 mg/dL (ref 0.0–1.0)
pH: 6 (ref 5.0–8.0)

## 2014-10-15 LAB — URINE MICROSCOPIC-ADD ON

## 2014-10-15 NOTE — MAU Note (Addendum)
Lower abd gets real tight.  Low back has been hurting. Has a discharge.  Been waking up with nose bleeds.  Frequency and urgency with urination- no discomfort

## 2014-10-15 NOTE — Discharge Instructions (Signed)
Drink at least 8 8-oz glasses of water every day. Take Tylenol 325 mg 2 tablets by mouth every 4 hours if needed for pain. Keep your appointment with Dr. Gaynell FaceMarshall Return if you have severe bleeding or worsening symptoms.

## 2014-10-15 NOTE — MAU Provider Note (Signed)
History     CSN: [id number]  Arrival date and time: 10/15/14 1514   Patient seen by provider at 1610    Chief Complaint  Patient presents with  . Abdominal Pain  . Back Pain  . Vaginal Discharge   HPI Stacey Reid 18 y.o. 7764w0d  Comes to MAU with lower abdominal pain and low back pain.  Has yellow vaginal discharge which she has had in this pregnancy.  Is not causing any itching or burning.  Is the same discharge that she had when she had a pelvic exam at Dr. Elsie StainMarshall's office.  Was here in MAU 2 weeks ago and her GC/Chlam tests were negative then.  Has had some vomiting and she takes phenergan.  She is thinking she has a urinary tract infection.    OB History   Grav Para Term Preterm Abortions TAB SAB Ect Mult Living   1 0 0 0 0 0 0 0 0 0       Past Medical History  Diagnosis Date  . Asthma     Past Surgical History  Procedure Laterality Date  . No past surgeries      Family History  Problem Relation Age of Onset  . Asthma Mother   . Hypertension Mother     History  Substance Use Topics  . Smoking status: Never Smoker   . Smokeless tobacco: Not on file  . Alcohol Use: No    Allergies: No Known Allergies  Prescriptions prior to admission  Medication Sig Dispense Refill  . cephALEXin (KEFLEX) 500 MG capsule Take 1 capsule (500 mg total) by mouth 4 (four) times daily.  28 capsule  0  . Prenatal Vit-Fe Fumarate-FA (PRENATAL MULTIVITAMIN) TABS tablet Take 1 tablet by mouth daily at 12 noon.      Marland Kitchen. PRESCRIPTION MEDICATION Place 1 suppository rectally daily as needed (nausea vomiting). Promethazine      . promethazine (PHENERGAN) 25 MG suppository Place 1 suppository (25 mg total) rectally every 6 (six) hours as needed for nausea or vomiting.  12 each  1  . ranitidine (ZANTAC) 150 MG tablet Take 150 mg by mouth daily as needed for heartburn.        Review of Systems  Constitutional: Negative for fever.  Gastrointestinal: Positive for nausea, vomiting  and abdominal pain.  Genitourinary:       Vaginal discharge. No vaginal bleeding. No dysuria.  Musculoskeletal: Positive for back pain.   Physical Exam   Blood pressure 115/61, pulse 94, temperature 98.2 F (36.8 C), temperature source Oral, resp. rate 16, height 5\' 4"  (1.626 m), weight 121 lb (54.885 kg), last menstrual period 07/02/2014.  Physical Exam  Nursing note and vitals reviewed. Constitutional: She is oriented to person, place, and time. She appears well-developed and well-nourished. No distress.  HENT:  Head: Normocephalic.  Eyes: EOM are normal.  Neck: Neck supple.  GI: Soft. There is no tenderness.  FHT heard with doppler  Genitourinary:  Bimanual exam Cervix - thick, closed and firm  Musculoskeletal: Normal range of motion.  Pain is all across her lower back just above the sacrum.  No CVA tenderness.  Neurological: She is alert and oriented to person, place, and time.  Skin: Skin is warm and dry.  Psychiatric: She has a normal mood and affect.    MAU Course  Procedures  MDM Results for orders placed during the hospital encounter of 10/15/14 (from the past 24 hour(s))  URINALYSIS, ROUTINE W REFLEX MICROSCOPIC  Status: Abnormal   Collection Time    10/15/14  3:38 PM      Result Value Ref Range   Color, Urine YELLOW  YELLOW   APPearance CLEAR  CLEAR   Specific Gravity, Urine >1.030 (*) 1.005 - 1.030   pH 6.0  5.0 - 8.0   Glucose, UA NEGATIVE  NEGATIVE mg/dL   Hgb urine dipstick NEGATIVE  NEGATIVE   Bilirubin Urine NEGATIVE  NEGATIVE   Ketones, ur 15 (*) NEGATIVE mg/dL   Protein, ur NEGATIVE  NEGATIVE mg/dL   Urobilinogen, UA 0.2  0.0 - 1.0 mg/dL   Nitrite NEGATIVE  NEGATIVE   Leukocytes, UA SMALL (*) NEGATIVE  URINE MICROSCOPIC-ADD ON     Status: Abnormal   Collection Time    10/15/14  3:38 PM      Result Value Ref Range   Squamous Epithelial / LPF FEW (*) RARE   WBC, UA 3-6  <3 WBC/hpf   RBC / HPF 0-2  <3 RBC/hpf   Bacteria, UA FEW (*) RARE    Urine-Other MUCOUS PRESENT     Client has a history of scoliosis and has chronic backpain - advised to do the exercises that have been recommended to her for scoliosis in the past.  Assessment and Plan  Dehydration in pregnancy   Plan Take Tylenol 325 mg 2 tablets by mouth every 4 hours if needed for pain. Drink at least 8 8-oz glasses of water every day. Keep your appointment in the office. Return if your symptoms worsen or if you have severe vaginal bleeding. Urine culture pending.  Remell Giaimo 10/15/2014, 4:13 PM

## 2014-10-16 LAB — URINE CULTURE
Colony Count: NO GROWTH
Culture: NO GROWTH

## 2014-10-25 ENCOUNTER — Encounter (HOSPITAL_COMMUNITY): Payer: Self-pay | Admitting: *Deleted

## 2014-12-24 DIAGNOSIS — R1110 Vomiting, unspecified: Secondary | ICD-10-CM

## 2014-12-24 HISTORY — DX: Vomiting, unspecified: R11.10

## 2014-12-24 NOTE — L&D Delivery Note (Signed)
Delivery Note At 1:24 AM a viable female was delivered via Vaginal, Spontaneous Delivery (Presentation: Right Occiput Posterior).  APGAR: , ; weight  .   Placenta status: , .  Cord:  with the following complications: .  Cord pH: not done  Anesthesia: Epidural Local  Episiotomy: Median Lacerations:   Suture Repair: 2.0 vicryl Est. Blood Loss (mL):    Mom to postpartum.  Baby to Couplet care / Skin to Skin.  Braven Wolk A 04/02/2015, 1:36 AM

## 2015-01-11 ENCOUNTER — Encounter (HOSPITAL_COMMUNITY): Payer: Self-pay | Admitting: *Deleted

## 2015-01-11 ENCOUNTER — Inpatient Hospital Stay (HOSPITAL_COMMUNITY)
Admission: AD | Admit: 2015-01-11 | Discharge: 2015-01-11 | Disposition: A | Discharge status: Home / Self Care | Payer: Medicaid Other | Source: Ambulatory Visit | Attending: Obstetrics | Admitting: Obstetrics

## 2015-01-11 DIAGNOSIS — R109 Unspecified abdominal pain: Secondary | ICD-10-CM | POA: Diagnosis not present

## 2015-01-11 DIAGNOSIS — E860 Dehydration: Secondary | ICD-10-CM | POA: Diagnosis not present

## 2015-01-11 DIAGNOSIS — N949 Unspecified condition associated with female genital organs and menstrual cycle: Secondary | ICD-10-CM | POA: Diagnosis not present

## 2015-01-11 DIAGNOSIS — O9989 Other specified diseases and conditions complicating pregnancy, childbirth and the puerperium: Secondary | ICD-10-CM

## 2015-01-11 DIAGNOSIS — O26899 Other specified pregnancy related conditions, unspecified trimester: Secondary | ICD-10-CM

## 2015-01-11 LAB — URINALYSIS, ROUTINE W REFLEX MICROSCOPIC
BILIRUBIN URINE: NEGATIVE
Glucose, UA: NEGATIVE mg/dL
Hgb urine dipstick: NEGATIVE
Ketones, ur: NEGATIVE mg/dL
Leukocytes, UA: NEGATIVE
Nitrite: NEGATIVE
Protein, ur: NEGATIVE mg/dL
Specific Gravity, Urine: <1.005 — ABNORMAL LOW (ref 1.005–1.030)
Urobilinogen, UA: 1 mg/dL (ref 0.0–1.0)
pH: 6.5 (ref 5.0–8.0)

## 2015-01-11 MED ORDER — LACTATED RINGERS IV BOLUS (SEPSIS)
1000.0000 mL | Freq: Once | INTRAVENOUS | Status: AC
  Administered 2015-01-11: 1000 mL via INTRAVENOUS

## 2015-01-11 NOTE — MAU Provider Note (Signed)
History     CSN: [id number]  Arrival date and time: 01/11/15 1725   First Provider Initiated Contact with Patient 01/11/15 1929      Chief Complaint  Patient presents with  . Abdominal Pain  . Vaginal Pain   HPI  19 y.o. G1P0000 3981w4d presents with bil pelvic into vaginal pain onset approx 10am today. Pt reports pain is cramping and intermittent, not at regular intervals. Pt reports decreased PO hydration intake x 2d with feelings of bloating and constipation. Reports last BM 1/18 and normal per pt. Pt denies n/v, fever/chills, vaginal bleeding/discharge or LOF. Pt reports no change in fetal movement with frequent feelings of movement Pt denies dysuria, hematuria. Reports urinary frequency without retention but feels like bladder unable to hold "usual amount".  Pt reports last intercourse 12/24/14, reports vaginal rest since 12/24/14.   RN note: Pt states abd pain started about 1040 this am. Pt states abd pain comes and go. Pt rates abd pain at a zero right now. Vaginal pain is pressure and aching. Denies vaginal bleeding, abnormal discharge, or ROM. Good fetal movement. Pt states she is having urinary frequency and doesn't empty her bladder  Past Medical History  Diagnosis Date  . Asthma     Past Surgical History  Procedure Laterality Date  . No past surgeries      Family History  Problem Relation Age of Onset  . Asthma Mother   . Hypertension Mother     History  Substance Use Topics  . Smoking status: Never Smoker   . Smokeless tobacco: Not on file  . Alcohol Use: No    Allergies: No Known Allergies  Prescriptions prior to admission  Medication Sig Dispense Refill Last Dose  . ferrous sulfate 220 (44 FE) MG/5ML solution Take 330 mg by mouth daily.   01/10/2015 at Unknown time  . Prenatal Vit-Fe Fumarate-FA (PRENATAL MULTIVITAMIN) TABS tablet Take 2 tablets by mouth daily at 12 noon.    10/14/2014 at Unknown time  . promethazine (PHENERGAN) 25 MG suppository Place 1  suppository (25 mg total) rectally every 6 (six) hours as needed for nausea or vomiting. (Patient not taking: Reported on 01/11/2015) 12 each 1 Not Taking at Unknown time    Review of Systems  Constitutional: Negative for fever, chills, weight loss and malaise/fatigue.  Respiratory: Negative for shortness of breath.   Cardiovascular: Negative for chest pain.  Gastrointestinal: Positive for abdominal pain. Negative for heartburn, nausea, diarrhea, constipation, blood in stool and melena.  Genitourinary: Positive for frequency. Negative for dysuria, urgency, hematuria and flank pain.  Musculoskeletal: Negative for myalgias, back pain, joint pain, falls and neck pain.  Neurological: Negative for weakness.   Physical Exam   Blood pressure 122/72, pulse 88, temperature 98.4 F (36.9 C), temperature source Oral, resp. rate 18, weight 146 lb 3.2 oz (66.316 kg), last menstrual period 07/02/2014, SpO2 100 %.  Physical Exam  Constitutional: She is oriented to person, place, and time. She appears well-developed and well-nourished.  Cardiovascular: Normal rate and normal heart sounds.   Respiratory: Effort normal and breath sounds normal. No respiratory distress.  GI: Soft. Bowel sounds are normal. She exhibits no distension and no mass. There is no tenderness. There is no rebound and no guarding.  No CVA tenderness.  Genitourinary:  NEFG, cervix high, posterior and closed. Labia majora is reddened and slightly swollen, normal per pt report.  Musculoskeletal: Normal range of motion.  Neurological: She is alert and oriented to person, place, and time.  Skin: Skin is warm and dry.  Psychiatric: She has a normal mood and affect. Her behavior is normal. Judgment and thought content normal.    MAU Course  Procedures  No results found.. Results for orders placed or performed during the hospital encounter of 01/11/15 (from the past 24 hour(s))  Urinalysis, Routine w reflex microscopic     Status:  Abnormal   Collection Time: 01/11/15  5:37 PM  Result Value Ref Range   Color, Urine YELLOW YELLOW   APPearance CLEAR CLEAR   Specific Gravity, Urine <1.005 (L) 1.005 - 1.030   pH 6.5 5.0 - 8.0   Glucose, UA NEGATIVE NEGATIVE mg/dL   Hgb urine dipstick NEGATIVE NEGATIVE   Bilirubin Urine NEGATIVE NEGATIVE   Ketones, ur NEGATIVE NEGATIVE mg/dL   Protein, ur NEGATIVE NEGATIVE mg/dL   Urobilinogen, UA 1.0 0.0 - 1.0 mg/dL   Nitrite NEGATIVE NEGATIVE   Leukocytes, UA NEGATIVE NEGATIVE   Cervix closed, +FHR on monitor. IVF to treat patient report of dehydration, Pt tolerates PO without difficulty. 1 liter LR IVF given- pt not having any abdominal pain after IVF FHR reactive- no decelerations noted; some Uterine irritability prior to IVF- none since Assessment and Plan  A: Dehydration P: Discharge Home OTC metamucil, colace PRN constipation Increase PO water consumption, increase daily dietary fiber. F/U with OB as scheduled 01/25/15.  LINEBERRY,SUSAN 01/11/2015, 7:38 PM

## 2015-01-11 NOTE — MAU Note (Addendum)
Pt states abd pain started about 1040 this am.  Pt states abd pain comes and go. Pt rates abd pain at a zero right now. Vaginal pain is pressure and aching.  Denies vaginal bleeding, abnormal discharge, or ROM.  Good fetal movement.  Pt states she is having urinary frequency and doesn't empty her bladder.

## 2015-01-11 NOTE — MAU Note (Signed)
Urine in lab 

## 2015-03-07 ENCOUNTER — Encounter (HOSPITAL_COMMUNITY): Payer: Self-pay | Admitting: *Deleted

## 2015-03-07 ENCOUNTER — Inpatient Hospital Stay (HOSPITAL_COMMUNITY)
Admission: AD | Admit: 2015-03-07 | Discharge: 2015-03-07 | Disposition: A | Discharge status: Home / Self Care | Payer: Medicaid Other | Source: Ambulatory Visit | Attending: Obstetrics | Admitting: Obstetrics

## 2015-03-07 DIAGNOSIS — Z3A35 35 weeks gestation of pregnancy: Secondary | ICD-10-CM | POA: Diagnosis not present

## 2015-03-07 DIAGNOSIS — O1203 Gestational edema, third trimester: Secondary | ICD-10-CM | POA: Diagnosis not present

## 2015-03-07 LAB — URINALYSIS, ROUTINE W REFLEX MICROSCOPIC
Bilirubin Urine: NEGATIVE
Glucose, UA: NEGATIVE mg/dL
HGB URINE DIPSTICK: NEGATIVE
Ketones, ur: NEGATIVE mg/dL
Nitrite: NEGATIVE
PH: 7 (ref 5.0–8.0)
Protein, ur: NEGATIVE mg/dL
UROBILINOGEN UA: 0.2 mg/dL (ref 0.0–1.0)

## 2015-03-07 LAB — URINE MICROSCOPIC-ADD ON

## 2015-03-07 NOTE — Discharge Instructions (Signed)
Third Trimester of Pregnancy The third trimester is from week 29 through week 42, months 7 through 9. The third trimester is a time when the fetus is growing rapidly. At the end of the ninth month, the fetus is about 20 inches in length and weighs 6-10 pounds.  BODY CHANGES Your body goes through many changes during pregnancy. The changes vary from woman to woman.   Your weight will continue to increase. You can expect to gain 25-35 pounds (11-16 kg) by the end of the pregnancy.  You may begin to get stretch marks on your hips, abdomen, and breasts.  You may urinate more often because the fetus is moving lower into your pelvis and pressing on your bladder.  You may develop or continue to have heartburn as a result of your pregnancy.  You may develop constipation because certain hormones are causing the muscles that push waste through your intestines to slow down.  You may develop hemorrhoids or swollen, bulging veins (varicose veins).  You may have pelvic pain because of the weight gain and pregnancy hormones relaxing your joints between the bones in your pelvis. Backaches may result from overexertion of the muscles supporting your posture.  You may have changes in your hair. These can include thickening of your hair, rapid growth, and changes in texture. Some women also have hair loss during or after pregnancy, or hair that feels dry or thin. Your hair will most likely return to normal after your baby is born.  Your breasts will continue to grow and be tender. A yellow discharge may leak from your breasts called colostrum.  Your belly button may stick out.  You may feel short of breath because of your expanding uterus.  You may notice the fetus "dropping," or moving lower in your abdomen.  You may have a bloody mucus discharge. This usually occurs a few days to a week before labor begins.  Your cervix becomes thin and soft (effaced) near your due date. WHAT TO EXPECT AT YOUR PRENATAL  EXAMS  You will have prenatal exams every 2 weeks until week 36. Then, you will have weekly prenatal exams. During a routine prenatal visit:  You will be weighed to make sure you and the fetus are growing normally.  Your blood pressure is taken.  Your abdomen will be measured to track your baby's growth.  The fetal heartbeat will be listened to.  Any test results from the previous visit will be discussed.  You may have a cervical check near your due date to see if you have effaced. At around 36 weeks, your caregiver will check your cervix. At the same time, your caregiver will also perform a test on the secretions of the vaginal tissue. This test is to determine if a type of bacteria, Group B streptococcus, is present. Your caregiver will explain this further. Your caregiver may ask you:  What your birth plan is.  How you are feeling.  If you are feeling the baby move.  If you have had any abnormal symptoms, such as leaking fluid, bleeding, severe headaches, or abdominal cramping.  If you have any questions. Other tests or screenings that may be performed during your third trimester include:  Blood tests that check for low iron levels (anemia).  Fetal testing to check the health, activity level, and growth of the fetus. Testing is done if you have certain medical conditions or if there are problems during the pregnancy. FALSE LABOR You may feel small, irregular contractions that   eventually go away. These are called Braxton Hicks contractions, or false labor. Contractions may last for hours, days, or even weeks before true labor sets in. If contractions come at regular intervals, intensify, or become painful, it is best to be seen by your caregiver.  SIGNS OF LABOR   Menstrual-like cramps.  Contractions that are 5 minutes apart or less.  Contractions that start on the top of the uterus and spread down to the lower abdomen and back.  A sense of increased pelvic pressure or back  pain.  A watery or bloody mucus discharge that comes from the vagina. If you have any of these signs before the 37th week of pregnancy, call your caregiver right away. You need to go to the hospital to get checked immediately. HOME CARE INSTRUCTIONS   Avoid all smoking, herbs, alcohol, and unprescribed drugs. These chemicals affect the formation and growth of the baby.  Follow your caregiver's instructions regarding medicine use. There are medicines that are either safe or unsafe to take during pregnancy.  Exercise only as directed by your caregiver. Experiencing uterine cramps is a good sign to stop exercising.  Continue to eat regular, healthy meals.  Wear a good support bra for breast tenderness.  Do not use hot tubs, steam rooms, or saunas.  Wear your seat belt at all times when driving.  Avoid raw meat, uncooked cheese, cat litter boxes, and soil used by cats. These carry germs that can cause birth defects in the baby.  Take your prenatal vitamins.  Try taking a stool softener (if your caregiver approves) if you develop constipation. Eat more high-fiber foods, such as fresh vegetables or fruit and whole grains. Drink plenty of fluids to keep your urine clear or pale yellow.  Take warm sitz baths to soothe any pain or discomfort caused by hemorrhoids. Use hemorrhoid cream if your caregiver approves.  If you develop varicose veins, wear support hose. Elevate your feet for 15 minutes, 3-4 times a day. Limit salt in your diet.  Avoid heavy lifting, wear low heal shoes, and practice good posture.  Rest a lot with your legs elevated if you have leg cramps or low back pain.  Visit your dentist if you have not gone during your pregnancy. Use a soft toothbrush to brush your teeth and be gentle when you floss.  A sexual relationship may be continued unless your caregiver directs you otherwise.  Do not travel far distances unless it is absolutely necessary and only with the approval  of your caregiver.  Take prenatal classes to understand, practice, and ask questions about the labor and delivery.  Make a trial run to the hospital.  Pack your hospital bag.  Prepare the baby's nursery.  Continue to go to all your prenatal visits as directed by your caregiver. SEEK MEDICAL CARE IF:  You are unsure if you are in labor or if your water has broken.  You have dizziness.  You have mild pelvic cramps, pelvic pressure, or nagging pain in your abdominal area.  You have persistent nausea, vomiting, or diarrhea.  You have a bad smelling vaginal discharge.  You have pain with urination. SEEK IMMEDIATE MEDICAL CARE IF:   You have a fever.  You are leaking fluid from your vagina.  You have spotting or bleeding from your vagina.  You have severe abdominal cramping or pain.  You have rapid weight loss or gain.  You have shortness of breath with chest pain.  You notice sudden or extreme swelling   of your face, hands, ankles, feet, or legs.  You have not felt your baby move in over an hour.  You have severe headaches that do not go away with medicine.  You have vision changes. Document Released: 12/04/2001 Document Revised: 12/15/2013 Document Reviewed: 02/10/2013 Exit[employer] Patient Information 2015 [employer]. This information is not intended to replace advice given to you by your health care provider. Make sure you discuss any questions you have with your health care provider.  

## 2015-03-07 NOTE — MAU Note (Signed)
Pt states swelling of eyes, ankles, feet, legs, & hands x 2 days. Denies blood pressure issues in the office. Denies abdominal pain/vaginal bleeding/LOF. Positive fetal movement.

## 2015-03-07 NOTE — MAU Provider Note (Signed)
History     CSN: [id number]  Arrival date and time: 03/07/15 0443   First Provider Initiated Contact with Patient 03/07/15 [phone]      Chief Complaint  Patient presents with  . Facial Swelling  . Leg Swelling   HPI  Stacey Reid is a 19 y.o. G1P0000 at 6664w3d who presents today with swelling in her hands and feet. She denies any headaches or blurry vision. She denies any complications with the pregnancy. She denies any contractions, VB or LOF. She states that the fetus has been moving normally.   Past Medical History  Diagnosis Date  . Asthma     Past Surgical History  Procedure Laterality Date  . No past surgeries      Family History  Problem Relation Age of Onset  . Asthma Mother   . Hypertension Mother     History  Substance Use Topics  . Smoking status: Never Smoker   . Smokeless tobacco: Not on file  . Alcohol Use: No    Allergies: No Known Allergies  Prescriptions prior to admission  Medication Sig Dispense Refill Last Dose  . ferrous sulfate 220 (44 FE) MG/5ML solution Take 330 mg by mouth daily.   01/10/2015 at Unknown time  . Prenatal Vit-Fe Fumarate-FA (PRENATAL MULTIVITAMIN) TABS tablet Take 2 tablets by mouth daily at 12 noon.    10/14/2014 at Unknown time  . promethazine (PHENERGAN) 25 MG suppository Place 1 suppository (25 mg total) rectally every 6 (six) hours as needed for nausea or vomiting. (Patient not taking: Reported on 01/11/2015) 12 each 1 Not Taking at Unknown time    Review of Systems  Eyes: Negative for blurred vision.  Gastrointestinal: Negative for abdominal pain.  Neurological: Negative for dizziness and headaches.  All other systems reviewed and are negative.  Physical Exam   Blood pressure 110/76, pulse 92, temperature 98.4 F (36.9 C), temperature source Oral, resp. rate 18, height 5\' 3"  (1.6 m), weight 74.027 kg (163 lb 3.2 oz), last menstrual period 07/02/2014.  Physical Exam  Nursing note and vitals  reviewed. Constitutional: She is oriented to person, place, and time. She appears well-developed and well-nourished. No distress.  Cardiovascular: Normal rate.   Respiratory: Effort normal.  GI: Soft. There is no tenderness.  Musculoskeletal: She exhibits edema (1+ non-pitting edema L>R hand and ankle ).  Neurological: She is alert and oriented to person, place, and time.  Skin: Skin is warm and dry.  Psychiatric: She has a normal mood and affect.   FHT 125, moderate with 15x15 accels, no decels Toco: no UCs  MAU Course  Procedures  Results for orders placed or performed during the hospital encounter of 03/07/15 (from the past 24 hour(s))  Urinalysis, Routine w reflex microscopic     Status: Abnormal   Collection Time: 03/07/15  4:52 AM  Result Value Ref Range   Color, Urine YELLOW YELLOW   APPearance CLEAR CLEAR   Specific Gravity, Urine <1.005 (L) 1.005 - 1.030   pH 7.0 5.0 - 8.0   Glucose, UA NEGATIVE NEGATIVE mg/dL   Hgb urine dipstick NEGATIVE NEGATIVE   Bilirubin Urine NEGATIVE NEGATIVE   Ketones, ur NEGATIVE NEGATIVE mg/dL   Protein, ur NEGATIVE NEGATIVE mg/dL   Urobilinogen, UA 0.2 0.0 - 1.0 mg/dL   Nitrite NEGATIVE NEGATIVE   Leukocytes, UA TRACE (A) NEGATIVE  Urine microscopic-add on     Status: None   Collection Time: 03/07/15  4:52 AM  Result Value Ref Range   Squamous  Epithelial / LPF RARE RARE   WBC, UA 0-2 <3 WBC/hpf   RBC / HPF 0-2 <3 RBC/hpf   Bacteria, UA RARE RARE     Assessment and Plan   1. Edema in pregnancy, third trimester    DC home Third trimester precautions reviewed Return to MAU as needed  Follow-up Information    Follow up with Kathreen Cosier, MD.   Specialty:  Obstetrics and Gynecology   Why:  As scheduled   Contact information:   [address] [place] [address] [phone]        Stacey Reid 03/07/2015, 5:37 AM

## 2015-03-15 ENCOUNTER — Inpatient Hospital Stay (HOSPITAL_COMMUNITY)
Admission: AD | Admit: 2015-03-15 | Discharge: 2015-03-15 | Disposition: A | Discharge status: Home / Self Care | Payer: Medicaid Other | Source: Ambulatory Visit | Attending: Obstetrics | Admitting: Obstetrics

## 2015-03-15 ENCOUNTER — Encounter (HOSPITAL_COMMUNITY): Payer: Self-pay | Admitting: *Deleted

## 2015-03-15 DIAGNOSIS — Z3A36 36 weeks gestation of pregnancy: Secondary | ICD-10-CM | POA: Insufficient documentation

## 2015-03-15 DIAGNOSIS — R109 Unspecified abdominal pain: Secondary | ICD-10-CM | POA: Insufficient documentation

## 2015-03-15 NOTE — MAU Provider Note (Signed)
Ms. Stacey Reid is a 19 y.o. G1P0000 at 4157w4d  who presents to MAU today complaining LOF and frequent contractions. She states slow leak of fluid. She denies vaginal bleeding or complications with the pregnancy. She reports good fetal movement.   BP 127/80 mmHg  Pulse 84  Temp(Src) 98.7 F (37.1 C) (Oral)  Resp 18  Ht 5\' 3"  (1.6 m)  Wt 166 lb (75.297 kg)  BMI 29.41 kg/m2  SpO2 100%  LMP 07/02/2014 GENERAL: Well-developed, well-nourished female in no acute distress.  HEENT: Normocephalic, atraumatic.  LUNGS: Normal effort HEART: Regular rate  ABDOMEN: Soft, nontender, nondistended.  PELVIC: Normal external female genitalia. Vagina is pink and rugated.  Normal discharge.  negative pooling. Gravid uterus.   EXTREMITIES: No cyanosis, clubbing, or edema  Fern - negative  Fetal Monitoring:  Baseline: 120 bpm, moderate variability, + accelerations, no decelerations Contractions: irregular, few  A: Membranes intact  P: Report given to RN to contact MD on call for further instructions  Marny LowensteinJulie N Eisha Chatterjee, PA-C 03/15/2015 11:10 PM

## 2015-03-15 NOTE — MAU Note (Signed)
Pt states that she felt a small gush of fluid at 1900 but has had nothing since. Pt states that baby is active, denies bleeding and states that she is contracting irregularly.

## 2015-03-15 NOTE — MAU Note (Signed)
Dr. Gaynell FaceMarshall states that patient may go home with d/c instructions. Pt. To return to the office for prenatal visit on Thurs.

## 2015-03-15 NOTE — Discharge Instructions (Signed)
Braxton Hicks Contractions °Contractions of the uterus can occur throughout pregnancy. Contractions are not always a sign that you are in labor.  °WHAT ARE BRAXTON HICKS CONTRACTIONS?  °Contractions that occur before labor are called Braxton Hicks contractions, or false labor. Toward the end of pregnancy (32-34 weeks), these contractions can develop more often and may become more forceful. This is not true labor because these contractions do not result in opening (dilatation) and thinning of the cervix. They are sometimes difficult to tell apart from true labor because these contractions can be forceful and people have different pain tolerances. You should not feel embarrassed if you go to the hospital with false labor. Sometimes, the only way to tell if you are in true labor is for your health care provider to look for changes in the cervix. °If there are no prenatal problems or other health problems associated with the pregnancy, it is completely safe to be sent home with false labor and await the onset of true labor. °HOW CAN YOU TELL THE DIFFERENCE BETWEEN TRUE AND FALSE LABOR? °False Labor °· The contractions of false labor are usually shorter and not as hard as those of true labor.   °· The contractions are usually irregular.   °· The contractions are often felt in the front of the lower abdomen and in the groin.   °· The contractions may go away when you walk around or change positions while lying down.   °· The contractions get weaker and are shorter lasting as time goes on.   °· The contractions do not usually become progressively stronger, regular, and closer together as with true labor.   °True Labor °· Contractions in true labor last 30-70 seconds, become very regular, usually become more intense, and increase in frequency.   °· The contractions do not go away with walking.   °· The discomfort is usually felt in the top of the uterus and spreads to the lower abdomen and low back.   °· True labor can be  determined by your health care provider with an exam. This will show that the cervix is dilating and getting thinner.   °WHAT TO REMEMBER °· Keep up with your usual exercises and follow other instructions given by your health care provider.   °· Take medicines as directed by your health care provider.   °· Keep your regular prenatal appointments.   °· Eat and drink lightly if you think you are going into labor.   °· If Braxton Hicks contractions are making you uncomfortable:   °¨ Change your position from lying down or resting to walking, or from walking to resting.   °¨ Sit and rest in a tub of warm water.   °¨ Drink 2-3 glasses of water. Dehydration may cause these contractions.   °¨ Do slow and deep breathing several times an hour.   °WHEN SHOULD I SEEK IMMEDIATE MEDICAL CARE? °Seek immediate medical care if: °· Your contractions become stronger, more regular, and closer together.   °· You have fluid leaking or gushing from your vagina.   °· You have a fever.   °· You pass blood-tinged mucus.   °· You have vaginal bleeding.   °· You have continuous abdominal pain.   °· You have low back pain that you never had before.   °· You feel your baby's head pushing down and causing pelvic pressure.   °· Your baby is not moving as much as it used to.   °Document Released: 12/10/2005 Document Revised: 12/15/2013 Document Reviewed: 09/21/2013 °[employer]® Patient Information ©2015 [employer]. This information is not intended to replace advice given to you by your health care   provider. Make sure you discuss any questions you have with your health care provider. ° °

## 2015-03-15 NOTE — MAU Note (Signed)
Pt reports upper abd burning and abd pain earlier. Also reports ? Leaking fluid since 1940.

## 2015-03-26 ENCOUNTER — Encounter (HOSPITAL_COMMUNITY): Payer: Self-pay | Admitting: *Deleted

## 2015-03-26 ENCOUNTER — Inpatient Hospital Stay (HOSPITAL_COMMUNITY)
Admission: AD | Admit: 2015-03-26 | Discharge: 2015-03-26 | Disposition: A | Discharge status: Home / Self Care | Payer: Medicaid Other | Source: Ambulatory Visit | Attending: Obstetrics | Admitting: Obstetrics

## 2015-03-26 DIAGNOSIS — Z3A38 38 weeks gestation of pregnancy: Secondary | ICD-10-CM | POA: Diagnosis not present

## 2015-03-26 NOTE — MAU Note (Signed)
Pt reports ctx started having ctx last night 1am. Got  Worse around 4pm. Denies SROM reports loosing her mucus plug. Good fetal movement reported.

## 2015-03-28 ENCOUNTER — Encounter (HOSPITAL_COMMUNITY): Payer: Self-pay | Admitting: *Deleted

## 2015-03-28 ENCOUNTER — Inpatient Hospital Stay (HOSPITAL_COMMUNITY)
Admission: AD | Admit: 2015-03-28 | Discharge: 2015-03-28 | Disposition: A | Discharge status: Home / Self Care | Payer: Medicaid Other | Source: Ambulatory Visit | Attending: Obstetrics | Admitting: Obstetrics

## 2015-03-28 DIAGNOSIS — R51 Headache: Secondary | ICD-10-CM | POA: Insufficient documentation

## 2015-03-28 DIAGNOSIS — Z3A38 38 weeks gestation of pregnancy: Secondary | ICD-10-CM | POA: Diagnosis not present

## 2015-03-28 DIAGNOSIS — O9989 Other specified diseases and conditions complicating pregnancy, childbirth and the puerperium: Secondary | ICD-10-CM | POA: Insufficient documentation

## 2015-03-28 LAB — COMPREHENSIVE METABOLIC PANEL
ALBUMIN: 2.9 g/dL — AB (ref 3.5–5.2)
ALK PHOS: 147 U/L — AB (ref 39–117)
ALT: 10 U/L (ref 0–35)
ANION GAP: 6 (ref 5–15)
AST: 19 U/L (ref 0–37)
CHLORIDE: 107 mmol/L (ref 96–112)
CO2: 23 mmol/L (ref 19–32)
Calcium: 8.6 mg/dL (ref 8.4–10.5)
Creatinine, Ser: 0.43 mg/dL — ABNORMAL LOW (ref 0.50–1.10)
GFR calc Af Amer: >90 mL/min (ref >90)
GFR calc non Af Amer: >90 mL/min (ref >90)
Glucose, Bld: 108 mg/dL — ABNORMAL HIGH (ref 70–99)
Potassium: 3.5 mmol/L (ref 3.5–5.1)
SODIUM: 136 mmol/L (ref 135–145)
TOTAL PROTEIN: 6.5 g/dL (ref 6.0–8.3)
Total Bilirubin: 0.6 mg/dL (ref 0.3–1.2)

## 2015-03-28 LAB — LACTATE DEHYDROGENASE: LDH: 166 U/L (ref 94–250)

## 2015-03-28 LAB — CBC
HCT: 29.3 % — ABNORMAL LOW (ref 36.0–46.0)
HEMOGLOBIN: 10.2 g/dL — AB (ref 12.0–15.0)
MCH: 31.3 pg (ref 26.0–34.0)
MCHC: 34.8 g/dL (ref 30.0–36.0)
MCV: 89.9 fL (ref 78.0–100.0)
Platelets: 255 10*3/uL (ref 150–400)
RBC: 3.26 MIL/uL — ABNORMAL LOW (ref 3.87–5.11)
RDW: 12.1 % (ref 11.5–15.5)
WBC: 9.8 10*3/uL (ref 4.0–10.5)

## 2015-03-28 MED ORDER — BUTALBITAL-APAP-CAFFEINE 50-325-40 MG PO TABS: 2.0000 | ORAL_TABLET | Freq: Once | ORAL | Status: DC

## 2015-03-28 NOTE — Discharge Instructions (Signed)
Hypertension During Pregnancy °Hypertension, or high blood pressure, is when there is extra pressure inside your blood vessels that carry blood from the heart to the rest of your body (arteries). It can happen at any time in life, including pregnancy. Hypertension during pregnancy can cause problems for you and your baby. Your baby might not weigh as much as he or she should at birth or might be born early (premature). Very bad cases of hypertension during pregnancy can be life-threatening.  °Different types of hypertension can occur during pregnancy. These include: °· Chronic hypertension. This happens when a woman has hypertension before pregnancy and it continues during pregnancy. °· Gestational hypertension. This is when hypertension develops during pregnancy. °· Preeclampsia or toxemia of pregnancy. This is a very serious type of hypertension that develops only during pregnancy. It affects the whole body and can be very dangerous for both mother and baby.   °Gestational hypertension and preeclampsia usually go away after your baby is born. Your blood pressure will likely stabilize within 6 weeks. Women who have hypertension during pregnancy have a greater chance of developing hypertension later in life or with future pregnancies. °RISK FACTORS °There are certain factors that make it more likely for you to develop hypertension during pregnancy. These include: °· Having hypertension before pregnancy. °· Having hypertension during a previous pregnancy. °· Being overweight. °· Being older than 40 years. °· Being pregnant with more than one baby. °· Having diabetes or kidney problems. °SIGNS AND SYMPTOMS °Chronic and gestational hypertension rarely cause symptoms. Preeclampsia has symptoms, which may include: °· Increased protein in your urine. Your health care provider will check for this at every prenatal visit. °· Swelling of your hands and face. °· Rapid weight gain. °· Headaches. °· Visual changes. °· Being  bothered by light. °· Abdominal pain, especially in the upper right area. °· Chest pain. °· Shortness of breath. °· Increased reflexes. °· Seizures. These occur with a more severe form of preeclampsia, called eclampsia. °DIAGNOSIS  °You may be diagnosed with hypertension during a regular prenatal exam. At each prenatal visit, you may have: °· Your blood pressure checked. °· A urine test to check for protein in your urine. °The type of hypertension you are diagnosed with depends on when you developed it. It also depends on your specific blood pressure reading. °· Developing hypertension before 20 weeks of pregnancy is consistent with chronic hypertension. °· Developing hypertension after 20 weeks of pregnancy is consistent with gestational hypertension. °· Hypertension with increased urinary protein is diagnosed as preeclampsia. °· Blood pressure measurements that stay above 160 systolic or 110 diastolic are a sign of severe preeclampsia. °TREATMENT °Treatment for hypertension during pregnancy varies. Treatment depends on the type of hypertension and how serious it is. °· If you take medicine for chronic hypertension, you may need to switch medicines. °¨ Medicines called ACE inhibitors should not be taken during pregnancy. °¨ Low-dose aspirin may be suggested for women who have risk factors for preeclampsia. °· If you have gestational hypertension, you may need to take a blood pressure medicine that is safe during pregnancy. Your health care provider will recommend the correct medicine. °· If you have severe preeclampsia, you may need to be in the hospital. Health care providers will watch you and your baby very closely. You also may need to take medicine called magnesium sulfate to prevent seizures and lower blood pressure. °· Sometimes, an early delivery is needed. This may be the case if the condition worsens. It would be   done to protect you and your baby. The only cure for preeclampsia is delivery.  Your health  care provider may recommend that you take one low-dose aspirin (81 mg) each day to help prevent high blood pressure during your pregnancy if you are at risk for preeclampsia. You may be at risk for preeclampsia if:  You had preeclampsia or eclampsia during a previous pregnancy.  Your baby did not grow as expected during a previous pregnancy.  You experienced preterm birth with a previous pregnancy.  You experienced a separation of the placenta from the uterus (placental abruption) during a previous pregnancy.  You experienced the loss of your baby during a previous pregnancy.  You are pregnant with more than one baby.  You have other medical conditions, such as diabetes or an autoimmune disease. HOME CARE INSTRUCTIONS  Schedule and keep all of your regular prenatal care appointments. This is important.  Take medicines only as directed by your health care provider. Tell your health care provider about all medicines you take.  Eat as little salt as possible.  Get regular exercise.  Do not drink alcohol.  Do not use tobacco products.  Do not drink products with caffeine.  Lie on your left side when resting. SEEK IMMEDIATE MEDICAL CARE IF:  You have severe abdominal pain.  You have sudden swelling in your hands, ankles, or face.  You gain 4 pounds (1.8 kg) or more in 1 week.  You vomit repeatedly.  You have vaginal bleeding.  You do not feel your baby moving as much.  You have a headache.  You have blurred or double vision.  You have muscle twitching or spasms.  You have shortness of breath.  You have blue fingernails or lips.  You have blood in your urine. MAKE SURE YOU:  Understand these instructions.  Will watch your condition.  Will get help right away if you are not doing well or get worse. Document Released: 08/28/2011 Document Revised: 04/26/2014 Document Reviewed: 07/09/2013 [facility] Patient Information 2015 [place], [place]. This information is not  intended to replace advice given to you by your health care provider. Make sure you discuss any questions you have with your health care provider. Braxton Hicks Contractions Contractions of the uterus can occur throughout pregnancy. Contractions are not always a sign that you are in labor.  WHAT ARE BRAXTON HICKS CONTRACTIONS?  Contractions that occur before labor are called Braxton Hicks contractions, or false labor. Toward the end of pregnancy (32-34 weeks), these contractions can develop more often and may become more forceful. This is not true labor because these contractions do not result in opening (dilatation) and thinning of the cervix. They are sometimes difficult to tell apart from true labor because these contractions can be forceful and people have different pain tolerances. You should not feel embarrassed if you go to the hospital with false labor. Sometimes, the only way to tell if you are in true labor is for your health care provider to look for changes in the cervix. If there are no prenatal problems or other health problems associated with the pregnancy, it is completely safe to be sent home with false labor and await the onset of true labor. HOW CAN YOU TELL THE DIFFERENCE BETWEEN TRUE AND FALSE LABOR? False Labor  The contractions of false labor are usually shorter and not as hard as those of true labor.   The contractions are usually irregular.   The contractions are often felt in the front of the lower  abdomen and in the groin.   °· The contractions may go away when you walk around or change positions while lying down.   °· The contractions get weaker and are shorter lasting as time goes on.   °· The contractions do not usually become progressively stronger, regular, and closer together as with true labor.   °True Labor °· Contractions in true labor last 30-70 seconds, become very regular, usually become more intense, and increase in frequency.   °· The contractions do not go  away with walking.   °· The discomfort is usually felt in the top of the uterus and spreads to the lower abdomen and low back.   °· True labor can be determined by your health care provider with an exam. This will show that the cervix is dilating and getting thinner.   °WHAT TO REMEMBER °· Keep up with your usual exercises and follow other instructions given by your health care provider.   °· Take medicines as directed by your health care provider.   °· Keep your regular prenatal appointments.   °· Eat and drink lightly if you think you are going into labor.   °· If Braxton Hicks contractions are making you uncomfortable:   °¨ Change your position from lying down or resting to walking, or from walking to resting.   °¨ Sit and rest in a tub of warm water.   °¨ Drink 2-3 glasses of water. Dehydration may cause these contractions.   °¨ Do slow and deep breathing several times an hour.   °WHEN SHOULD I SEEK IMMEDIATE MEDICAL CARE? °Seek immediate medical care if: °· Your contractions become stronger, more regular, and closer together.   °· You have fluid leaking or gushing from your vagina.   °· You have a fever.   °· You pass blood-tinged mucus.   °· You have vaginal bleeding.   °· You have continuous abdominal pain.   °· You have low back pain that you never had before.   °· You feel your baby's head pushing down and causing pelvic pressure.   °· Your baby is not moving as much as it used to.   °Document Released: 12/10/2005 Document Revised: 12/15/2013 Document Reviewed: 09/21/2013 °[employer]® Patient Information ©2015 [employer]. This information is not intended to replace advice given to you by your health care provider. Make sure you discuss any questions you have with your health care provider. ° °

## 2015-03-30 ENCOUNTER — Encounter (HOSPITAL_COMMUNITY): Payer: Self-pay | Admitting: *Deleted

## 2015-03-30 ENCOUNTER — Inpatient Hospital Stay (EMERGENCY_DEPARTMENT_HOSPITAL)
Admission: AD | Admit: 2015-03-30 | Discharge: 2015-03-30 | Disposition: A | Discharge status: Home / Self Care | Payer: Medicaid Other | Source: Ambulatory Visit | Attending: Obstetrics | Admitting: Obstetrics

## 2015-03-30 DIAGNOSIS — O131 Gestational [pregnancy-induced] hypertension without significant proteinuria, first trimester: Secondary | ICD-10-CM

## 2015-03-30 DIAGNOSIS — O133 Gestational [pregnancy-induced] hypertension without significant proteinuria, third trimester: Secondary | ICD-10-CM

## 2015-03-30 DIAGNOSIS — Z3A38 38 weeks gestation of pregnancy: Secondary | ICD-10-CM | POA: Diagnosis not present

## 2015-03-30 LAB — COMPREHENSIVE METABOLIC PANEL
ALBUMIN: 3 g/dL — AB (ref 3.5–5.2)
ALT: 9 U/L (ref 0–35)
AST: 18 U/L (ref 0–37)
Alkaline Phosphatase: 147 U/L — ABNORMAL HIGH (ref 39–117)
Anion gap: 7 (ref 5–15)
BILIRUBIN TOTAL: 0.6 mg/dL (ref 0.3–1.2)
BUN: 5 mg/dL — ABNORMAL LOW (ref 6–23)
CALCIUM: 8.8 mg/dL (ref 8.4–10.5)
CHLORIDE: 109 mmol/L (ref 96–112)
CO2: 21 mmol/L (ref 19–32)
CREATININE: 0.52 mg/dL (ref 0.50–1.10)
GFR calc Af Amer: >90 mL/min (ref >90)
GFR calc non Af Amer: >90 mL/min (ref >90)
Glucose, Bld: 100 mg/dL — ABNORMAL HIGH (ref 70–99)
Potassium: 3.7 mmol/L (ref 3.5–5.1)
SODIUM: 137 mmol/L (ref 135–145)
Total Protein: 6.5 g/dL (ref 6.0–8.3)

## 2015-03-30 LAB — URINALYSIS, ROUTINE W REFLEX MICROSCOPIC
Bilirubin Urine: NEGATIVE
Glucose, UA: NEGATIVE mg/dL
Hgb urine dipstick: NEGATIVE
KETONES UR: NEGATIVE mg/dL
Leukocytes, UA: NEGATIVE
NITRITE: NEGATIVE
PH: 6 (ref 5.0–8.0)
Protein, ur: NEGATIVE mg/dL
Specific Gravity, Urine: 1.015 (ref 1.005–1.030)
UROBILINOGEN UA: 0.2 mg/dL (ref 0.0–1.0)

## 2015-03-30 LAB — PROTEIN / CREATININE RATIO, URINE
Creatinine, Urine: 49 mg/dL
Total Protein, Urine: <6 mg/dL

## 2015-03-30 LAB — CBC
HEMATOCRIT: 29.7 % — AB (ref 36.0–46.0)
HEMOGLOBIN: 10.5 g/dL — AB (ref 12.0–15.0)
MCH: 31.3 pg (ref 26.0–34.0)
MCHC: 35.4 g/dL (ref 30.0–36.0)
MCV: 88.7 fL (ref 78.0–100.0)
Platelets: 268 10*3/uL (ref 150–400)
RBC: 3.35 MIL/uL — AB (ref 3.87–5.11)
RDW: 12.2 % (ref 11.5–15.5)
WBC: 11.7 10*3/uL — ABNORMAL HIGH (ref 4.0–10.5)

## 2015-03-30 LAB — URIC ACID: Uric Acid, Serum: 5.1 mg/dL (ref 2.4–7.0)

## 2015-03-30 LAB — LACTATE DEHYDROGENASE: LDH: 163 U/L (ref 94–250)

## 2015-03-30 MED ORDER — ACETAMINOPHEN 160 MG/5ML PO SOLN
650.0000 mg | Freq: Four times a day (QID) | ORAL | Status: DC | PRN
  Administered 2015-03-30: 650 mg via ORAL
  Filled 2015-03-30: qty 20.3

## 2015-03-30 NOTE — MAU Note (Signed)
Woke up this morning, everything was swollen. Called MD, went in, BP was elevated.  +protein in her urine. Seeing flashes- started 2 days ago; has an ongoing HA, denies epigastric pain.

## 2015-03-30 NOTE — Progress Notes (Signed)
Pt states she has a headache that is on her left side.

## 2015-03-30 NOTE — MAU Provider Note (Signed)
History     CSN: [id number]  Arrival date and time: 03/30/15 1547   None     Chief Complaint  Patient presents with  . Hypertension  . Leg Swelling  . Facial Swelling  . Headache   HPI Comments: [name] is a 19 y.o. female G1P0000 at 37w5dwho presents with elevated BP readings; [facility]. She was told that her urine has protein in it.   Hypertension This is a new problem. The current episode started today. The problem is uncontrolled. Associated symptoms include headaches, peripheral edema and shortness of breath. Pertinent negatives include no blurred vision. There are no associated agents to hypertension. There are no known risk factors for coronary artery disease.  Headache  Associated symptoms include abdominal pain (Abdominal pain all the time in lower abdomen. ) and nausea. Pertinent negatives include no blurred vision or vomiting. Her past medical history is significant for hypertension.       OB History    Gravida Para Term Preterm AB TAB SAB Ectopic Multiple Living   _0      Past Medical History  Diagnosis Date  . Asthma     Past Surgical History  Procedure Laterality Date  . No past surgeries      Family History  Problem Relation Age of Onset  . Asthma Mother   . Hypertension Mother     History  Substance Use Topics  . Smoking status: Never Smoker   . Smokeless tobacco: Not on file  . Alcohol Use: No    Allergies: No Known Allergies  Prescriptions prior to admission  Medication Sig Dispense Refill Last Dose  . ferrous sulfate 220 (44 FE) MG/5ML solution Take 330 mg by mouth daily.   03/29/2015 at Unknown time  . promethazine (PHENERGAN) 25 MG suppository Place 1 suppository (25 mg total) rectally every 6 (six) hours as needed for nausea or vomiting. (Patient not taking: Reported on 01/11/2015) 12 each 1 more than one month   Results for orders placed or performed  during the hospital encounter of 03/30/15 (from the past 48 hour(s))  Urinalysis, Routine w reflex microscopic     Status: None   Collection Time: 03/30/15  4:04 PM  Result Value Ref Range   Color, Urine YELLOW YELLOW   APPearance CLEAR CLEAR   Specific Gravity, Urine 1.015 1.005 - 1.030   pH 6.0 5.0 - 8.0   Glucose, UA NEGATIVE NEGATIVE mg/dL   Hgb urine dipstick NEGATIVE NEGATIVE   Bilirubin Urine NEGATIVE NEGATIVE   Ketones, ur NEGATIVE NEGATIVE mg/dL   Protein, ur NEGATIVE NEGATIVE mg/dL   Urobilinogen, UA 0.2 0.0 - 1.0 mg/dL   Nitrite NEGATIVE NEGATIVE   Leukocytes, UA NEGATIVE NEGATIVE    Comment: MICROSCOPIC NOT DONE ON URINES WITH NEGATIVE PROTEIN, BLOOD, LEUKOCYTES, NITRITE, OR GLUCOSE <1000 mg/dL.  Protein / creatinine ratio, urine     Status: None   Collection Time: 03/30/15  4:04 PM  Result Value Ref Range   Creatinine, Urine 49.00 mg/dL   Total Protein, Urine <6 mg/dL    Comment: NO NORMAL RANGE ESTABLISHED FOR THIS TEST REPEATED TO VERIFY    Protein Creatinine Ratio        0.00 - 0.15    Comment: RESULT BELOW REPORTABLE RANGE, UNABLE TO CALCULATE.   CBC     Status: Abnormal   Collection Time: 03/30/15  4:15 PM  Result Value Ref Range   WBC 11.7 (H) 4.0 - 10.5 K/uL   RBC 3.35 (L) 3.87 - 5.11 MIL/uL   Hemoglobin 10.5 (L) 12.0 - 15.0 g/dL   HCT 29.7 (L) 36.0 - 46.0 %   MCV 88.7 78.0 - 100.0 fL   MCH 31.3 26.0 - 34.0 pg   MCHC 35.4 30.0 - 36.0 g/dL   RDW 12.2 11.5 - 15.5 %   Platelets 268 150 - 400 K/uL  Comprehensive metabolic panel     Status: Abnormal   Collection Time: 03/30/15  4:15 PM  Result Value Ref Range   Sodium 137 135 - 145 mmol/L   Potassium 3.7 3.5 - 5.1 mmol/L   Chloride 109 96 - 112 mmol/L   CO2 21 19 - 32 mmol/L   Glucose, Bld 100 (H) 70 - 99 mg/dL   BUN 5 (L) 6 - 23 mg/dL   Creatinine, Ser 0.52 0.50 - 1.10 mg/dL   Calcium 8.8 8.4 - 10.5 mg/dL   Total Protein 6.5 6.0 - 8.3 g/dL   Albumin 3.0 (L) 3.5 - 5.2 g/dL   AST 18 0 - 37 U/L    ALT 9 0 - 35 U/L   Alkaline Phosphatase 147 (H) 39 - 117 U/L   Total Bilirubin 0.6 0.3 - 1.2 mg/dL   GFR calc non Af Amer >90 >90 mL/min   GFR calc Af Amer >90 >90 mL/min    Comment: (NOTE) The eGFR has been calculated using the CKD EPI equation. This calculation has not been validated in all clinical situations. eGFR's persistently <90 mL/min signify possible Chronic Kidney Disease.    Anion gap 7 5 - 15  Uric acid     Status: None   Collection Time: 03/30/15  4:15 PM  Result Value Ref Range   Uric Acid, Serum 5.1 2.4 - 7.0 mg/dL  Lactate dehydrogenase     Status: None   Collection Time: 03/30/15  4:15 PM  Result Value Ref Range   LDH 163 94 - 250 U/L     Review of Systems  Eyes: Negative for blurred vision.  Respiratory: Positive for shortness of breath.   Gastrointestinal: Positive for nausea and abdominal pain (Abdominal pain all the time in lower abdomen. ). Negative for vomiting.  Neurological: Positive for headaches.   Physical Exam   Blood pressure 132/88, pulse 86, temperature 98 F (36.7 C), temperature source Oral, resp. rate 16, height _0  (1.575 m), weight 76.658 kg (169 lb), last menstrual period 07/02/2014, SpO2 100 %.  Physical Exam  Constitutional: She is oriented to person, place, and time. She appears well-developed and well-nourished. No distress.  HENT:  Head: Normocephalic.  Nose: Nose normal.  Eyes: Pupils are equal, round, and reactive to light.  Neck: Neck supple.  Cardiovascular: Normal rate.   Respiratory: Effort normal.  GI: Soft. She exhibits no distension. There is no tenderness.  Musculoskeletal: Normal range of motion.  Neurological: She is alert and oriented to person, place, and time. She has normal reflexes.  Negative clonus  Negative swelling in bilateral lower extremities   Skin: Skin is warm. She is not diaphoretic.  Psychiatric: Her behavior is normal.    Fetal Tracing: Baseline: 130 bpm  Variability:  Moderate Accelerations: 15x15 Decelerations: none Toco: Irregular pattern with UI     MAU Course  Procedures  None  MDM  PIH labs  Discussed labs and BP readings with Dr. Ruthann Cancer.  Tylenol liquid given for HA   Assessment and  Plan   A:  1. Pregnancy induced hypertension, third trimester      P;  Discharge home in stable condition Preeclampsia precautions Return to MAU if symptoms worsen Ok to take tylenol as directed on the bottle Follow up with OB as scheduled.     Lezlie Lye, NP 03/30/2015 4:55 PM

## 2015-03-30 NOTE — Discharge Instructions (Signed)

## 2015-03-30 NOTE — MAU Note (Signed)
Pt states she woke this morning 0340 am and my whole right side was swollen and pt states she changed position to left side and with in 10 min left side was swollen. Pt states she tried to drink water and felt her throat was swollen as well pt walking and pt states she was seen in the office and then sent to MAU.

## 2015-03-31 ENCOUNTER — Inpatient Hospital Stay (HOSPITAL_COMMUNITY)
Admission: AD | Admit: 2015-03-31 | Discharge: 2015-04-04 | DRG: 775 | Disposition: A | Discharge status: Home / Self Care | Payer: Medicaid Other | Source: Ambulatory Visit | Attending: Obstetrics | Admitting: Obstetrics

## 2015-03-31 ENCOUNTER — Encounter (HOSPITAL_COMMUNITY): Payer: Self-pay | Admitting: *Deleted

## 2015-03-31 DIAGNOSIS — O133 Gestational [pregnancy-induced] hypertension without significant proteinuria, third trimester: Principal | ICD-10-CM | POA: Diagnosis present

## 2015-03-31 DIAGNOSIS — J45909 Unspecified asthma, uncomplicated: Secondary | ICD-10-CM | POA: Diagnosis present

## 2015-03-31 DIAGNOSIS — O9952 Diseases of the respiratory system complicating childbirth: Secondary | ICD-10-CM | POA: Diagnosis present

## 2015-03-31 DIAGNOSIS — Z8249 Family history of ischemic heart disease and other diseases of the circulatory system: Secondary | ICD-10-CM

## 2015-03-31 DIAGNOSIS — Z3A39 39 weeks gestation of pregnancy: Secondary | ICD-10-CM | POA: Diagnosis present

## 2015-03-31 DIAGNOSIS — R030 Elevated blood-pressure reading, without diagnosis of hypertension: Secondary | ICD-10-CM | POA: Diagnosis present

## 2015-03-31 DIAGNOSIS — O09299 Supervision of pregnancy with other poor reproductive or obstetric history, unspecified trimester: Secondary | ICD-10-CM | POA: Diagnosis present

## 2015-03-31 DIAGNOSIS — Z3A38 38 weeks gestation of pregnancy: Secondary | ICD-10-CM | POA: Diagnosis not present

## 2015-03-31 LAB — CBC
HCT: 28.7 % — ABNORMAL LOW (ref 36.0–46.0)
HCT: 29.3 % — ABNORMAL LOW (ref 36.0–46.0)
HEMOGLOBIN: 10.1 g/dL — AB (ref 12.0–15.0)
Hemoglobin: 10 g/dL — ABNORMAL LOW (ref 12.0–15.0)
MCH: 31.1 pg (ref 26.0–34.0)
MCH: 31.1 pg (ref 26.0–34.0)
MCHC: 34.8 g/dL (ref 30.0–36.0)
MCHC: 34.5 g/dL (ref 30.0–36.0)
MCV: 89.1 fL (ref 78.0–100.0)
MCV: 90.2 fL (ref 78.0–100.0)
PLATELETS: 267 10*3/uL (ref 150–400)
Platelets: 249 10*3/uL (ref 150–400)
RBC: 3.22 MIL/uL — ABNORMAL LOW (ref 3.87–5.11)
RBC: 3.25 MIL/uL — AB (ref 3.87–5.11)
RDW: 12.3 % (ref 11.5–15.5)
RDW: 12.3 % (ref 11.5–15.5)
WBC: 10.9 10*3/uL — ABNORMAL HIGH (ref 4.0–10.5)
WBC: 11.3 10*3/uL — AB (ref 4.0–10.5)

## 2015-03-31 LAB — URINALYSIS, ROUTINE W REFLEX MICROSCOPIC
BILIRUBIN URINE: NEGATIVE
Glucose, UA: NEGATIVE mg/dL
Hgb urine dipstick: NEGATIVE
KETONES UR: NEGATIVE mg/dL
LEUKOCYTES UA: NEGATIVE
NITRITE: NEGATIVE
PH: 5.5 (ref 5.0–8.0)
Protein, ur: NEGATIVE mg/dL
UROBILINOGEN UA: 0.2 mg/dL (ref 0.0–1.0)

## 2015-03-31 LAB — COMPREHENSIVE METABOLIC PANEL
ALBUMIN: 2.9 g/dL — AB (ref 3.5–5.2)
ALK PHOS: 141 U/L — AB (ref 39–117)
ALT: 9 U/L (ref 0–35)
ANION GAP: 5 (ref 5–15)
AST: 17 U/L (ref 0–37)
BUN: <5 mg/dL — ABNORMAL LOW (ref 6–23)
CO2: 23 mmol/L (ref 19–32)
CREATININE: 0.51 mg/dL (ref 0.50–1.10)
Calcium: 8.4 mg/dL (ref 8.4–10.5)
Chloride: 107 mmol/L (ref 96–112)
GFR calc Af Amer: >90 mL/min (ref >90)
Glucose, Bld: 89 mg/dL (ref 70–99)
POTASSIUM: 3.7 mmol/L (ref 3.5–5.1)
Sodium: 135 mmol/L (ref 135–145)
Total Bilirubin: 0.4 mg/dL (ref 0.3–1.2)
Total Protein: 6.3 g/dL (ref 6.0–8.3)

## 2015-03-31 LAB — PROTEIN / CREATININE RATIO, URINE: CREATININE, URINE: 51 mg/dL

## 2015-03-31 LAB — GROUP B STREP BY PCR: GROUP B STREP BY PCR: NEGATIVE

## 2015-03-31 LAB — TYPE AND SCREEN
ABO/RH(D): O POS
Antibody Screen: NEGATIVE

## 2015-03-31 LAB — LACTATE DEHYDROGENASE: LDH: 168 U/L (ref 94–250)

## 2015-03-31 LAB — URIC ACID: Uric Acid, Serum: 4.5 mg/dL (ref 2.4–7.0)

## 2015-03-31 MED ORDER — LACTATED RINGERS IV SOLN
INTRAVENOUS | Status: DC
  Administered 2015-03-31 – 2015-04-01 (×4): via INTRAVENOUS

## 2015-03-31 MED ORDER — OXYTOCIN 40 UNITS IN LACTATED RINGERS INFUSION - SIMPLE MED: 62.5000 mL/h | INTRAVENOUS | Status: DC

## 2015-03-31 MED ORDER — ACETAMINOPHEN 160 MG/5ML PO SOLN
650.0000 mg | ORAL | Status: DC | PRN
  Administered 2015-03-31: 650 mg via ORAL
  Filled 2015-03-31: qty 20.3

## 2015-03-31 MED ORDER — TERBUTALINE SULFATE 1 MG/ML IJ SOLN: 0.2500 mg | Freq: Once | INTRAMUSCULAR | Status: AC | PRN

## 2015-03-31 MED ORDER — ACETAMINOPHEN 325 MG PO TABS: 650.0000 mg | ORAL_TABLET | ORAL | Status: DC | PRN

## 2015-03-31 MED ORDER — LIDOCAINE HCL (PF) 1 % IJ SOLN
30.0000 mL | INTRAMUSCULAR | Status: DC | PRN
  Administered 2015-04-02: 30 mL via SUBCUTANEOUS
  Filled 2015-03-31: qty 30

## 2015-03-31 MED ORDER — OXYCODONE-ACETAMINOPHEN 5-325 MG PO TABS: 2.0000 | ORAL_TABLET | ORAL | Status: DC | PRN

## 2015-03-31 MED ORDER — ONDANSETRON HCL 4 MG/2ML IJ SOLN: 4.0000 mg | Freq: Four times a day (QID) | INTRAMUSCULAR | Status: DC | PRN

## 2015-03-31 MED ORDER — OXYCODONE-ACETAMINOPHEN 5-325 MG PO TABS: 1.0000 | ORAL_TABLET | ORAL | Status: DC | PRN

## 2015-03-31 MED ORDER — CITRIC ACID-SODIUM CITRATE 334-500 MG/5ML PO SOLN
30.0000 mL | ORAL | Status: DC | PRN
  Filled 2015-03-31: qty 30

## 2015-03-31 MED ORDER — MAGNESIUM SULFATE 40 G IN LACTATED RINGERS - SIMPLE
2.0000 g/h | INTRAVENOUS | Status: DC
  Administered 2015-03-31 – 2015-04-01 (×2): 2 g/h via INTRAVENOUS
  Filled 2015-03-31 (×2): qty 500

## 2015-03-31 MED ORDER — OXYTOCIN BOLUS FROM INFUSION: 500.0000 mL | INTRAVENOUS | Status: DC

## 2015-03-31 MED ORDER — LACTATED RINGERS IV SOLN: 500.0000 mL | INTRAVENOUS | Status: DC | PRN

## 2015-03-31 MED ORDER — MISOPROSTOL 25 MCG QUARTER TABLET
25.0000 ug | ORAL_TABLET | ORAL | Status: DC | PRN
  Administered 2015-04-01: 25 ug via VAGINAL
  Filled 2015-03-31: qty 1
  Filled 2015-03-31: qty 0.25

## 2015-03-31 NOTE — Progress Notes (Signed)
Informed Dr Clearance CootsHarper of BP and lab results, Magnesium Sulfate infusion started. Informed of headache and continuing RUQ pain. Informed patient is having regular uterine irritability/small contractions with cramping. Dr Clearance CootsHarper said to not administer cytotec at this time. Will evaluate later.

## 2015-03-31 NOTE — MAU Note (Signed)
Pt reports her B/P was high earlier today, complains of pain in right chest area that radiates to her back. States her right arm feels heavy and she is nauseated.

## 2015-03-31 NOTE — MAU Provider Note (Signed)
History     CSN: [id number]  Arrival date and time: 03/31/15 [id number]   First Provider Initiated Contact with Patient 03/31/15 1923      No chief complaint on file.  HPI Comments: Stacey Reid is a [age] y.o. G1P0000 at 4954w6d who presents today with elevated blood pressure. She states that she has been followed by her OB for elevated blood pressures. However, yesterday after she was seen here she developed pain in the RUQ, and the right side of her chest and radiating down her right arm. She also reports a headache, she denies any blurry vision. She denies any VB or LOF. She has had some contractions, and the fetus has been moving normally.    Chest Pain  This is a new problem. The current episode started yesterday. The onset quality is sudden. The problem has been gradually worsening. The pain is at a severity of 8/10. Quality: achiness  The pain radiates to the right arm and right shoulder. Associated symptoms include headaches and nausea. Pertinent negatives include no shortness of breath. Vomiting: 2x today. She has tried acetaminophen for the symptoms. The treatment provided no relief. There are no known risk factors.     Past Medical History  Diagnosis Date  . Asthma     Past Surgical History  Procedure Laterality Date  . No past surgeries      Family History  Problem Relation Age of Onset  . Asthma Mother   . Hypertension Mother     History  Substance Use Topics  . Smoking status: Never Smoker   . Smokeless tobacco: Not on file  . Alcohol Use: No    Allergies: No Known Allergies  Prescriptions prior to admission  Medication Sig Dispense Refill Last Dose  . ferrous sulfate 220 (44 FE) MG/5ML solution Take 330 mg by mouth daily.   Past Week at Unknown time  . promethazine (PHENERGAN) 25 MG suppository Place 1 suppository (25 mg total) rectally every 6 (six) hours as needed for nausea or vomiting. (Patient not taking: Reported on 01/11/2015) 12 each 1 Not Taking at  Unknown time    Review of Systems  Eyes: Negative for blurred vision.  Respiratory: Negative for shortness of breath.   Cardiovascular: Positive for chest pain.  Gastrointestinal: Positive for nausea. Negative for diarrhea and constipation. Vomiting: 2x today.  Neurological: Positive for headaches.   Physical Exam   Blood pressure 130/74, pulse 86, temperature 98.4 F (36.9 C), temperature source Oral, resp. rate 18, height 5\' 2"  (1.575 m), weight 77.565 kg (171 lb), last menstrual period 07/02/2014, SpO2 100 %.  Physical Exam  Constitutional: She is oriented to person, place, and time. She appears well-developed and well-nourished. No distress.  Cardiovascular: Normal rate and regular rhythm.   Respiratory: Effort normal and breath sounds normal. No respiratory distress.  GI: There is no tenderness.  Neurological: She is alert and oriented to person, place, and time. She has normal reflexes.  2 beats of clonus   Skin: Skin is warm and dry.  Psychiatric: She has a normal mood and affect.   FHT 140, moderate with 15x15 accels, no decels Toco: rare UCs   Results for orders placed or performed during the hospital encounter of 03/31/15 (from the past 24 hour(s))  Urinalysis, Routine w reflex microscopic     Status: Abnormal   Collection Time: 03/31/15  7:15 PM  Result Value Ref Range   Color, Urine YELLOW YELLOW   APPearance CLEAR CLEAR   Specific  Gravity, Urine <1.005 (L) 1.005 - 1.030   pH 5.5 5.0 - 8.0   Glucose, UA NEGATIVE NEGATIVE mg/dL   Hgb urine dipstick NEGATIVE NEGATIVE   Bilirubin Urine NEGATIVE NEGATIVE   Ketones, ur NEGATIVE NEGATIVE mg/dL   Protein, ur NEGATIVE NEGATIVE mg/dL   Urobilinogen, UA 0.2 0.0 - 1.0 mg/dL   Nitrite NEGATIVE NEGATIVE   Leukocytes, UA NEGATIVE NEGATIVE  Protein / creatinine ratio, urine     Status: None   Collection Time: 03/31/15  7:15 PM  Result Value Ref Range   Creatinine, Urine 51.00 mg/dL   Total Protein, Urine <6 mg/dL    Protein Creatinine Ratio        0.00 - 0.15  CBC     Status: Abnormal   Collection Time: 03/31/15  7:44 PM  Result Value Ref Range   WBC 10.9 (H) 4.0 - 10.5 K/uL   RBC 3.22 (L) 3.87 - 5.11 MIL/uL   Hemoglobin 10.0 (L) 12.0 - 15.0 g/dL   HCT 04.5 (L) 40.9 - 81.1 %   MCV 89.1 78.0 - 100.0 fL   MCH 31.1 26.0 - 34.0 pg   MCHC 34.8 30.0 - 36.0 g/dL   RDW 91.4 78.2 - 95.6 %   Platelets 249 150 - 400 K/uL  Comprehensive metabolic panel     Status: Abnormal   Collection Time: 03/31/15  7:44 PM  Result Value Ref Range   Sodium 135 135 - 145 mmol/L   Potassium 3.7 3.5 - 5.1 mmol/L   Chloride 107 96 - 112 mmol/L   CO2 23 19 - 32 mmol/L   Glucose, Bld 89 70 - 99 mg/dL   BUN <5 (L) 6 - 23 mg/dL   Creatinine, Ser 2.13 0.50 - 1.10 mg/dL   Calcium 8.4 8.4 - 08.6 mg/dL   Total Protein 6.3 6.0 - 8.3 g/dL   Albumin 2.9 (L) 3.5 - 5.2 g/dL   AST 17 0 - 37 U/L   ALT 9 0 - 35 U/L   Alkaline Phosphatase 141 (H) 39 - 117 U/L   Total Bilirubin 0.4 0.3 - 1.2 mg/dL   GFR calc non Af Amer >90 >90 mL/min   GFR calc Af Amer >90 >90 mL/min   Anion gap 5 5 - 15  Uric acid     Status: None   Collection Time: 03/31/15  7:44 PM  Result Value Ref Range   Uric Acid, Serum 4.5 2.4 - 7.0 mg/dL  Lactate dehydrogenase     Status: None   Collection Time: 03/31/15  7:44 PM  Result Value Ref Range   LDH 168 94 - 250 U/L    MAU Course  Procedures  MDM 2119: D/W Dr. Clearance Coots will admit to labor and delivery.   Assessment and Plan  Pre-eclampsia Admit to labor and delivery  Routine orders Mag sulfate 4 gram bolus and 2 grams per hour  Rapid GBS today, no GBS on prenatal records   Tawnya Crook 03/31/2015, 8:25 PM

## 2015-04-01 ENCOUNTER — Inpatient Hospital Stay (HOSPITAL_COMMUNITY): Payer: Medicaid Other | Admitting: Anesthesiology

## 2015-04-01 LAB — CBC
HEMATOCRIT: 30 % — AB (ref 36.0–46.0)
Hemoglobin: 10.6 g/dL — ABNORMAL LOW (ref 12.0–15.0)
MCH: 31.4 pg (ref 26.0–34.0)
MCHC: 35.3 g/dL (ref 30.0–36.0)
MCV: 88.8 fL (ref 78.0–100.0)
Platelets: 247 10*3/uL (ref 150–400)
RBC: 3.38 MIL/uL — AB (ref 3.87–5.11)
RDW: 12.5 % (ref 11.5–15.5)
WBC: 13.9 10*3/uL — ABNORMAL HIGH (ref 4.0–10.5)

## 2015-04-01 LAB — HIV ANTIBODY (ROUTINE TESTING W REFLEX): HIV Screen 4th Generation wRfx: NONREACTIVE

## 2015-04-01 LAB — RPR: RPR Ser Ql: NONREACTIVE

## 2015-04-01 LAB — OB RESULTS CONSOLE GBS: GBS: NEGATIVE

## 2015-04-01 LAB — ABO/RH: ABO/RH(D): O POS

## 2015-04-01 MED ORDER — DIPHENHYDRAMINE HCL 50 MG/ML IJ SOLN: 12.5000 mg | INTRAMUSCULAR | Status: DC | PRN

## 2015-04-01 MED ORDER — FENTANYL 2.5 MCG/ML BUPIVACAINE 1/10 % EPIDURAL INFUSION (WH - ANES)
INTRAMUSCULAR | Status: DC | PRN
  Administered 2015-04-01: 14 mL/h via EPIDURAL

## 2015-04-01 MED ORDER — OXYTOCIN 40 UNITS IN LACTATED RINGERS INFUSION - SIMPLE MED
1.0000 m[IU]/min | INTRAVENOUS | Status: DC
  Administered 2015-04-01: 2 m[IU]/min via INTRAVENOUS
  Filled 2015-04-01: qty 1000

## 2015-04-01 MED ORDER — EPHEDRINE 5 MG/ML INJ
10.0000 mg | INTRAVENOUS | Status: DC | PRN
  Filled 2015-04-01: qty 2

## 2015-04-01 MED ORDER — TERBUTALINE SULFATE 1 MG/ML IJ SOLN: 0.2500 mg | Freq: Once | INTRAMUSCULAR | Status: AC | PRN

## 2015-04-01 MED ORDER — FENTANYL 2.5 MCG/ML BUPIVACAINE 1/10 % EPIDURAL INFUSION (WH - ANES)
14.0000 mL/h | INTRAMUSCULAR | Status: DC | PRN
  Filled 2015-04-01: qty 125

## 2015-04-01 MED ORDER — PHENYLEPHRINE 40 MCG/ML (10ML) SYRINGE FOR IV PUSH (FOR BLOOD PRESSURE SUPPORT)
80.0000 ug | PREFILLED_SYRINGE | INTRAVENOUS | Status: DC | PRN
  Filled 2015-04-01: qty 2
  Filled 2015-04-01: qty 20

## 2015-04-01 MED ORDER — PHENYLEPHRINE 40 MCG/ML (10ML) SYRINGE FOR IV PUSH (FOR BLOOD PRESSURE SUPPORT)
80.0000 ug | PREFILLED_SYRINGE | INTRAVENOUS | Status: DC | PRN
  Filled 2015-04-01: qty 2

## 2015-04-01 MED ORDER — LACTATED RINGERS IV SOLN: 500.0000 mL | Freq: Once | INTRAVENOUS | Status: DC

## 2015-04-01 MED ORDER — BUTORPHANOL TARTRATE 1 MG/ML IJ SOLN
1.0000 mg | INTRAMUSCULAR | Status: DC | PRN
Start: 2015-04-01 — End: 2015-04-02
  Administered 2015-04-01 (×3): 1 mg via INTRAVENOUS
  Filled 2015-04-01 (×3): qty 1

## 2015-04-01 MED ORDER — LIDOCAINE HCL (PF) 1 % IJ SOLN
INTRAMUSCULAR | Status: DC | PRN
  Administered 2015-04-01 (×2): 5 mL

## 2015-04-01 NOTE — Progress Notes (Signed)
Patient ID: Stacey Reid, female   DOB: 03-13-1996, 19 y.o.   MRN: [id number] Her contractions are irregular and she is on low-dose Pitocin her cervix is a fingertip 80% vertex -2-3 amniotomy was performed and the fluid is clear she is on low-dose Pitocin

## 2015-04-01 NOTE — Anesthesia Procedure Notes (Signed)
Epidural Patient location during procedure: OB Start time: 04/01/2015 7:49 PM End time: 04/01/2015 8:10 PM  Staffing Anesthesiologist: Sebastian AcheMANNY, Stock[place] Nunley Performed by: anesthesiologist   Preanesthetic Checklist Completed: patient identified, site marked, surgical consent, pre-op evaluation, timeout performed, IV checked, risks and benefits discussed and monitors and equipment checked  Epidural Patient position: sitting Prep: DuraPrep Patient monitoring: heart rate, cardiac monitor, continuous pulse ox and blood pressure Approach: midline Location: L2-L3 Injection technique: LOR air  Needle:  Needle type: Tuohy  Needle gauge: 17 G Needle insertion depth: 4 cm Catheter type: closed end flexible Catheter at skin depth: 10 cm Test dose: negative

## 2015-04-01 NOTE — Anesthesia Preprocedure Evaluation (Addendum)
Anesthesia Evaluation  Patient identified by MRN, date of birth, ID band Patient awake and Patient confused    Reviewed: Allergy & Precautions, H&P , NPO status , Patient's Chart, lab work & pertinent test results  Airway Mallampati: II       Dental  (+) Teeth Intact   Pulmonary asthma (inhaler) ,  breath sounds clear to auscultation  Pulmonary exam normal       Cardiovascular Exercise Tolerance: Good hypertension, Rhythm:regular Rate:Normal  PIH MAG   Neuro/Psych    GI/Hepatic   Endo/Other    Renal/GU      Musculoskeletal   Abdominal Normal abdominal exam  (+)   Peds  Hematology   Anesthesia Other Findings   Reproductive/Obstetrics (+) Pregnancy PIH MAG                             Anesthesia Physical Anesthesia Plan  ASA: II  Anesthesia Plan: Epidural   Post-op Pain Management:    Induction:   Airway Management Planned:   Additional Equipment:   Intra-op Plan:   Post-operative Plan:   Informed Consent: I have reviewed the patients History and Physical, chart, labs and discussed the procedure including the risks, benefits and alternatives for the proposed anesthesia with the patient or authorized representative who has indicated his/her understanding and acceptance.     Plan Discussed with:   Anesthesia Plan Comments:         Anesthesia Quick Evaluation

## 2015-04-01 NOTE — H&P (Signed)
Stacey BrasilLavenia M Reid is a 19 y.o. female presenting for elevated BP. Maternal Medical History:  Fetal activity: Perceived fetal activity is normal.   Last perceived fetal movement was within the past hour.    Prenatal complications: PIH.   Prenatal Complications - Diabetes: none.    OB History    Gravida Para Term Preterm AB TAB SAB Ectopic Multiple Living   1 0 0 0 0 0 0 0 0 0      Past Medical History  Diagnosis Date  . Asthma    Past Surgical History  Procedure Laterality Date  . No past surgeries     Family History: family history includes Asthma in her mother; Hypertension in her mother. Social History:  reports that she has never smoked. She does not have any smokeless tobacco history on file. She reports that she does not drink alcohol or use illicit drugs.   Prenatal Transfer Tool  Maternal Diabetes: No Genetic Screening: Normal Maternal Ultrasounds/Referrals: Normal Fetal Ultrasounds or other Referrals:  None Maternal Substance Abuse:  No Significant Maternal Medications:  None Significant Maternal Lab Results:  None Other Comments:  None  Review of Systems  All other systems reviewed and are negative.   Dilation: Fingertip Effacement (%): 70 Station: -3 Exam by:: Delta Air LinesDenise Collison RN Blood pressure 131/77, pulse 77, temperature 98.8 F (37.1 C), temperature source Axillary, resp. rate 18, height 5\' 2"  (1.575 m), weight 171 lb (77.565 kg), last menstrual period 07/02/2014, SpO2 100 %. Maternal Exam:  Abdomen: Patient reports no abdominal tenderness. Fetal presentation: vertex  Introitus: Normal vulva. Normal vagina.    Physical Exam  Constitutional: She is oriented to person, place, and time. She appears well-developed and well-nourished.  HENT:  Head: Normocephalic and atraumatic.  Eyes: Conjunctivae are normal. Pupils are equal, round, and reactive to light.  Neck: Normal range of motion. Neck supple.  Cardiovascular: Normal rate and regular rhythm.    Respiratory: Effort normal and breath sounds normal.  GI: Soft.  Genitourinary: Vagina normal and uterus normal.  Musculoskeletal: Normal range of motion.  Neurological: She is alert and oriented to person, place, and time.  Skin: Skin is warm and dry.  Psychiatric: She has a normal mood and affect. Her behavior is normal. Judgment and thought content normal.    Prenatal labs: ABO, Rh: --/--/O POS (04/07 2145) Antibody: NEG (04/07 2145) Rubella: Nonimmune (10/12 0000) RPR: Nonreactive (10/12 0000)  HBsAg: Negative (10/12 0000)  HIV: Non-reactive (10/12 0000)  GBS: Negative (04/08 0000)   Assessment/Plan: 39 weeks.  PIH.  2 stage IOL.   Stacey Reid A 04/01/2015, 2:13 AM

## 2015-04-01 NOTE — Progress Notes (Signed)
Patient ID: Stacey Reid, female   DOB: 31-Jan-1996, 19 y.o.   MRN: [id number] Blood pressure 125/86 respirations 20 pulse 79 She received Cytotec and she is now contracting every 3 minutes mild so we'll start her on some Pitocin this a.m. she is still not dilated and she is on magnesium sulfate

## 2015-04-02 ENCOUNTER — Encounter (HOSPITAL_COMMUNITY): Payer: Self-pay | Admitting: *Deleted

## 2015-04-02 LAB — CBC
HEMATOCRIT: 29.7 % — AB (ref 36.0–46.0)
Hemoglobin: 10.3 g/dL — ABNORMAL LOW (ref 12.0–15.0)
MCH: 30.8 pg (ref 26.0–34.0)
MCHC: 34.7 g/dL (ref 30.0–36.0)
MCV: 88.9 fL (ref 78.0–100.0)
Platelets: 266 10*3/uL (ref 150–400)
RBC: 3.34 MIL/uL — ABNORMAL LOW (ref 3.87–5.11)
RDW: 12.4 % (ref 11.5–15.5)
WBC: 21.4 10*3/uL — AB (ref 4.0–10.5)

## 2015-04-02 LAB — MRSA PCR SCREENING: MRSA by PCR: NEGATIVE

## 2015-04-02 MED ORDER — ACETAMINOPHEN 160 MG/5ML PO SOLN
325.0000 mg | ORAL | Status: DC | PRN
  Filled 2015-04-02: qty 10.2

## 2015-04-02 MED ORDER — WITCH HAZEL-GLYCERIN EX PADS
1.0000 "application " | MEDICATED_PAD | CUTANEOUS | Status: DC | PRN
  Administered 2015-04-04: 1 via TOPICAL

## 2015-04-02 MED ORDER — TETANUS-DIPHTH-ACELL PERTUSSIS 5-2.5-18.5 LF-MCG/0.5 IM SUSP
0.5000 mL | Freq: Once | INTRAMUSCULAR | Status: DC
  Filled 2015-04-02: qty 0.5

## 2015-04-02 MED ORDER — MAGNESIUM SULFATE 40 G IN LACTATED RINGERS - SIMPLE: 2.0000 g/h | INTRAVENOUS | Status: DC

## 2015-04-02 MED ORDER — BENZOCAINE-MENTHOL 20-0.5 % EX AERO
1.0000 "application " | INHALATION_SPRAY | CUTANEOUS | Status: DC | PRN
  Administered 2015-04-04: 1 via TOPICAL
  Filled 2015-04-02 (×2): qty 56

## 2015-04-02 MED ORDER — IBUPROFEN 600 MG PO TABS
600.0000 mg | ORAL_TABLET | Freq: Four times a day (QID) | ORAL | Status: DC
  Filled 2015-04-02: qty 1

## 2015-04-02 MED ORDER — SENNOSIDES-DOCUSATE SODIUM 8.6-50 MG PO TABS
2.0000 | ORAL_TABLET | ORAL | Status: DC
  Administered 2015-04-03: 2 via ORAL
  Filled 2015-04-02 (×3): qty 2

## 2015-04-02 MED ORDER — PRENATAL MULTIVITAMIN CH
1.0000 | ORAL_TABLET | Freq: Every day | ORAL | Status: DC
Start: 2015-04-02 — End: 2015-04-04
  Administered 2015-04-02 – 2015-04-03 (×2): 1 via ORAL
  Filled 2015-04-02 (×2): qty 1

## 2015-04-02 MED ORDER — OXYCODONE HCL 5 MG/5ML PO SOLN: 5.0000 mg | ORAL | Status: DC | PRN

## 2015-04-02 MED ORDER — ACETAMINOPHEN 160 MG/5ML PO SOLN
650.0000 mg | ORAL | Status: DC | PRN
  Filled 2015-04-02: qty 20.3

## 2015-04-02 MED ORDER — LANOLIN HYDROUS EX OINT
TOPICAL_OINTMENT | CUTANEOUS | Status: DC | PRN
Start: 2015-04-02 — End: 2015-04-04

## 2015-04-02 MED ORDER — LACTATED RINGERS IV SOLN
INTRAVENOUS | Status: DC
  Administered 2015-04-02: 12:00:00 via INTRAVENOUS

## 2015-04-02 MED ORDER — OXYCODONE-ACETAMINOPHEN 5-325 MG PO TABS: 1.0000 | ORAL_TABLET | ORAL | Status: DC | PRN

## 2015-04-02 MED ORDER — OXYCODONE-ACETAMINOPHEN 5-325 MG PO TABS: 2.0000 | ORAL_TABLET | ORAL | Status: DC | PRN

## 2015-04-02 MED ORDER — DIBUCAINE 1 % RE OINT
1.0000 "application " | TOPICAL_OINTMENT | RECTAL | Status: DC | PRN
  Administered 2015-04-03 – 2015-04-04 (×2): 1 via RECTAL
  Filled 2015-04-02 (×2): qty 28

## 2015-04-02 MED ORDER — IBUPROFEN 100 MG/5ML PO SUSP
600.0000 mg | Freq: Four times a day (QID) | ORAL | Status: DC
  Administered 2015-04-02 – 2015-04-04 (×8): 600 mg via ORAL
  Filled 2015-04-02 (×10): qty 30

## 2015-04-02 MED ORDER — SIMETHICONE 80 MG PO CHEW: 80.0000 mg | CHEWABLE_TABLET | ORAL | Status: DC | PRN

## 2015-04-02 MED ORDER — DIPHENHYDRAMINE HCL 25 MG PO CAPS: 25.0000 mg | ORAL_CAPSULE | Freq: Four times a day (QID) | ORAL | Status: DC | PRN

## 2015-04-02 MED ORDER — ZOLPIDEM TARTRATE 5 MG PO TABS: 5.0000 mg | ORAL_TABLET | Freq: Every evening | ORAL | Status: DC | PRN

## 2015-04-02 MED ORDER — MEASLES, MUMPS & RUBELLA VAC ~~LOC~~ INJ
0.5000 mL | INJECTION | Freq: Once | SUBCUTANEOUS | Status: DC
  Filled 2015-04-02: qty 0.5

## 2015-04-02 MED ORDER — FERROUS SULFATE 300 (60 FE) MG/5ML PO SYRP
300.0000 mg | ORAL_SOLUTION | Freq: Two times a day (BID) | ORAL | Status: DC
  Administered 2015-04-02 – 2015-04-04 (×5): 300 mg via ORAL
  Filled 2015-04-02 (×6): qty 5

## 2015-04-02 MED ORDER — LABETALOL HCL 200 MG PO TABS
200.0000 mg | ORAL_TABLET | Freq: Two times a day (BID) | ORAL | Status: DC
  Administered 2015-04-02: 200 mg via ORAL
  Filled 2015-04-02 (×3): qty 1

## 2015-04-02 MED ORDER — ONDANSETRON HCL 4 MG PO TABS: 4.0000 mg | ORAL_TABLET | ORAL | Status: DC | PRN

## 2015-04-02 MED ORDER — ACETAMINOPHEN 325 MG PO TABS: 650.0000 mg | ORAL_TABLET | ORAL | Status: DC | PRN

## 2015-04-02 MED ORDER — OXYCODONE HCL 5 MG/5ML PO SOLN: 10.0000 mg | ORAL | Status: DC | PRN

## 2015-04-02 MED ORDER — FERROUS SULFATE 325 (65 FE) MG PO TABS
325.0000 mg | ORAL_TABLET | Freq: Two times a day (BID) | ORAL | Status: DC
  Filled 2015-04-02: qty 1

## 2015-04-02 MED ORDER — ONDANSETRON HCL 4 MG/2ML IJ SOLN: 4.0000 mg | INTRAMUSCULAR | Status: DC | PRN

## 2015-04-02 NOTE — Anesthesia Postprocedure Evaluation (Signed)
  Anesthesia Post-op Note  Patient: Stacey Reid  Procedure(s) Performed: * No procedures listed *  Patient Location: A-ICU  Anesthesia Type:Epidural  Level of Consciousness: awake and alert   Airway and Oxygen Therapy: Patient Spontanous Breathing  Post-op Pain: mild  Post-op Assessment: Post-op Vital signs reviewed, Patient's Cardiovascular Status Stable, Respiratory Function Stable, No signs of Nausea or vomiting, Pain level controlled, No headache, No residual numbness and No residual motor weakness  Post-op Vital Signs: Reviewed  Last Vitals:  Filed Vitals:   04/02/15 0805  BP:   Pulse: 102  Temp:   Resp:     Complications: No apparent anesthesia complications

## 2015-04-02 NOTE — Lactation Note (Signed)
This note was copied from the chart of Stacey Reid. Lactation Consultation Note: Initial visit with mom in AICU. Baby alert and fussy. Assisted mom with latch in football hold. Baby latched well and nursing well when I left room. Reviewed basic teaching with mom. BF brochure given with resources for support after DC. Encouraged to watch for feeding cues and feed whenever she sees them,No questions at present. To call for assist prn Patient Name: Boy Edd ArbourLavenia Salvucci ZOXWR'UToday's Date: 04/02/2015 Reason for consult: Initial assessment   Maternal Data Formula Feeding for Exclusion: Yes Reason for exclusion: Mother's choice to formula and breast feed on admission;Admission to Intensive Care Unit (ICU) post-partum Has patient been taught Hand Expression?: Yes Does the patient have breastfeeding experience prior to this delivery?: No  Feeding Feeding Type: Breast Fed Length of feed: 6 min  LATCH Score/Interventions Latch: Grasps breast easily, tongue down, lips flanged, rhythmical sucking.  Audible Swallowing: A few with stimulation  Type of Nipple: Everted at rest and after stimulation Intervention(s): Double electric pump  Comfort (Breast/Nipple): Soft / non-tender     Hold (Positioning): Assistance needed to correctly position infant at breast and maintain latch. Intervention(s): Breastfeeding basics reviewed  LATCH Score: 8  Lactation Tools Discussed/Used WIC Program: Yes   Consult Status Consult Status: Follow-up Date: 04/03/15 Follow-up type: In-patient    Pamelia HoitWeeks, Kari Montero D 04/02/2015, 9:33 AM

## 2015-04-02 NOTE — Progress Notes (Signed)
Patient ID: Stacey BrasilLavenia M Danner, female   DOB: July 21, 1996, 21 y.o.   MRN: [id number] Postpartum day 0 Eyes blood pressure 157/97 now 110/68 pulse 95 respirations 20 She has no complaints and she still on her magnesium sulfate fundus is firm Lochia moderate Legs negative We will discontinue magnesium after 24 hours   her blood pressures normal

## 2015-04-02 NOTE — Progress Notes (Signed)
   04/02/15 0844  Vitals  BP 115/62 mmHg  MAP (mmHg) 74  Pulse Rate 94  Oxygen Therapy  SpO2 98 %  O2 Device Room Air  pt reports feeling "dizzy" after getting up to t;he bathroom. Lochia WDL's

## 2015-04-03 ENCOUNTER — Encounter (HOSPITAL_COMMUNITY): Payer: Self-pay | Admitting: *Deleted

## 2015-04-03 MED ORDER — MEASLES, MUMPS & RUBELLA VAC ~~LOC~~ INJ
0.5000 mL | INJECTION | Freq: Once | SUBCUTANEOUS | Status: AC
  Administered 2015-04-04: 0.5 mL via SUBCUTANEOUS
  Filled 2015-04-03: qty 0.5

## 2015-04-03 NOTE — Progress Notes (Signed)
Patient ID: Stacey BrasilLavenia M Hush, female   DOB: [birth year], 19 y.o.   MRN: [id number] Postpartum day one Vital signs normal Fundus firm Lochia moderate Blood pressures doing fine she is off of her magnesium and off of the labetalol so she'll be observed today

## 2015-04-03 NOTE — Lactation Note (Signed)
This note was copied from the chart of Boy Nikole Fedora. Lactation Consultation Note  Patient Name: Boy Edd ArbourLavenia Morro ZOXWR'UToday's Date: 04/03/2015 Reason for consult: Follow-up assessment of this mom and baby at 39 hours pp.  LC spoke with this mom who is both breastfeeding and formula/bottle-feeding by choice.  Her RN had explained LEAD cautions earlier and LC reviewed cautions.  Mom says she had hoped to breastfeed for one year, so LC emphasized supply and demand and ideal of avoiding supplement and exclusive breastfeeding for a minimum of first 2 weeks.  Mom confirms that baby is latching well and recent LATCH score=9 per RN assessment.   Maternal Data Reason for exclusion: Mother's choice to formula and breast feed on admission  Feeding Feeding Type: Breast Fed Length of feed: 20 min  LATCH Score/Interventions            Most recent LATCH score=9 per RN assessment; mom denies any latching difficulty now and baby is nursing 15-20 minutes per feeding          Lactation Tools Discussed/Used   Supply and demand for milk production LEAD cautions  Consult Status Consult Status: Follow-up Date: 04/04/15 Follow-up type: In-patient    Warrick ParisianBryant, Latrisa Hellums [facility] 04/03/2015, 5:14 PM

## 2015-04-04 ENCOUNTER — Ambulatory Visit: Payer: Self-pay

## 2015-04-04 NOTE — Lactation Note (Signed)
This note was copied from the chart of Stacey Reid. Lactation Consultation Note; Mother's breast are full. Staff nurse advised in massage and using ice . Assist mother with latching infant. Infant sustained latch for 10 mins on the rt breast. Assist with latching on alternate breast. Infant sustained latch for 20 mins. Infant observed with good milk transfer. Observed mother's breast soften. Lots of teaching with mother from baby and me book. Mother advised to breastfeeding infant 8-12 times and with feeding cues. [facility].   Patient Name: Boy Edd ArbourLavenia Warshawsky ONGEX'BToday's Date: 04/04/2015     Maternal Data    Feeding    LATCH Score/Interventions                      Lactation Tools Discussed/Used     Consult Status      Michel BickersKendrick, Lalana Wachter McCoy 04/04/2015, 3:29 PM

## 2015-04-04 NOTE — Discharge Instructions (Signed)
Discharge instructions   You can wash your hair  Shower  Eat what you want  Drink what you want  See me in 6 weeks  Your ankles are going to swell more in the next 2 weeks than when pregnant  No sex for 6 weeks   Helena Sardo A, MD 04/04/2015

## 2015-04-04 NOTE — Progress Notes (Signed)
Ur chart review completed.  

## 2015-04-04 NOTE — Discharge Summary (Signed)
Obstetric Discharge Summary Reason for Admission: induction of labor Prenatal Procedures: Preeclampsia Intrapartum Procedures: spontaneous vaginal delivery Postpartum Procedures: none Complications-Operative and Postpartum: none HEMOGLOBIN  Date Value Ref Range Status  04/02/2015 10.3* 12.0 - 15.0 g/dL Final   HCT  Date Value Ref Range Status  04/02/2015 29.7* 36.0 - 46.0 % Final    Physical Exam:  General: alert Lochia: appropriate Uterine Fundus: soft Incision: healing well DVT Evaluation: No evidence of DVT seen on physical exam.  Discharge Diagnoses: Term Pregnancy-delivered  Discharge Information: Date: 04/04/2015 Activity: pelvic rest Diet: routine Medications: Percocet Condition: stable Instructions: refer to practice specific booklet Discharge to: home Follow-up Information    Follow up with MARSHALL,BERNARD A, MD In 6 weeks.   Specialty:  Obstetrics and Gynecology   Contact information:   [address] [place] [address] [phone]       Newborn Data: Live born female  Birth Weight: 5 lb 13.5 oz (2651 g) APGAR: 6, 8  Home with mother.  MARSHALL,BERNARD A 04/04/2015, 6:45 AM

## 2015-04-04 NOTE — Progress Notes (Signed)
Patient ID: Stacey Reid, female   DOB: [birth year], 19 y.o.   MRN: [id number] Postpartum day 2 Blood pressure low 116/66 respiration 18 pulse 75 Vital signs normal Fundus firm Lochia moderate Legs negative home today

## 2015-08-12 ENCOUNTER — Encounter (HOSPITAL_COMMUNITY): Payer: Self-pay | Admitting: *Deleted

## 2015-08-12 ENCOUNTER — Inpatient Hospital Stay (HOSPITAL_COMMUNITY)
Admission: AD | Admit: 2015-08-12 | Discharge: 2015-08-12 | Disposition: A | Discharge status: Home / Self Care | Payer: Medicaid Other | Source: Ambulatory Visit | Attending: Obstetrics | Admitting: Obstetrics

## 2015-08-12 DIAGNOSIS — N390 Urinary tract infection, site not specified: Secondary | ICD-10-CM

## 2015-08-12 DIAGNOSIS — M549 Dorsalgia, unspecified: Secondary | ICD-10-CM | POA: Diagnosis present

## 2015-08-12 DIAGNOSIS — N898 Other specified noninflammatory disorders of vagina: Secondary | ICD-10-CM | POA: Diagnosis present

## 2015-08-12 LAB — CBC WITH DIFFERENTIAL/PLATELET
BASOS ABS: 0 10*3/uL (ref 0.0–0.1)
Basophils Relative: 0 % (ref 0–1)
EOS PCT: 1 % (ref 0–5)
Eosinophils Absolute: 0 10*3/uL (ref 0.0–0.7)
HCT: 37.1 % (ref 36.0–46.0)
Hemoglobin: 12.8 g/dL (ref 12.0–15.0)
Lymphocytes Relative: 36 % (ref 12–46)
Lymphs Abs: 2 10*3/uL (ref 0.7–4.0)
MCH: 29.9 pg (ref 26.0–34.0)
MCHC: 34.5 g/dL (ref 30.0–36.0)
MCV: 86.7 fL (ref 78.0–100.0)
MONO ABS: 0.6 10*3/uL (ref 0.1–1.0)
Monocytes Relative: 11 % (ref 3–12)
Neutro Abs: 3 10*3/uL (ref 1.7–7.7)
Neutrophils Relative %: 52 % (ref 43–77)
PLATELETS: 355 10*3/uL (ref 150–400)
RBC: 4.28 MIL/uL (ref 3.87–5.11)
RDW: 12.1 % (ref 11.5–15.5)
WBC: 5.7 10*3/uL (ref 4.0–10.5)

## 2015-08-12 LAB — URINALYSIS, ROUTINE W REFLEX MICROSCOPIC
BILIRUBIN URINE: NEGATIVE
Glucose, UA: NEGATIVE mg/dL
Ketones, ur: NEGATIVE mg/dL
Leukocytes, UA: NEGATIVE
NITRITE: POSITIVE — AB
PH: 5.5 (ref 5.0–8.0)
PROTEIN: NEGATIVE mg/dL
Specific Gravity, Urine: 1.025 (ref 1.005–1.030)
Urobilinogen, UA: 0.2 mg/dL (ref 0.0–1.0)

## 2015-08-12 LAB — WET PREP, GENITAL
Trich, Wet Prep: NONE SEEN
Yeast Wet Prep HPF POC: NONE SEEN

## 2015-08-12 LAB — POCT PREGNANCY, URINE: Preg Test, Ur: NEGATIVE

## 2015-08-12 LAB — URINE MICROSCOPIC-ADD ON

## 2015-08-12 MED ORDER — CIPROFLOXACIN 500 MG/5ML (10%) PO SUSR: 500.0000 mg | Freq: Two times a day (BID) | ORAL | Status: DC

## 2015-08-12 NOTE — MAU Provider Note (Signed)
History     CSN: [id number]  Arrival date and time: 08/12/15 1022   First Provider Initiated Contact with Patient 08/12/15 1101      Chief Complaint  Patient presents with  . Back Pain   HPI  Stacey Reid is a 19 y.o. female who presents for vaginal discharge and back pain.  -Vaginal discharge x 3 weeks. White & thick, at times clumpy. States was initially malodorous so she used over the counter ph balance. Denies vaginal irritation.  -Right flank/mid back pain x 2 days. Reports urinary urgency but denies dysuria. Denies hematuria or fever. Pain is worse with walking. Describes as dull/achy. Has not taken pain meds. States yesterday, the painful area felts tight and felt like a muscle knot.     Past Medical History  Diagnosis Date  . Asthma     years ago    Past Surgical History  Procedure Laterality Date  . No past surgeries      Family History  Problem Relation Age of Onset  . Asthma Mother   . Hypertension Mother     Social History  Substance Use Topics  . Smoking status: Never Smoker   . Smokeless tobacco: Never Used  . Alcohol Use: No    Allergies: No Known Allergies  No prescriptions prior to admission    Review of Systems  Constitutional: Negative.  Negative for fever.  Gastrointestinal: Negative for nausea and vomiting.  Genitourinary: Positive for urgency and flank pain.       Positive - moderate amount of vaginal discharge.  Positive - vaginal bleeding; started period yesterday  Musculoskeletal: Positive for back pain. Negative for falls.   Physical Exam   Blood pressure 119/78, pulse 70, temperature 98.2 F (36.8 C), temperature source Oral, resp. rate 16, height 5\' 3"  (1.6 m), weight 144 lb 8 oz (65.545 kg), last menstrual period 08/12/2015, not currently breastfeeding.  Physical Exam  Constitutional: She appears well-developed and well-nourished. No distress.  HENT:  Head: Normocephalic and atraumatic.  Cardiovascular: Normal  rate, regular rhythm and normal heart sounds.   Respiratory: Effort normal and breath sounds normal. No respiratory distress. She has no wheezes.  GI: Soft. Bowel sounds are normal. She exhibits no distension. There is no tenderness. There is no CVA tenderness.  Genitourinary: Vagina normal and uterus normal. Cervix exhibits no motion tenderness and no friability. Right adnexum displays no mass, no tenderness and no fullness. Left adnexum displays no mass, no tenderness and no fullness.  Small amount of dark red blood; appropriate for menstruation. Minimal amount of white discharge adherent to vaginal wall.   Skin: Skin is warm and dry. She is not diaphoretic. No erythema.  Psychiatric: She has a normal mood and affect. Her behavior is normal. Judgment and thought content normal.    MAU Course  Procedures Results for orders placed or performed during the hospital encounter of 08/12/15 (from the past 24 hour(s))  Urinalysis, Routine w reflex microscopic (not at [facility])     Status: Abnormal   Collection Time: 08/12/15 10:37 AM  Result Value Ref Range   Color, Urine YELLOW YELLOW   APPearance HAZY (A) CLEAR   Specific Gravity, Urine 1.025 1.005 - 1.030   pH 5.5 5.0 - 8.0   Glucose, UA NEGATIVE NEGATIVE mg/dL   Hgb urine dipstick LARGE (A) NEGATIVE   Bilirubin Urine NEGATIVE NEGATIVE   Ketones, ur NEGATIVE NEGATIVE mg/dL   Protein, ur NEGATIVE NEGATIVE mg/dL   Urobilinogen, UA 0.2 0.0 - 1.0  mg/dL   Nitrite POSITIVE (A) NEGATIVE   Leukocytes, UA NEGATIVE NEGATIVE  Urine microscopic-add on     Status: Abnormal   Collection Time: 08/12/15 10:37 AM  Result Value Ref Range   Squamous Epithelial / LPF FEW (A) RARE   WBC, UA 0-2 <3 WBC/hpf   Bacteria, UA RARE RARE  Pregnancy, urine POC     Status: None   Collection Time: 08/12/15 11:39 AM  Result Value Ref Range   Preg Test, Ur NEGATIVE NEGATIVE  Wet prep, genital     Status: Abnormal   Collection Time: 08/12/15 11:43 AM  Result Value Ref  Range   Yeast Wet Prep HPF POC NONE SEEN NONE SEEN   Trich, Wet Prep NONE SEEN NONE SEEN   Clue Cells Wet Prep HPF POC FEW (A) NONE SEEN   WBC, Wet Prep HPF POC FEW (A) NONE SEEN  CBC with Differential     Status: None   Collection Time: 08/12/15 12:30 PM  Result Value Ref Range   WBC 5.7 4.0 - 10.5 K/uL   RBC 4.28 3.87 - 5.11 MIL/uL   Hemoglobin 12.8 12.0 - 15.0 g/dL   HCT 08.6 57.8 - 46.9 %   MCV 86.7 78.0 - 100.0 fL   MCH 29.9 26.0 - 34.0 pg   MCHC 34.5 30.0 - 36.0 g/dL   RDW 62.9 52.8 - 41.3 %   Platelets 355 150 - 400 K/uL   Neutrophils Relative % 52 43 - 77 %   Neutro Abs 3.0 1.7 - 7.7 K/uL   Lymphocytes Relative 36 12 - 46 %   Lymphs Abs 2.0 0.7 - 4.0 K/uL   Monocytes Relative 11 3 - 12 %   Monocytes Absolute 0.6 0.1 - 1.0 K/uL   Eosinophils Relative 1 0 - 5 %   Eosinophils Absolute 0.0 0.0 - 0.7 K/uL   Basophils Relative 0 0 - 1 %   Basophils Absolute 0.0 0.0 - 0.1 K/uL    MDM CBC wnl UPT negative GC/CT & wet prep collected UTI per urinalysis  Assessment and Plan  A: 1. Urinary tract infection without hematuria, site unspecified    P: Discharge home in stable condition.  Rx cipro suspension - pt states cannot take pills, requested medication in liquid form If symptoms worsen go to ED  Judeth Horn, NP  08/12/2015, 11:01 AM

## 2015-08-12 NOTE — MAU Note (Signed)
Been having vaginal discharge for 3 weeks, no itching, white, more than usual. Now having lower right-sided back pain. Urgency to void. Pt is now on on menstrual cycle, day 1. Rates back pain 5of10. Denies fever but breaks out in sweats.

## 2015-08-12 NOTE — Discharge Instructions (Signed)

## 2015-08-13 ENCOUNTER — Telehealth (HOSPITAL_COMMUNITY): Payer: Self-pay | Admitting: *Deleted

## 2015-08-14 ENCOUNTER — Other Ambulatory Visit: Payer: Self-pay | Admitting: Advanced Practice Midwife

## 2015-08-14 MED ORDER — CIPROFLOXACIN HCL 500 MG PO TABS: 500.0000 mg | ORAL_TABLET | Freq: Two times a day (BID) | ORAL | Status: AC

## 2015-08-14 NOTE — Progress Notes (Signed)
Received fax from [facility] pharmacy on [address] in [place] indicating that pt insurance does not cover Cipro suspension Rx from Judeth Horn, NP, on 08/12/15.  Pt desires tablets instead.  Rx changed to Cipro 500 mg tabs BID x 7 days with no refills.  Called to pt pharmacy on 08/14/15.

## 2015-08-15 LAB — GC/CHLAMYDIA PROBE AMP (~~LOC~~) NOT AT ARMC
CHLAMYDIA, DNA PROBE: NEGATIVE
NEISSERIA GONORRHEA: NEGATIVE

## 2015-10-29 ENCOUNTER — Encounter (HOSPITAL_COMMUNITY): Payer: Self-pay | Admitting: *Deleted

## 2015-10-29 ENCOUNTER — Emergency Department (HOSPITAL_COMMUNITY)
Admission: EM | Admit: 2015-10-29 | Discharge: 2015-10-29 | Disposition: A | Discharge status: Home / Self Care | Payer: No Typology Code available for payment source | Attending: Emergency Medicine | Admitting: Emergency Medicine

## 2015-10-29 DIAGNOSIS — S3992XA Unspecified injury of lower back, initial encounter: Secondary | ICD-10-CM | POA: Diagnosis not present

## 2015-10-29 DIAGNOSIS — M62838 Other muscle spasm: Secondary | ICD-10-CM

## 2015-10-29 DIAGNOSIS — Y92410 Unspecified street and highway as the place of occurrence of the external cause: Secondary | ICD-10-CM | POA: Diagnosis not present

## 2015-10-29 DIAGNOSIS — Y939 Activity, unspecified: Secondary | ICD-10-CM | POA: Diagnosis not present

## 2015-10-29 DIAGNOSIS — S199XXA Unspecified injury of neck, initial encounter: Secondary | ICD-10-CM | POA: Diagnosis not present

## 2015-10-29 DIAGNOSIS — J45909 Unspecified asthma, uncomplicated: Secondary | ICD-10-CM | POA: Diagnosis not present

## 2015-10-29 DIAGNOSIS — Y999 Unspecified external cause status: Secondary | ICD-10-CM | POA: Insufficient documentation

## 2015-10-29 DIAGNOSIS — M545 Low back pain, unspecified: Secondary | ICD-10-CM

## 2015-10-29 DIAGNOSIS — M62830 Muscle spasm of back: Secondary | ICD-10-CM | POA: Diagnosis not present

## 2015-10-29 DIAGNOSIS — M542 Cervicalgia: Secondary | ICD-10-CM

## 2015-10-29 MED ORDER — NAPROXEN 500 MG PO TABS: 500.0000 mg | ORAL_TABLET | Freq: Two times a day (BID) | ORAL | Status: DC | PRN

## 2015-10-29 MED ORDER — CYCLOBENZAPRINE HCL 10 MG PO TABS: 10.0000 mg | ORAL_TABLET | Freq: Three times a day (TID) | ORAL | Status: DC | PRN
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
## 2016-01-16 ENCOUNTER — Emergency Department (INDEPENDENT_AMBULATORY_CARE_PROVIDER_SITE_OTHER)
Admission: EM | Admit: 2016-01-16 | Discharge: 2016-01-16 | Disposition: A | Discharge status: Home / Self Care | Payer: Medicaid Other | Source: Home / Self Care | Attending: Emergency Medicine | Admitting: Emergency Medicine

## 2016-01-16 ENCOUNTER — Other Ambulatory Visit (HOSPITAL_COMMUNITY)
Admission: RE | Admit: 2016-01-16 | Discharge: 2016-01-16 | Disposition: A | Discharge status: Home / Self Care | Payer: Medicaid Other | Source: Ambulatory Visit | Attending: Emergency Medicine | Admitting: Emergency Medicine

## 2016-01-16 ENCOUNTER — Encounter (HOSPITAL_COMMUNITY): Payer: Self-pay | Admitting: *Deleted

## 2016-01-16 ENCOUNTER — Emergency Department (HOSPITAL_COMMUNITY)
Admission: EM | Admit: 2016-01-16 | Discharge: 2016-01-16 | Disposition: A | Discharge status: Home / Self Care | Payer: Medicaid Other | Attending: Emergency Medicine | Admitting: Emergency Medicine

## 2016-01-16 ENCOUNTER — Encounter (HOSPITAL_COMMUNITY): Payer: Self-pay | Admitting: Emergency Medicine

## 2016-01-16 DIAGNOSIS — N760 Acute vaginitis: Secondary | ICD-10-CM | POA: Diagnosis present

## 2016-01-16 DIAGNOSIS — R2232 Localized swelling, mass and lump, left upper limb: Secondary | ICD-10-CM | POA: Diagnosis not present

## 2016-01-16 DIAGNOSIS — M79645 Pain in left finger(s): Secondary | ICD-10-CM

## 2016-01-16 DIAGNOSIS — Z113 Encounter for screening for infections with a predominantly sexual mode of transmission: Secondary | ICD-10-CM | POA: Insufficient documentation

## 2016-01-16 DIAGNOSIS — J45909 Unspecified asthma, uncomplicated: Secondary | ICD-10-CM | POA: Diagnosis not present

## 2016-01-16 LAB — POCT URINALYSIS DIP (DEVICE)
BILIRUBIN URINE: NEGATIVE
GLUCOSE, UA: NEGATIVE mg/dL
Hgb urine dipstick: NEGATIVE
KETONES UR: NEGATIVE mg/dL
LEUKOCYTES UA: NEGATIVE
Nitrite: NEGATIVE
PH: 6 (ref 5.0–8.0)
Protein, ur: NEGATIVE mg/dL
Specific Gravity, Urine: 1.025 (ref 1.005–1.030)
Urobilinogen, UA: 0.2 mg/dL (ref 0.0–1.0)

## 2016-01-16 LAB — POCT PREGNANCY, URINE: PREG TEST UR: NEGATIVE

## 2016-01-16 MED ORDER — CEPHALEXIN 500 MG PO CAPS: 500.0000 mg | ORAL_CAPSULE | Freq: Four times a day (QID) | ORAL | Status: DC

## 2016-01-16 MED ORDER — IBUPROFEN 100 MG/5ML PO SUSP: 600.0000 mg | Freq: Four times a day (QID) | ORAL | Status: DC | PRN

## 2016-01-16 MED ORDER — METRONIDAZOLE 500 MG PO TABS: 500.0000 mg | ORAL_TABLET | Freq: Two times a day (BID) | ORAL | Status: DC

## 2016-01-16 MED ORDER — IBUPROFEN 800 MG PO TABS: 800.0000 mg | ORAL_TABLET | Freq: Four times a day (QID) | ORAL | Status: DC | PRN

## 2016-01-16 NOTE — Discharge Instructions (Signed)

## 2016-01-16 NOTE — ED Provider Notes (Signed)
CSN: [id number]     Arrival date & time 01/16/16  1845 History  By signing my name below, I, Emmanuella Mensah, attest that this documentation has been prepared under the direction and in the presence of Shawne Bulow,PA-C. Electronically Signed: Angelene Giovanni, ED Scribe. 01/16/2016. 7:47 PM.    Chief Complaint  Patient presents with  . Finger Injury   The history is provided by the patient. No language interpreter was used.   HPI Comments: Stacey Reid is a 20 y.o. female who presents to the Emergency Department complaining of gradually worsening moderate left pinky pain with swelling onset yesterday. She states that the pain is worse with touch. No alleviating factors noted. She denies any recent falls, injuries, or trauma. She reports NKDA. She denies any fever, chills, SOB, numbness/tingling, or weakness.   Past Medical History  Diagnosis Date  . Asthma     years ago   Past Surgical History  Procedure Laterality Date  . No past surgeries     Family History  Problem Relation Age of Onset  . Asthma Mother   . Hypertension Mother    Social History  Substance Use Topics  . Smoking status: Never Smoker   . Smokeless tobacco: Never Used  . Alcohol Use: No   OB History    Gravida Para Term Preterm AB TAB SAB Ectopic Multiple Living   0 0 0 0 0 0 1     Review of Systems  Constitutional: Negative for fever and chills.  Respiratory: Negative for shortness of breath.   Musculoskeletal: Positive for joint swelling and arthralgias (left pinky).  Neurological: Negative for weakness and numbness.  All other systems reviewed and are negative.     Allergies  Review of patient's allergies indicates no known allergies.  Home Medications   Prior to Admission medications   Medication Sig Start Date End Date Taking? Authorizing Provider  cephALEXin (KEFLEX) 500 MG capsule Take 1 capsule (500 mg total) by mouth 4 (four) times daily. 01/16/16   Cheri Fowler, PA-C   cyclobenzaprine (FLEXERIL) 10 MG tablet Take 1 tablet (10 mg total) by mouth 3 (three) times daily as needed for muscle spasms. 10/29/15   Mercedes Camprubi-Soms, PA-C  ibuprofen (CHILDRENS IBUPROFEN) 100 MG/5ML suspension Take 30 mLs (600 mg total) by mouth every 6 (six) hours as needed. 01/16/16   Cheri Fowler, PA-C  metroNIDAZOLE (FLAGYL) 500 MG tablet Take 1 tablet (500 mg total) by mouth 2 (two) times daily. X 7 days 01/16/16   Domenick Gong, MD   BP 125/79 mmHg  Pulse 71  Temp(Src) 98.6 F (37 C) (Oral)  Resp 16  SpO2 100% Physical Exam  Constitutional: She is oriented to person, place, and time. She appears well-developed and well-nourished.  HENT:  Head: Atraumatic.  Eyes: Conjunctivae are normal. No scleral icterus.  Neck: No tracheal deviation present.  Cardiovascular:  Capillary refill less than 3 seconds.   Pulmonary/Chest: Effort normal. No respiratory distress.  Musculoskeletal: Normal range of motion. She exhibits tenderness.  Left 5th digit: TTP from DIPJ to distal fingerpad.  No obvious swelling, deformity, induration, or fluctuance.  Mild erythema.    Neurological: She is alert and oriented to person, place, and time.  Strength and sensation intact bilaterally in upper extremities.   Skin: Skin is warm and dry.  Psychiatric: She has a normal mood and affect. Her behavior is normal.    ED Course  Procedures (including critical care time) DIAGNOSTIC STUDIES: Oxygen Saturation is  100% on RA, normal by my interpretation.    COORDINATION OF CARE: 7:45 PM- Pt advised of plan for treatment and pt agrees. Pt will receive Keflex. Advised to use 800 mg Ibuprofen.   MDM   Final diagnoses:  Pain in finger of left hand    Patient presents with left little finger pain.  Possible infectious component given mild erythema.  No induration, fluctuance to suggest abscess.  No signs of paronychia.  Doubt felon.  NVI.  Good capillary refill.  Will give Keflex for possible  infection.  Ibuprofen for pain.  Discussed return precautions.  Patient agrees and acknowledges the above plan for discharge.  I personally performed the services described in this documentation, which was scribed in my presence. The recorded information has been reviewed and is accurate.   Cheri Fowler, PA-C 01/16/16 1953  Mancel Bale, MD 01/17/16 [phone]

## 2016-01-16 NOTE — ED Provider Notes (Signed)
HPI  SUBJECTIVE:  Stacey Reid is a 20 y.o. female who presents with  7 days nonoderous vaginal discharge,  No urgency, frequency, dysuria, oderous urine, hematuria,  genital blisters, vaginal itching. Symptoms slightly better with heating pad. No aggravating factors. She has not tried anything else for this. She reports intermittent, achy,  low midline abdominal pain occasional radiation to her back that lasts approximately 5 minutes and then resolves. There are no aggravating or alleviating factors for this. No fevers, V. No recent abx use. Pt sexually active with same female  partner who is asxati. Does intermittently use condoms. STD's not a concern today. No history of BV gonorrhea chlamydia, Trichomonas, yeast infection. No h/o syphilis, herpes, HIV. No history of PID, ectopic pregnancies. No h/o DM, hypertension. LMP 01/01/2016. Past medical history of asthma. PMD Dr. Marquis Buggy, OB/GYN, Dr. Gaynell Face. Concern today is vaginal discharge.   Past Medical History  Diagnosis Date  . Asthma     years ago    Past Surgical History  Procedure Laterality Date  . No past surgeries      Family History  Problem Relation Age of Onset  . Asthma Mother   . Hypertension Mother     Social History  Substance Use Topics  . Smoking status: Never Smoker   . Smokeless tobacco: Never Used  . Alcohol Use: No    No current facility-administered medications for this encounter.  Current outpatient prescriptions:  .  cyclobenzaprine (FLEXERIL) 10 MG tablet, Take 1 tablet (10 mg total) by mouth 3 (three) times daily as needed for muscle spasms., Disp: 15 tablet, Rfl: 0 .  ibuprofen (ADVIL,MOTRIN) 800 MG tablet, Take 1 tablet (800 mg total) by mouth every 6 (six) hours as needed., Disp: 30 tablet, Rfl: 0 .  metroNIDAZOLE (FLAGYL) 500 MG tablet, Take 1 tablet (500 mg total) by mouth 2 (two) times daily. X 7 days, Disp: 14 tablet, Rfl: 0  No Known Allergies   ROS  As noted in HPI.   Physical  Exam  BP 125/74 mmHg  Pulse 70  Temp(Src) 98.5 F (36.9 C) (Oral)  Resp 18  SpO2 100%  Constitutional: Well developed, well nourished, no acute distress Eyes:  EOMI, conjunctiva normal bilaterally HENT: Normocephalic, atraumatic,mucus membranes moist Respiratory: Normal inspiratory effort Cardiovascular: Normal rate GI: nondistended soft, nontender. No suprapubic tenderness no flank tenderness back: No CVA tenderness GU: External genitalia normal.  Normal vaginal mucosa.  Normal os. Thin nonoderous  White vaginal discharge.   Uterus smooth, NT. No CMT. No  adnexal tenderness. No adnexal masses.  Chaperone present during exam skin: No rash, skin intact Musculoskeletal: no deformities Neurologic: Alert & oriented x 3, no focal neuro deficits Psychiatric: Speech and behavior appropriate   ED Course   Medications - No data to display  Orders Placed This Encounter  Procedures  . POCT urinalysis dip (device)    Standing Status: Standing     Number of Occurrences: 1     Standing Expiration Date:   . Pregnancy, urine POC    Standing Status: Standing     Number of Occurrences: 1     Standing Expiration Date:     Results for orders placed or performed during the hospital encounter of 01/16/16 (from the past 24 hour(s))  POCT urinalysis dip (device)     Status: None   Collection Time: 01/16/16  4:05 PM  Result Value Ref Range   Glucose, UA NEGATIVE NEGATIVE mg/dL   Bilirubin Urine NEGATIVE NEGATIVE  Ketones, ur NEGATIVE NEGATIVE mg/dL   Specific Gravity, Urine 1.025 1.005 - 1.030   Hgb urine dipstick NEGATIVE NEGATIVE   pH 6.0 5.0 - 8.0   Protein, ur NEGATIVE NEGATIVE mg/dL   Urobilinogen, UA 0.2 0.0 - 1.0 mg/dL   Nitrite NEGATIVE NEGATIVE   Leukocytes, UA NEGATIVE NEGATIVE  Pregnancy, urine POC     Status: None   Collection Time: 01/16/16  4:13 PM  Result Value Ref Range   Preg Test, Ur NEGATIVE NEGATIVE   No results found.  ED Clinical  Impression  Vaginitis   ED Assessment/Plan  H&P most c/w  BV. May also be local inflammatory reaction related to condom use. She denies other foreign body insertion or spermicide use. UA negative for UTI. She is not pregnant. Sent off GC/chlamydia, wet prep. Will not treat empirically now as patient has no concerns for STD today. Will send home with flagyl. Advised pt to refrain from sexual contact until she  knows lab results, symptoms resolve, and partner(s) are treated if necessary. Follow-up with Dr. Gaynell Face, OB/GYN as needed. To the ER if gets worse. Pt provided working phone number. Pt agrees with plan.    *This clinic note was created using Dragon dictation software. Therefore, there may be occasional mistakes despite careful proofreading.  ?    Domenick Gong, MD 01/16/16 [phone]

## 2016-01-16 NOTE — ED Notes (Signed)
Patient with swelling and pain to left pinky finger, noted yesterday, no injury noted

## 2016-01-16 NOTE — Discharge Instructions (Signed)
Take the medication as written. Give us a working phone number so that we can contact you if needed. Refrain from sexual contact until you know your results and your partner(s) are treated. Return if you get worse, have a fever >100.4, or for any concerns.  ° °Go to www.goodrx.com to look up your medications. This will give you a list of where you can find your prescriptions at the most affordable prices.  °

## 2016-01-16 NOTE — ED Notes (Signed)
C/o vaginal discharge for a week now States she has pinky finger pain since yesterday

## 2016-01-17 LAB — CERVICOVAGINAL ANCILLARY ONLY
CHLAMYDIA, DNA PROBE: NEGATIVE
Neisseria Gonorrhea: NEGATIVE

## 2016-01-18 ENCOUNTER — Encounter (HOSPITAL_COMMUNITY): Payer: Self-pay | Admitting: *Deleted

## 2016-01-18 ENCOUNTER — Emergency Department (HOSPITAL_COMMUNITY)
Admission: EM | Admit: 2016-01-18 | Discharge: 2016-01-18 | Disposition: A | Discharge status: Home / Self Care | Payer: Medicaid Other | Attending: Emergency Medicine | Admitting: Emergency Medicine

## 2016-01-18 DIAGNOSIS — Z23 Encounter for immunization: Secondary | ICD-10-CM | POA: Insufficient documentation

## 2016-01-18 DIAGNOSIS — S6991XD Unspecified injury of right wrist, hand and finger(s), subsequent encounter: Secondary | ICD-10-CM | POA: Diagnosis present

## 2016-01-18 DIAGNOSIS — L03011 Cellulitis of right finger: Secondary | ICD-10-CM | POA: Diagnosis not present

## 2016-01-18 DIAGNOSIS — W231XXD Caught, crushed, jammed, or pinched between stationary objects, subsequent encounter: Secondary | ICD-10-CM | POA: Diagnosis not present

## 2016-01-18 DIAGNOSIS — Z792 Long term (current) use of antibiotics: Secondary | ICD-10-CM | POA: Diagnosis not present

## 2016-01-18 DIAGNOSIS — J45909 Unspecified asthma, uncomplicated: Secondary | ICD-10-CM | POA: Diagnosis not present

## 2016-01-18 LAB — CERVICOVAGINAL ANCILLARY ONLY: WET PREP (BD AFFIRM): POSITIVE — AB

## 2016-01-18 MED ORDER — LIDOCAINE HCL (PF) 1 % IJ SOLN
10.0000 mL | Freq: Once | INTRAMUSCULAR | Status: AC
  Administered 2016-01-18: 10 mL via INTRADERMAL
  Filled 2016-01-18: qty 10

## 2016-01-18 MED ORDER — TETANUS-DIPHTH-ACELL PERTUSSIS 5-2.5-18.5 LF-MCG/0.5 IM SUSP
0.5000 mL | Freq: Once | INTRAMUSCULAR | Status: AC
  Administered 2016-01-18: 0.5 mL via INTRAMUSCULAR
  Filled 2016-01-18: qty 0.5

## 2016-01-18 MED ORDER — TRAMADOL HCL 50 MG PO TABS: 50.0000 mg | ORAL_TABLET | Freq: Four times a day (QID) | ORAL | Status: DC | PRN

## 2016-01-18 NOTE — Discharge Instructions (Signed)
Fingertip Infection °When an infection is around the nail, it is called a paronychia. When it appears over the tip of the finger, it is called a felon. These infections are due to minor injuries or cracks in the skin. If they are not treated properly, they can lead to bone infection and permanent damage to the fingernail. °Incision and drainage is necessary if a pus pocket (an abscess) has formed. Antibiotics and pain medicine may also be needed. Keep your hand elevated for the next 2-3 days to reduce swelling and pain. If a pack was placed in the abscess, it should be removed in 1-2 days by your caregiver. Soak the finger in warm water for 20 minutes 4 times daily to help promote drainage. °Keep the hands as dry as possible. Wear protective gloves with cotton liners. See your caregiver for follow-up care as recommended.  °HOME CARE INSTRUCTIONS  °· Keep wound clean, dry and dressed as suggested by your caregiver. °· Soak in warm salt water for fifteen minutes, four times per day for bacterial infections. °· Your caregiver will prescribe an antibiotic if a bacterial infection is suspected. Take antibiotics as directed and finish the prescription, even if the problem appears to be improving before the medicine is gone. °· Only take over-the-counter or prescription medicines for pain, discomfort, or fever as directed by your caregiver. °SEEK IMMEDIATE MEDICAL CARE IF: °· There is redness, swelling, or increasing pain in the wound. °· Pus or any other unusual drainage is coming from the wound. °· An unexplained oral temperature above 102° F (38.9° C) develops. °· You notice a foul smell coming from the wound or dressing. °MAKE SURE YOU:  °· Understand these instructions. °· Monitor your condition. °· Contact your caregiver if you are getting worse or not improving. °  °This information is not intended to replace advice given to you by your health care provider. Make sure you discuss any questions you have with your  health care provider. °  °Document Released: 01/17/2005 Document Revised: 03/03/2012 Document Reviewed: 05/30/2015 °Elsevier Interactive Patient Education ©2016 [employer] ° °Paronychia °Paronychia is an infection of the skin that surrounds a nail. It usually affects the skin around a fingernail, but it may also occur near a toenail. It often causes pain and swelling around the nail. This condition may come on suddenly or develop over a longer period. In some cases, a collection of pus (abscess) can form near or under the nail. Usually, paronychia is not serious and it clears up with treatment. °CAUSES °This condition may be caused by bacteria or fungi. It is commonly caused by either Streptococcus or Staphylococcus bacteria. The bacteria or fungi often cause the infection by getting into the affected area through an opening in the skin, such as a cut or a hangnail. °RISK FACTORS °This condition is more likely to develop in: °· People who get their hands wet often, such as those who work as dishwashers, bartenders, or nurses. °· People who bite their fingernails or suck their thumbs. °· People who trim their nails too short. °· People who have hangnails or injured fingertips. °· People who get manicures. °· People who have diabetes. °SYMPTOMS °Symptoms of this condition include: °· Redness and swelling of the skin near the nail. °· Tenderness around the nail when you touch the area. °· Pus-filled bumps under the cuticle. The cuticle is the skin at the base or sides of the nail. °· Fluid or pus under the nail. °· Throbbing pain in   the area. °DIAGNOSIS °This condition is usually diagnosed with a physical exam. In some cases, a sample of pus may be taken from an abscess to be tested in a lab. This can help to determine what type of bacteria or fungi is causing the condition. °TREATMENT °Treatment for this condition depends on the cause and severity of the condition. If the condition is mild, it may clear up on its  own in a few days. Your health care provider may recommend soaking the affected area in warm water a few times a day. When treatment is needed, the options may include: °· Antibiotic medicine, if the condition is caused by a bacterial infection. °· Antifungal medicine, if the condition is caused by a fungal infection. °· Incision and drainage, if an abscess is present. In this procedure, the health care provider will cut open the abscess so the pus can drain out. °HOME CARE INSTRUCTIONS °· Soak the affected area in warm water if directed to do so by your health care provider. You may be told to do this for 20 minutes, 2-3 times a day. Keep the area dry in between soakings. °· Take medicines only as directed by your health care provider. °· If you were prescribed an antibiotic medicine, finish all of it even if you start to feel better. °· Keep the affected area clean. °· Do not try to drain a fluid-filled bump yourself. °· If you will be washing dishes or performing other tasks that require your hands to get wet, wear rubber gloves. You should also wear gloves if your hands might come in contact with irritating substances, such as cleaners or chemicals. °· Follow your health care provider's instructions about: °¨ Wound care. °¨ Bandage (dressing) changes and removal. °SEEK MEDICAL CARE IF: °· Your symptoms get worse or do not improve with treatment. °· You have a fever or chills. °· You have redness spreading from the affected area. °· You have continued or increased fluid, blood, or pus coming from the affected area. °· Your finger or knuckle becomes swollen or is difficult to move. °  °This information is not intended to replace advice given to you by your health care provider. Make sure you discuss any questions you have with your health care provider. °  °Document Released: 06/05/2001 Document Revised: 04/26/2015 Document Reviewed: 11/17/2014 °Elsevier Interactive Patient Education ©2016 [employer] ° °

## 2016-01-18 NOTE — ED Provider Notes (Signed)
CSN: [id number]     Arrival date & time 01/18/16  1847 History  By signing my name below, I, Stacey Reid, attest that this documentation has been prepared under the direction and in the presence of [name], PA-C. Electronically Signed: Randell Reid, ED Scribe. 01/18/2016. 8:25 PM.   Chief Complaint  Reid presents with  . Finger Injury   The history is provided by the Reid. No language interpreter was used.   HPI Comments: Stacey Reid is a 20 y.o. female who presents to the Emergency Department complaining of gradually worsening, moderate left little finger pain onset 3 days ago. Reid reports that she lacerated her left little finger on a piece of metal from a car door handle while at work 5 days ago, followed by onset of gradually worsening pain and swelling 3 days ago. Per Reid, she was seen 2 days ago at the [facility] ED for these symptoms and was prescribed Keflex and ibuprofen.  She cleaned the area with soap and water, applied alcohol, and stopped the bleeding and has been compliant with her medications with worsening of her symptoms. She endorses associated swelling, numbness over the past 2 days, and an inability to straighten the finger secondary to swelling and pain. She has tried warm water soaks while in the shower without relief. She denies fever.  Past Medical History  Diagnosis Date  . Asthma     years ago   Past Surgical History  Procedure Laterality Date  . No past surgeries     Family History  Problem Relation Age of Onset  . Asthma Mother   . Hypertension Mother    Social History  Substance Use Topics  . Smoking status: Never Smoker   . Smokeless tobacco: Never Used  . Alcohol Use: No   OB History    Gravida Para Term Preterm AB TAB SAB Ectopic Multiple Living   0 0 0 0 0 0 1     Review of Systems  Constitutional: Negative for fever.  Musculoskeletal: Positive for myalgias (Right little finger).       Allergies  Review of Reid's allergies indicates no known allergies.  Home Medications   Prior to Admission medications   Medication Sig Start Date End Date Taking? Authorizing Provider  cephALEXin (KEFLEX) 500 MG capsule Take 1 capsule (500 mg total) by mouth 4 (four) times daily. 01/16/16   Cheri Fowler, PA-C  cyclobenzaprine (FLEXERIL) 10 MG tablet Take 1 tablet (10 mg total) by mouth 3 (three) times daily as needed for muscle spasms. 10/29/15   Mercedes Camprubi-Soms, PA-C  ibuprofen (CHILDRENS IBUPROFEN) 100 MG/5ML suspension Take 30 mLs (600 mg total) by mouth every 6 (six) hours as needed. 01/16/16   Cheri Fowler, PA-C  metroNIDAZOLE (FLAGYL) 500 MG tablet Take 1 tablet (500 mg total) by mouth 2 (two) times daily. X 7 days 01/16/16   Domenick Gong, MD   BP 112/97 mmHg  Pulse 80  Temp(Src) 98.5 F (36.9 C) (Oral)  Resp 16  Ht  (1.575 m)  Wt 135 lb (61.236 kg)  BMI 24.69 kg/m2  SpO2 98%  LMP 01/04/2016 Physical Exam  Physical Exam  Constitutional: Pt is oriented to person, place, and time. Pt appears well-developed and well-nourished. No distress.  HENT:  Head: Normocephalic and atraumatic.  Eyes: Conjunctivae are normal. No scleral icterus.  Neck: Normal range of motion.  Cardiovascular: Normal rate, regular rhythm and intact distal pulses.   Pulmonary/Chest: Effort normal and breath  sounds normal.  Abdominal: Soft. Pt exhibits no distension. There is no tenderness.  Lymphadenopathy:    Pt has no cervical adenopathy.  Neurological: Pt is alert and oriented to person, place, and time.  Skin: Skin is warm and dry. Pt is not diaphoretic. There is erythema.  Tenderness and swelling from the proximal PCP joint of the fifth digit distallly on the right hand. There is an area of redness and induration at the distal tip of the fifth digit. Good radial pulse. Holding in flexion but able to extend joint. Area of purulence at the proximal nail fold.  Psychiatric: Pt has a  normal mood and affect.  Nursing note and vitals reviewed.  ED Course  Drain paronychia Date/Time: 01/18/2016 8:56 PM Performed by: [name] Authorized by: [name] Consent: Verbal consent obtained. Risks and benefits: risks, benefits and alternatives were discussed Consent given by: Reid Reid identity confirmed: verbally with Reid Time out: Immediately prior to procedure a "time out" was called to verify the correct Reid, procedure, equipment, support staff and site/side marked as required. Preparation: Reid was prepped and draped in the usual sterile fashion. Local anesthesia used: yes Anesthesia: digital block Local anesthetic: lidocaine 1% without epinephrine Anesthetic total: 6 ml Reid sedated: no Reid tolerance: Reid tolerated the procedure well with no immediate complications    DIAGNOSTIC STUDIES: Oxygen Saturation is 98% on RA, normal by my interpretation.    COORDINATION OF CARE: 7:31 PM Will update tetanus and perform I&D. Discussed treatment plan with pt at bedside and pt agreed to plan.  8:13 PM Performed I&D of right little finger.  Labs Review Labs Reviewed - No data to display  Imaging Review No results found. I have personally reviewed and evaluated these images and lab results as part of my medical decision-making.   EKG Interpretation None      MDM   Final diagnoses:  Paronychia, right    Reid with skin abscess amenable to incision and drainage.  Abscess was not large enough to warrant packing or drain,  wound recheck in 2 days. Encouraged home warm soaks and flushing.  Mild signs of cellulitis is surrounding skin.  Will d/c to home.  No antibiotic therapy is indicated.  I personally performed the services described in this documentation, which was scribed in my presence. The recorded information has been reviewed and is accurate.       [name], PA-C 01/18/16 2057  Raeford Razor, MD 01/25/16  1100

## 2016-01-18 NOTE — ED Notes (Signed)
The pt cut her rt little finger on a piece of metal  On Friday.  She still has pain.and numbness.  She saw a doctor 2-3 days ago  For the same.  lmp  This month

## 2016-01-18 NOTE — ED Notes (Signed)
Pts finger wrapped in xeroform and gauze per PAs request. Good cap refill noted after bandage.

## 2016-01-20 ENCOUNTER — Telehealth (HOSPITAL_COMMUNITY): Payer: Self-pay | Admitting: Emergency Medicine

## 2016-01-20 NOTE — ED Notes (Signed)
Unable to LM on [phone] and on [phone]... Both #'s invalid Need to give lab results from recent visit on 01/16/16  Per Dr. Dayton Scrape,  Pt received rx for metronidazole at [facility] visit 01/16/16. No further tx required unless sx's persist. Please let patient know that gonorrhea/chlamydia tests were negative  Will try later.

## 2016-02-02 ENCOUNTER — Emergency Department (HOSPITAL_COMMUNITY)
Admission: EM | Admit: 2016-02-02 | Discharge: 2016-02-02 | Disposition: A | Discharge status: Home / Self Care | Payer: Medicaid Other | Attending: Emergency Medicine | Admitting: Emergency Medicine

## 2016-02-02 ENCOUNTER — Encounter (HOSPITAL_COMMUNITY): Payer: Self-pay | Admitting: Emergency Medicine

## 2016-02-02 ENCOUNTER — Emergency Department (HOSPITAL_COMMUNITY): Payer: Medicaid Other

## 2016-02-02 DIAGNOSIS — N898 Other specified noninflammatory disorders of vagina: Secondary | ICD-10-CM | POA: Insufficient documentation

## 2016-02-02 DIAGNOSIS — Z3A01 Less than 8 weeks gestation of pregnancy: Secondary | ICD-10-CM | POA: Diagnosis not present

## 2016-02-02 DIAGNOSIS — H6121 Impacted cerumen, right ear: Secondary | ICD-10-CM | POA: Diagnosis not present

## 2016-02-02 DIAGNOSIS — R1030 Lower abdominal pain, unspecified: Secondary | ICD-10-CM | POA: Diagnosis not present

## 2016-02-02 DIAGNOSIS — O9989 Other specified diseases and conditions complicating pregnancy, childbirth and the puerperium: Secondary | ICD-10-CM | POA: Diagnosis present

## 2016-02-02 DIAGNOSIS — O99511 Diseases of the respiratory system complicating pregnancy, first trimester: Secondary | ICD-10-CM | POA: Diagnosis not present

## 2016-02-02 DIAGNOSIS — J45909 Unspecified asthma, uncomplicated: Secondary | ICD-10-CM | POA: Insufficient documentation

## 2016-02-02 DIAGNOSIS — Z3490 Encounter for supervision of normal pregnancy, unspecified, unspecified trimester: Secondary | ICD-10-CM

## 2016-02-02 DIAGNOSIS — Z79899 Other long term (current) drug therapy: Secondary | ICD-10-CM | POA: Diagnosis not present

## 2016-02-02 DIAGNOSIS — O99351 Diseases of the nervous system complicating pregnancy, first trimester: Secondary | ICD-10-CM | POA: Diagnosis not present

## 2016-02-02 DIAGNOSIS — R350 Frequency of micturition: Secondary | ICD-10-CM | POA: Diagnosis not present

## 2016-02-02 DIAGNOSIS — Z792 Long term (current) use of antibiotics: Secondary | ICD-10-CM | POA: Insufficient documentation

## 2016-02-02 DIAGNOSIS — Z3491 Encounter for supervision of normal pregnancy, unspecified, first trimester: Secondary | ICD-10-CM

## 2016-02-02 LAB — URINALYSIS, ROUTINE W REFLEX MICROSCOPIC
Bilirubin Urine: NEGATIVE
Glucose, UA: NEGATIVE mg/dL
Hgb urine dipstick: NEGATIVE
KETONES UR: NEGATIVE mg/dL
LEUKOCYTES UA: NEGATIVE
NITRITE: NEGATIVE
PROTEIN: NEGATIVE mg/dL
Specific Gravity, Urine: 1.018 (ref 1.005–1.030)
pH: 5.5 (ref 5.0–8.0)

## 2016-02-02 LAB — RH IG WORKUP (INCLUDES ABO/RH)
ABO/RH(D): O POS
GESTATIONAL AGE(WKS): 6

## 2016-02-02 LAB — WET PREP, GENITAL
SPERM: NONE SEEN
Trich, Wet Prep: NONE SEEN
Yeast Wet Prep HPF POC: NONE SEEN

## 2016-02-02 LAB — POC URINE PREG, ED: PREG TEST UR: POSITIVE — AB

## 2016-02-02 LAB — HCG, QUANTITATIVE, PREGNANCY: hCG, Beta Chain, Quant, S: 7211 m[IU]/mL — ABNORMAL HIGH (ref <5)

## 2016-02-02 MED ORDER — METRONIDAZOLE 500 MG PO TABS: 500.0000 mg | ORAL_TABLET | Freq: Two times a day (BID) | ORAL | Status: DC

## 2016-02-02 MED ORDER — METRONIDAZOLE 500 MG PO TABS
500.0000 mg | ORAL_TABLET | Freq: Once | ORAL | Status: AC
  Administered 2016-02-02: 500 mg via ORAL
  Filled 2016-02-02: qty 1

## 2016-02-02 MED ORDER — DOCUSATE SODIUM 50 MG/5ML PO LIQD
200.0000 mg | Freq: Once | ORAL | Status: AC
  Administered 2016-02-02: 200 mg via OTIC
  Filled 2016-02-02: qty 20

## 2016-02-02 MED ORDER — PRENATAL COMPLETE 14-0.4 MG PO TABS: 1.0000 | ORAL_TABLET | Freq: Every day | ORAL | Status: DC

## 2016-02-02 NOTE — Discharge Instructions (Signed)
Follow with OB/GYN as soon as possible.   Do NOT take any NSAIDs, such as Aspirin, Motrin, Ibuprofen, Aleve, Naproxen etc. Only take Tylenol for pain. Return to the emergency room  for any severe abdominal pain, increasing vaginal bleeding, passing out or repeated vomiting.   Use Debrox once a month for 3 days to soften ceruman and prevent future impactions. NEVER use a Q tip in the ear canal   Please follow with your primary care doctor in the next 2 days for a check-up. They must obtain records for further management.   Do not hesitate to return to the Emergency Department for any new, worsening or concerning symptoms.    First Trimester of Pregnancy The first trimester of pregnancy is from week 1 until the end of week 12 (months 1 through 3). During this time, your baby will begin to develop inside you. At 6-8 weeks, the eyes and face are formed, and the heartbeat can be seen on ultrasound. At the end of 12 weeks, all the baby's organs are formed. Prenatal care is all the medical care you receive before the birth of your baby. Make sure you get good prenatal care and follow all of your doctor's instructions. HOME CARE  Medicines  Take medicine only as told by your doctor. Some medicines are safe and some are not during pregnancy.  Take your prenatal vitamins as told by your doctor.  Take medicine that helps you poop (stool softener) as needed if your doctor says it is okay. Diet  Eat regular, healthy meals.  Your doctor will tell you the amount of weight gain that is right for you.  Avoid raw meat and uncooked cheese.  If you feel sick to your stomach (nauseous) or throw up (vomit):  Eat 4 or 5 small meals a day instead of 3 large meals.  Try eating a few soda crackers.  Drink liquids between meals instead of during meals.  If you have a hard time pooping (constipation):  Eat high-fiber foods like fresh vegetables, fruit, and whole grains.  Drink enough fluids to keep your  pee (urine) clear or pale yellow. Activity and Exercise  Exercise only as told by your doctor. Stop exercising if you have cramps or pain in your lower belly (abdomen) or low back.  Try to avoid standing for long periods of time. Move your legs often if you must stand in one place for a long time.  Avoid heavy lifting.  Wear low-heeled shoes. Sit and stand up straight.  You can have sex unless your doctor tells you not to. Relief of Pain or Discomfort  Wear a good support bra if your breasts are sore.  Take warm water baths (sitz baths) to soothe pain or discomfort caused by hemorrhoids. Use hemorrhoid cream if your doctor says it is okay.  Rest with your legs raised if you have leg cramps or low back pain.  Wear support hose if you have puffy, bulging veins (varicose veins) in your legs. Raise (elevate) your feet for 15 minutes, 3-4 times a day. Limit salt in your diet. Prenatal Care  Schedule your prenatal visits by the twelfth week of pregnancy.  Write down your questions. Take them to your prenatal visits.  Keep all your prenatal visits as told by your doctor. Safety  Wear your seat belt at all times when driving.  Make a list of emergency phone numbers. The list should include numbers for family, friends, the hospital, and police and fire departments. General  Tips  Ask your doctor for a referral to a local prenatal class. Begin classes no later than at the start of month 6 of your pregnancy.  Ask for help if you need counseling or help with nutrition. Your doctor can give you advice or tell you where to go for help.  Do not use hot tubs, steam rooms, or saunas.  Do not douche or use tampons or scented sanitary pads.  Do not cross your legs for long periods of time.  Avoid litter boxes and soil used by cats.  Avoid all smoking, herbs, and alcohol. Avoid drugs not approved by your doctor.  Do not use any tobacco products, including cigarettes, chewing tobacco, and  electronic cigarettes. If you need help quitting, ask your doctor. You may get counseling or other support to help you quit.  Visit your dentist. At home, brush your teeth with a soft toothbrush. Be gentle when you floss. GET HELP IF:  You are dizzy.  You have mild cramps or pressure in your lower belly.  You have a nagging pain in your belly area.  You continue to feel sick to your stomach, throw up, or have watery poop (diarrhea).  You have a bad smelling fluid coming from your vagina.  You have pain with peeing (urination).  You have increased puffiness (swelling) in your face, hands, legs, or ankles. GET HELP RIGHT AWAY IF:   You have a fever.  You are leaking fluid from your vagina.  You have spotting or bleeding from your vagina.  You have very bad belly cramping or pain.  You gain or lose weight rapidly.  You throw up blood. It may look like coffee grounds.  You are around people who have [place] measles, fifth disease, or chickenpox.  You have a very bad headache.  You have shortness of breath.  You have any kind of trauma, such as from a fall or a car accident.   This information is not intended to replace advice given to you by your health care provider. Make sure you discuss any questions you have with your health care provider.   Document Released: 05/28/2008 Document Revised: 12/31/2014 Document Reviewed: 10/20/2013 Elsevier Interactive Patient Education [employer]

## 2016-02-02 NOTE — ED Notes (Signed)
See PA assessment 

## 2016-02-02 NOTE — ED Notes (Signed)
Patient states R ear pain that started last week.  Patient states worsened last night and now this morning her hearing is muffled.   Patient states also needs pregnancy test.   Patient does complain of discharge.

## 2016-02-02 NOTE — ED Provider Notes (Signed)
CSN: [id number]     Arrival date & time 02/02/16  0831 History   By signing my name below, I, Stacey Reid, attest that this documentation has been prepared under the direction and in the presence of Wynetta Emery, PA-C Electronically Signed: [name], ED Scribe 02/02/2016 at 11:54 AM.  Chief Complaint  Patient presents with  . Otalgia  . Possible Pregnancy   The history is provided by the patient. No language interpreter was used.   HPI Comments: Stacey Reid is a 20 y.o. female who presents to the Emergency Department complaining of right ear pressure and decreased hearing acuity, worsening over the course of 5 days. She also states that she is 8 days late on her period. Pt had a negative at home pregnancy test last night and a positive pregnancy test this morning. She reports associated symptoms of urinary frequency, vaginal discharge and 6/10 midline lower abdominal pain. Pt denies palpitations, chest pain, SOB, spotting, syncope, fever, chills, nausea, vomiting, change in defecation. Pt's LNMP was 12/27/15 that lasted for 6 days; pt reports that this is a typical menses since childbirth 10 months ago.              Past Medical History  Diagnosis Date  . Asthma     years ago   Past Surgical History  Procedure Laterality Date  . No past surgeries     Family History  Problem Relation Age of Onset  . Asthma Mother   . Hypertension Mother    Social History  Substance Use Topics  . Smoking status: Never Smoker   . Smokeless tobacco: Never Used  . Alcohol Use: No   OB History    Gravida Para Term Preterm AB TAB SAB Ectopic Multiple Living   1 1 1  0 0 0 0 0 0 1     Review of Systems A complete 10 system review of systems was obtained and all systems are negative except as noted in the HPI and PMH.   Allergies  Review of patient's allergies indicates no known allergies.  Home Medications   Prior to Admission medications   Medication Sig Start Date End Date  Taking? Authorizing Provider  cephALEXin (KEFLEX) 500 MG capsule Take 1 capsule (500 mg total) by mouth 4 (four) times daily. 01/16/16   Cheri Fowler, PA-C  cyclobenzaprine (FLEXERIL) 10 MG tablet Take 1 tablet (10 mg total) by mouth 3 (three) times daily as needed for muscle spasms. 10/29/15   Mercedes Camprubi-Soms, PA-C  ibuprofen (CHILDRENS IBUPROFEN) 100 MG/5ML suspension Take 30 mLs (600 mg total) by mouth every 6 (six) hours as needed. 01/16/16   Cheri Fowler, PA-C  metroNIDAZOLE (FLAGYL) 500 MG tablet Take 1 tablet (500 mg total) by mouth 2 (two) times daily. One tab PO bid x 10 days 02/02/16   Wynetta Emery, PA-C  Prenatal Vit-Fe Fumarate-FA (PRENATAL COMPLETE) 14-0.4 MG TABS Take 1 tablet by mouth daily. 02/02/16   Keeanna Villafranca, PA-C  traMADol (ULTRAM) 50 MG tablet Take 1 tablet (50 mg total) by mouth every 6 (six) hours as needed. 01/18/16   Abigail Harris, PA-C   BP 132/66 mmHg  Pulse 82  Temp(Src) 98.6 F (37 C) (Oral)  Resp 18  SpO2 99%  LMP 01/04/2016 Physical Exam  Constitutional: She is oriented to person, place, and time. She appears well-developed and well-nourished. No distress.  HENT:  Head: Normocephalic and atraumatic.  Right Ear: External ear normal.  Left Ear: External ear normal.  Mouth/Throat: Oropharynx is  clear and moist.  Eyes: Conjunctivae and EOM are normal.  Neck: Normal range of motion. No tracheal deviation present.  Cardiovascular: Normal rate.   Pulmonary/Chest: Effort normal. No stridor. No respiratory distress.  Abdominal: Soft. Bowel sounds are normal. She exhibits no distension and no mass. There is no tenderness. There is no rebound and no guarding.  Genitourinary:  No CVA tenderness to percussion bilaterally  Pelvic exam is chaperoned by technician: No rashes or lesions, there is a thin, white, homogenous, non-foul-smelling vaginal discharge. There is no cervical motion or adnexal tenderness.  Musculoskeletal: Normal range of motion.   Neurological: She is alert and oriented to person, place, and time.  Skin: Skin is warm and dry.  Psychiatric: She has a normal mood and affect. Her behavior is normal.  Nursing note and vitals reviewed.  ED Course  .Ear Cerumen Removal Date/Time: 02/02/2016 12:35 PM Performed by: Wynetta Emery Authorized by: Wynetta Emery Consent: Verbal consent obtained. Consent given by: patient Patient identity confirmed: verbally with patient Local anesthetic: none Ceruminolytics applied: Ceruminolytics applied prior to the procedure. Location details: right ear Procedure type: curette and irrigation Patient sedated: no Patient tolerance: Patient tolerated the procedure well with no immediate complications Comments: Curette and irrigation of the ear still has residual cerumen with impaction.    (including critical care time) DIAGNOSTIC STUDIES: Oxygen Saturation is 99% on RA, normal by my interpretation.    COORDINATION OF CARE: 9:35 AM-Discussed treatment plan which includes diagnostic lab work with pt at bedside and pt agreed to plan.   Labs Review Labs Reviewed  WET PREP, GENITAL - Abnormal; Notable for the following:    Clue Cells Wet Prep HPF POC PRESENT (*)    WBC, Wet Prep HPF POC MODERATE (*)    All other components within normal limits  URINALYSIS, ROUTINE W REFLEX MICROSCOPIC (NOT AT [facility]) - Abnormal; Notable for the following:    APPearance CLOUDY (*)    All other components within normal limits  HCG, QUANTITATIVE, PREGNANCY - Abnormal; Notable for the following:    hCG, Beta Chain, Quant, S 7211 (*)    All other components within normal limits  POC URINE PREG, ED - Abnormal; Notable for the following:    Preg Test, Ur POSITIVE (*)    All other components within normal limits  RPR  HIV ANTIBODY (ROUTINE TESTING)  RH IG WORKUP (INCLUDES ABO/RH)  GC/CHLAMYDIA PROBE AMP ([place]) NOT AT [facility]   Imaging Review US Ob Comp Less 14 Wks  02/02/2016  CLINICAL DATA:   Lower abdominal and pelvic pain EXAM: OBSTETRIC <14 WK [place] AND TRANSVAGINAL OB US TECHNIQUE: Both transabdominal and transvaginal ultrasound examinations were performed for complete evaluation of the gestation as well as the maternal uterus, adnexal regions, and pelvic cul-de-sac. Transvaginal technique was performed to assess early pregnancy. COMPARISON:  None. FINDINGS: Intrauterine gestational sac: Visualized/normal in shape. Yolk sac:  Visualized Embryo:  Not visualized Cardiac Activity: Not visualized MSD: 6  mm   5 w   2  d Subchorionic hemorrhage:  None visualized. Maternal uterus/adnexae: Cervical os is closed. Right ovary measures 3.2 x 1.3 x 3.5 cm. Left ovary measures 3.0 x 1.6 x 2.0 cm. There is a corpus luteum in the left ovary measuring 1.9 x 1.7 x 1.3 cm. No other pelvic mass seen. There is trace free pelvic fluid. IMPRESSION: There is an intrauterine gestational sac containing a yolk sac. Fetal pole not yet seen. Based on gestational sac diameter, estimated gestational age is  5+ weeks. Followup study in approximately 2 weeks to assess for fetal pole and fetal cardiac activity is advised. Trace free pelvic fluid is likely physiologic. Electronically Signed   By: Bretta Bang III M.D.   On: 02/02/2016 12:04   US Ob Transvaginal  02/02/2016  CLINICAL DATA:  Lower abdominal and pelvic pain EXAM: OBSTETRIC <14 WK [place] AND TRANSVAGINAL OB US TECHNIQUE: Both transabdominal and transvaginal ultrasound examinations were performed for complete evaluation of the gestation as well as the maternal uterus, adnexal regions, and pelvic cul-de-sac. Transvaginal technique was performed to assess early pregnancy. COMPARISON:  None. FINDINGS: Intrauterine gestational sac: Visualized/normal in shape. Yolk sac:  Visualized Embryo:  Not visualized Cardiac Activity: Not visualized MSD: 6  mm   5 w   2  d Subchorionic hemorrhage:  None visualized. Maternal uterus/adnexae: Cervical os is closed. Right ovary measures 3.2 x  1.3 x 3.5 cm. Left ovary measures 3.0 x 1.6 x 2.0 cm. There is a corpus luteum in the left ovary measuring 1.9 x 1.7 x 1.3 cm. No other pelvic mass seen. There is trace free pelvic fluid. IMPRESSION: There is an intrauterine gestational sac containing a yolk sac. Fetal pole not yet seen. Based on gestational sac diameter, estimated gestational age is 5+ weeks. Followup study in approximately 2 weeks to assess for fetal pole and fetal cardiac activity is advised. Trace free pelvic fluid is likely physiologic. Electronically Signed   By: Bretta Bang III M.D.   On: 02/02/2016 12:04   I have personally reviewed and evaluated these images and lab results as part of my medical decision-making.   EKG Interpretation None      MDM   Final diagnoses:  Right ear impacted cerumen  Normal pregnancy, first trimester    Filed Vitals:   02/02/16 0849 02/02/16 1236  BP: 132/66 120/60  Pulse: 82 75  Temp: 98.6 F (37 C)   TempSrc: Oral   Resp: 18 18  SpO2: 99% 99%    Medications  docusate (COLACE) 50 MG/5ML liquid 200 mg (200 mg Right Ear Given by Other 02/02/16 1237)  metroNIDAZOLE (FLAGYL) tablet 500 mg (500 mg Oral Given 02/02/16 1236)    COTI BURD is 20 y.o. female presenting with right ear cerumen impaction and she is also a days late on her period, she reports lower abdominal discomfort and vaginal discharge. Abdominal exam is nonsurgical, vital signs stable. Positive urine pregnancy test, beta hCG is 7000. UA without signs of infection. Wet prep with no cervical motion tenderness suggestive of PID. There is a moderate amount of white blood cells and also clue cells, we'll treat for a bacterial vaginosis, this is likely the source of her vaginal discharge. Ultrasound shows a IUP is likely greater than 5 weeks. Patient follows at [facility] hospital, asked her to follow closely with them and with had extensive discussion of return precautions. Patient verbalizes  understanding.  Patient has cerumen impaction in the right ear, initially tried to curette remove it however there were still cerumen, patient was allowed to sit with Dr. Brunetta Genera to soften the earwax for 10 minutes and I irrigated it with over 1 L of 50% warm water and 50% hydrogen peroxide and the impaction still remained. Patient is given a referral to ENT. Advised her to start using DeBrox.  Evaluation does not show pathology that would require ongoing emergent intervention or inpatient treatment. Pt is hemodynamically stable and mentating appropriately. Discussed findings and plan with patient/guardian, who agrees with care  plan. All questions answered. Return precautions discussed and outpatient follow up given.   New Prescriptions   METRONIDAZOLE (FLAGYL) 500 MG TABLET    Take 1 tablet (500 mg total) by mouth 2 (two) times daily. One tab PO bid x 10 days   PRENATAL VIT-FE FUMARATE-FA (PRENATAL COMPLETE) 14-0.4 MG TABS    Take 1 tablet by mouth daily.     I personally performed the services described in this documentation, which was scribed in my presence. The recorded information has been reviewed and is accurate.   Wynetta Emery, PA-C 02/02/16 1239  Arby Barrette, MD 02/03/16 [phone]

## 2016-02-03 ENCOUNTER — Telehealth (HOSPITAL_BASED_OUTPATIENT_CLINIC_OR_DEPARTMENT_OTHER): Payer: Self-pay | Admitting: Emergency Medicine

## 2016-02-03 LAB — RPR: RPR Ser Ql: NONREACTIVE

## 2016-02-03 LAB — HIV ANTIBODY (ROUTINE TESTING W REFLEX): HIV Screen 4th Generation wRfx: NONREACTIVE

## 2016-02-03 LAB — GC/CHLAMYDIA PROBE AMP (~~LOC~~) NOT AT ARMC
CHLAMYDIA, DNA PROBE: NEGATIVE
Neisseria Gonorrhea: NEGATIVE

## 2016-02-04 NOTE — ED Notes (Signed)
Called pt and notified of recent lab results from visit 1/23 Pt ID'd properly... Reports feeling better and sx have subsided and has completed Flagyl  Per Dr. Dayton Scrape,  Gardnerella (causes bacterial vaginosis) test positive. Prescription given for metronidazole at urgent care visit 01/24/16 should cover it. Recheck or followup pcp/David Hopper if symptoms persist. Ria Clock MD  Notes Recorded by Eustace Moore, MD on 01/25/2016 at 3:27 PM Tests for gonorrhea/chlamydia, and HIV done at urgent care visit 01/24/16 are negative   Adv pt if sx are not getting better to return  Education on safe sex given Pt verb understanding.  Faxed documentation to [facility]

## 2016-02-06 ENCOUNTER — Telehealth (HOSPITAL_COMMUNITY): Payer: Self-pay

## 2016-02-08 ENCOUNTER — Inpatient Hospital Stay (HOSPITAL_COMMUNITY)
Admission: AD | Admit: 2016-02-08 | Discharge: 2016-02-08 | Disposition: A | Discharge status: Home / Self Care | Payer: Medicaid Other | Source: Ambulatory Visit | Attending: Obstetrics | Admitting: Obstetrics

## 2016-02-08 ENCOUNTER — Encounter (HOSPITAL_COMMUNITY): Payer: Self-pay | Admitting: *Deleted

## 2016-02-08 DIAGNOSIS — K117 Disturbances of salivary secretion: Secondary | ICD-10-CM

## 2016-02-08 DIAGNOSIS — O9989 Other specified diseases and conditions complicating pregnancy, childbirth and the puerperium: Secondary | ICD-10-CM

## 2016-02-08 DIAGNOSIS — M549 Dorsalgia, unspecified: Secondary | ICD-10-CM | POA: Diagnosis not present

## 2016-02-08 DIAGNOSIS — R110 Nausea: Secondary | ICD-10-CM | POA: Diagnosis not present

## 2016-02-08 DIAGNOSIS — O26891 Other specified pregnancy related conditions, first trimester: Secondary | ICD-10-CM | POA: Insufficient documentation

## 2016-02-08 DIAGNOSIS — J45909 Unspecified asthma, uncomplicated: Secondary | ICD-10-CM | POA: Diagnosis not present

## 2016-02-08 DIAGNOSIS — O219 Vomiting of pregnancy, unspecified: Secondary | ICD-10-CM

## 2016-02-08 DIAGNOSIS — Z3A01 Less than 8 weeks gestation of pregnancy: Secondary | ICD-10-CM | POA: Insufficient documentation

## 2016-02-08 DIAGNOSIS — R1030 Lower abdominal pain, unspecified: Secondary | ICD-10-CM | POA: Diagnosis not present

## 2016-02-08 LAB — URINALYSIS, ROUTINE W REFLEX MICROSCOPIC
BILIRUBIN URINE: NEGATIVE
GLUCOSE, UA: NEGATIVE mg/dL
HGB URINE DIPSTICK: NEGATIVE
KETONES UR: 15 mg/dL — AB
Leukocytes, UA: NEGATIVE
Nitrite: NEGATIVE
PH: 6.5 (ref 5.0–8.0)
Protein, ur: NEGATIVE mg/dL
SPECIFIC GRAVITY, URINE: 1.02 (ref 1.005–1.030)

## 2016-02-08 MED ORDER — GLYCOPYRROLATE 1 MG PO TABS: 1.0000 mg | ORAL_TABLET | Freq: Three times a day (TID) | ORAL | Status: DC | PRN

## 2016-02-08 MED ORDER — PROMETHAZINE HCL 25 MG PO TABS: 25.0000 mg | ORAL_TABLET | Freq: Four times a day (QID) | ORAL | Status: DC | PRN

## 2016-02-08 NOTE — MAU Provider Note (Signed)
Chief Complaint: Abdominal Pain and Back Pain   First Provider Initiated Contact with Patient 02/08/16 1135        SUBJECTIVE HPI  Stacey Reid is a 20 y.o. G2P1001 at [redacted]w[redacted]d by LMP who presents to maternity admissions reporting lower abdominal pain that comes and goes and radiates to lower back for several days.  No bleeding. Starting to have some nausea and ptyalism now.  Had a lot of both last year with prior pregnancy.  Used phenergan successfully for that.   Scheduled to see Dr Gaynell Face later this month. She denies vaginal bleeding, vaginal itching/burning, urinary symptoms, h/a, dizziness, n/v, or fever/chills.    RN Note: Been having pain in lower abd and low back, 4 days now.         Past Medical History  Diagnosis Date  . Asthma     years ago   Past Surgical History  Procedure Laterality Date  . No past surgeries     Social History   Social History  . Marital Status: Single    Spouse Name: N/A  . Number of Children: N/A  . Years of Education: N/A   Occupational History  . Not on file.   Social History Main Topics  . Smoking status: Never Smoker   . Smokeless tobacco: Never Used  . Alcohol Use: No  . Drug Use: No  . Sexual Activity: Yes    Birth Control/ Protection: None     Comment: i month ago   Other Topics Concern  . Not on file   Social History Narrative   No current facility-administered medications on file prior to encounter.   Current Outpatient Prescriptions on File Prior to Encounter  Medication Sig Dispense Refill  . cephALEXin (KEFLEX) 500 MG capsule Take 1 capsule (500 mg total) by mouth 4 (four) times daily. 20 capsule 0  . cyclobenzaprine (FLEXERIL) 10 MG tablet Take 1 tablet (10 mg total) by mouth 3 (three) times daily as needed for muscle spasms. 15 tablet 0  . ibuprofen (CHILDRENS IBUPROFEN) 100 MG/5ML suspension Take 30 mLs (600 mg total) by mouth every 6 (six) hours as needed. 473 mL 0  . metroNIDAZOLE (FLAGYL) 500 MG  tablet Take 1 tablet (500 mg total) by mouth 2 (two) times daily. One tab PO bid x 10 days 20 tablet 0  . Prenatal Vit-Fe Fumarate-FA (PRENATAL COMPLETE) 14-0.4 MG TABS Take 1 tablet by mouth daily. 60 each 1  . traMADol (ULTRAM) 50 MG tablet Take 1 tablet (50 mg total) by mouth every 6 (six) hours as needed. 15 tablet 0   No Known Allergies  I have reviewed patient's Past Medical Hx, Surgical Hx, Family Hx, Social Hx, medications and allergies.   ROS:  Review of Systems   Constitutional: Negative for fever and chills.  Gastrointestinal: Negative for vomiting, abdominal pain, diarrhea and constipation. + for nausea and spitting Genitourinary: Negative for dysuria. Has some frequency.  Cramping lower abdomen, moves to low back sometimes Musculoskeletal: Negative for back pain.  Neurological: Negative for dizziness and weakness.    Physical Exam  Patient Vitals for the past 24 hrs:  BP Temp Temp src Pulse Resp Height Weight  02/08/16 1051 118/70 mmHg 98 F (36.7 C) Oral 90 16 5\' 3"  (1.6 m) 138 lb 6.4 oz (62.778 kg)    Physical Exam   Constitutional: Well-developed, well-nourished female in no acute distress.  Cardiovascular: normal rate Respiratory: normal effort GI: Abd soft, non-tender. Pos BS x 4 MS: Extremities  nontender, no edema, normal ROM Neurologic: Alert and oriented x 4.  GU: Neg CVAT.  PELVIC EXAM: Cervix 0/long/high, firm, neg CMT, uterus nontender, nonenlarged, adnexa without tenderness, or mass  6 wk size  LAB RESULTS No results found for this or any previous visit (from the past 24 hour(s)).  --/--/O POS (02/09 0956)  IMAGING US Ob Comp Less 14 Wks  02/02/2016  CLINICAL DATA:  Lower abdominal and pelvic pain EXAM: OBSTETRIC <14 WK [place] AND TRANSVAGINAL OB US TECHNIQUE: Both transabdominal and transvaginal ultrasound examinations were performed for complete evaluation of the gestation as well as the maternal uterus, adnexal regions, and pelvic cul-de-sac.  Transvaginal technique was performed to assess early pregnancy. COMPARISON:  None. FINDINGS: Intrauterine gestational sac: Visualized/normal in shape. Yolk sac:  Visualized Embryo:  Not visualized Cardiac Activity: Not visualized MSD: 6  mm   5 w   2  d Subchorionic hemorrhage:  None visualized. Maternal uterus/adnexae: Cervical os is closed. Right ovary measures 3.2 x 1.3 x 3.5 cm. Left ovary measures 3.0 x 1.6 x 2.0 cm. There is a corpus luteum in the left ovary measuring 1.9 x 1.7 x 1.3 cm. No other pelvic mass seen. There is trace free pelvic fluid. IMPRESSION: There is an intrauterine gestational sac containing a yolk sac. Fetal pole not yet seen. Based on gestational sac diameter, estimated gestational age is 5+ weeks. Followup study in approximately 2 weeks to assess for fetal pole and fetal cardiac activity is advised. Trace free pelvic fluid is likely physiologic. Electronically Signed   By: Bretta Bang III M.D.   On: 02/02/2016 12:04   US Ob Transvaginal  02/02/2016  CLINICAL DATA:  Lower abdominal and pelvic pain EXAM: OBSTETRIC <14 WK [place] AND TRANSVAGINAL OB US TECHNIQUE: Both transabdominal and transvaginal ultrasound examinations were performed for complete evaluation of the gestation as well as the maternal uterus, adnexal regions, and pelvic cul-de-sac. Transvaginal technique was performed to assess early pregnancy. COMPARISON:  None. FINDINGS: Intrauterine gestational sac: Visualized/normal in shape. Yolk sac:  Visualized Embryo:  Not visualized Cardiac Activity: Not visualized MSD: 6  mm   5 w   2  d Subchorionic hemorrhage:  None visualized. Maternal uterus/adnexae: Cervical os is closed. Right ovary measures 3.2 x 1.3 x 3.5 cm. Left ovary measures 3.0 x 1.6 x 2.0 cm. There is a corpus luteum in the left ovary measuring 1.9 x 1.7 x 1.3 cm. No other pelvic mass seen. There is trace free pelvic fluid. IMPRESSION: There is an intrauterine gestational sac containing a yolk sac. Fetal pole not  yet seen. Based on gestational sac diameter, estimated gestational age is 5+ weeks. Followup study in approximately 2 weeks to assess for fetal pole and fetal cardiac activity is advised. Trace free pelvic fluid is likely physiologic. Electronically Signed   By: Bretta Bang III M.D.   On: 02/02/2016 12:04    MAU Management/MDM: Ordered labs and reviewed results.  No sign of UTI  Treatments in MAU included none.    Cultures were done on last visit  to rule out pelvic infection and were negative   Pt stable at time of discharge.  ASSESSMENT Single IUP at [redacted]w[redacted]d  Lower abdominal pain, probably related to growing uterus No sign of UTI or other obvious source for pain Nausea of early pregnancy Ptyalism  PLAN Discharge home Rx Phenergan for nausea Rx Robinul for ptyalism Follow up with Dr Gaynell Face for new OB visit    Medication List  ASK your doctor about these medications        cephALEXin 500 MG capsule  Commonly known as:  KEFLEX  Take 1 capsule (500 mg total) by mouth 4 (four) times daily.     cyclobenzaprine 10 MG tablet  Commonly known as:  FLEXERIL  Take 1 tablet (10 mg total) by mouth 3 (three) times daily as needed for muscle spasms.     ibuprofen 100 MG/5ML suspension  Commonly known as:  CHILDRENS IBUPROFEN  Take 30 mLs (600 mg total) by mouth every 6 (six) hours as needed.     metroNIDAZOLE 500 MG tablet  Commonly known as:  FLAGYL  Take 1 tablet (500 mg total) by mouth 2 (two) times daily. One tab PO bid x 10 days     PRENATAL COMPLETE 14-0.4 MG Tabs  Take 1 tablet by mouth daily.     traMADol 50 MG tablet  Commonly known as:  ULTRAM  Take 1 tablet (50 mg total) by mouth every 6 (six) hours as needed.        Encouraged to return here or to other Urgent Care/ED if she develops worsening of symptoms, increase in pain, fever, or other concerning symptoms.      Wynelle Bourgeois CNM, MSN Certified Nurse-Midwife 02/08/2016  11:35 AM

## 2016-02-08 NOTE — MAU Note (Signed)
Been having pain in lower abd and low back, 4 days now.

## 2016-02-08 NOTE — Discharge Instructions (Signed)

## 2016-06-30 ENCOUNTER — Encounter (HOSPITAL_COMMUNITY): Payer: Self-pay | Admitting: *Deleted

## 2016-06-30 ENCOUNTER — Inpatient Hospital Stay (HOSPITAL_COMMUNITY)
Admission: AD | Admit: 2016-06-30 | Discharge: 2016-06-30 | Disposition: A | Discharge status: Home / Self Care | Payer: Medicaid Other | Source: Ambulatory Visit | Attending: Obstetrics and Gynecology | Admitting: Obstetrics and Gynecology

## 2016-06-30 DIAGNOSIS — Z8249 Family history of ischemic heart disease and other diseases of the circulatory system: Secondary | ICD-10-CM | POA: Insufficient documentation

## 2016-06-30 DIAGNOSIS — B9689 Other specified bacterial agents as the cause of diseases classified elsewhere: Secondary | ICD-10-CM

## 2016-06-30 DIAGNOSIS — R109 Unspecified abdominal pain: Secondary | ICD-10-CM | POA: Diagnosis present

## 2016-06-30 DIAGNOSIS — M549 Dorsalgia, unspecified: Secondary | ICD-10-CM | POA: Insufficient documentation

## 2016-06-30 DIAGNOSIS — N898 Other specified noninflammatory disorders of vagina: Secondary | ICD-10-CM | POA: Insufficient documentation

## 2016-06-30 DIAGNOSIS — N760 Acute vaginitis: Secondary | ICD-10-CM | POA: Diagnosis not present

## 2016-06-30 DIAGNOSIS — A499 Bacterial infection, unspecified: Secondary | ICD-10-CM

## 2016-06-30 LAB — URINALYSIS, ROUTINE W REFLEX MICROSCOPIC
Bilirubin Urine: NEGATIVE
GLUCOSE, UA: NEGATIVE mg/dL
Hgb urine dipstick: NEGATIVE
KETONES UR: NEGATIVE mg/dL
LEUKOCYTES UA: NEGATIVE
Nitrite: NEGATIVE
PROTEIN: NEGATIVE mg/dL
Specific Gravity, Urine: 1.02 (ref 1.005–1.030)
pH: 6 (ref 5.0–8.0)

## 2016-06-30 LAB — POCT PREGNANCY, URINE: PREG TEST UR: NEGATIVE

## 2016-06-30 LAB — WET PREP, GENITAL
Sperm: NONE SEEN
TRICH WET PREP: NONE SEEN
Yeast Wet Prep HPF POC: NONE SEEN

## 2016-06-30 MED ORDER — METRONIDAZOLE 0.75 % VA GEL: 1.0000 | Freq: Two times a day (BID) | VAGINAL | Status: DC

## 2016-06-30 NOTE — Discharge Instructions (Signed)

## 2016-06-30 NOTE — MAU Provider Note (Signed)
History   20 yo BF not pregnant in with low back for three days. Unprotected sex 5 days ago. Also c/o vag discharge. States has not taken any OTC med for back pain.  CSN: [id number]  Arrival date & time 06/30/16  1707   First Provider Initiated Contact with Patient 06/30/16 1735      Chief Complaint  Patient presents with  . Vaginal Discharge  . Back Pain  . Abdominal Pain    HPI  Past Medical History  Diagnosis Date  . Asthma     years ago    Past Surgical History  Procedure Laterality Date  . No past surgeries      Family History  Problem Relation Age of Onset  . Asthma Mother   . Hypertension Mother     Social History  Substance Use Topics  . Smoking status: Never Smoker   . Smokeless tobacco: Never Used  . Alcohol Use: No    OB History as of 06/29/16    Gravida Para Term Preterm AB TAB SAB Ectopic Multiple Living   2 1 1  0 0 0 0 0 0 1      Review of Systems  Constitutional: Negative.   HENT: Negative.   Eyes: Negative.   Respiratory: Negative.   Cardiovascular: Negative.   Gastrointestinal: Negative.   Endocrine: Negative.   Genitourinary: Positive for vaginal discharge.  Musculoskeletal: Positive for back pain.  Skin: Negative.   Allergic/Immunologic: Negative.   Neurological: Negative.   Hematological: Negative.   Psychiatric/Behavioral: Negative.     Allergies  Review of patient's allergies indicates no known allergies.  Home Medications  No current outpatient prescriptions on file.  BP 126/79 mmHg  Pulse 68  Temp(Src) 98.3 F (36.8 C) (Oral)  Resp 18  LMP 06/22/2016  Breastfeeding? Unknown  Physical Exam  Constitutional: She is oriented to person, place, and time. She appears well-developed and well-nourished.  HENT:  Head: Normocephalic.  Eyes: Pupils are equal, round, and reactive to light.  Neck: Normal range of motion.  Cardiovascular: Normal rate, regular rhythm, normal heart sounds and intact distal pulses.    Pulmonary/Chest: Effort normal and breath sounds normal.  Abdominal: Soft. Bowel sounds are normal.  Genitourinary: Vagina normal and uterus normal.  Musculoskeletal: Normal range of motion.  Neurological: She is alert and oriented to person, place, and time. She has normal reflexes.  Skin: Skin is warm and dry.  Psychiatric: She has a normal mood and affect. Her behavior is normal. Judgment and thought content normal.    MAU Course  Procedures (including critical care time)  Labs Reviewed  WET PREP, GENITAL - Abnormal; Notable for the following:    Clue Cells Wet Prep HPF POC PRESENT (*)    WBC, Wet Prep HPF POC FEW (*)    All other components within normal limits  URINALYSIS, ROUTINE W REFLEX MICROSCOPIC (NOT [facility])  POCT PREGNANCY, URINE  GC/CHLAMYDIA PROBE AMP ([place]) NOT AT [facility]   No results found.   No diagnosis found.    MDM  Wet prep, chla, GC obtained. Pt in no acute distress. Will d/c home

## 2016-06-30 NOTE — MAU Note (Signed)
Pt states she had a UTI a few weeks ago and she took the medication and felt better.  Pt states the top of her bottom to the middle of the back ache all the time.  Pt states her stomach is cramping.  Pt states that if she sleeping on one side too long it gets numb.  Pt states that "my boobs started tingling today."  Pt states "I am having a little discharge also."

## 2016-07-02 LAB — GC/CHLAMYDIA PROBE AMP (~~LOC~~) NOT AT ARMC
Chlamydia: NEGATIVE
NEISSERIA GONORRHEA: NEGATIVE

## 2016-07-28 ENCOUNTER — Encounter (HOSPITAL_COMMUNITY): Payer: Self-pay | Admitting: *Deleted

## 2016-07-28 ENCOUNTER — Inpatient Hospital Stay (HOSPITAL_COMMUNITY)
Admission: AD | Admit: 2016-07-28 | Discharge: 2016-07-28 | Disposition: A | Discharge status: Home / Self Care | Payer: Medicaid Other | Source: Ambulatory Visit | Attending: Family Medicine | Admitting: Family Medicine

## 2016-07-28 DIAGNOSIS — N898 Other specified noninflammatory disorders of vagina: Secondary | ICD-10-CM | POA: Diagnosis not present

## 2016-07-28 DIAGNOSIS — Z8249 Family history of ischemic heart disease and other diseases of the circulatory system: Secondary | ICD-10-CM | POA: Diagnosis not present

## 2016-07-28 DIAGNOSIS — L298 Other pruritus: Secondary | ICD-10-CM | POA: Diagnosis not present

## 2016-07-28 DIAGNOSIS — Z825 Family history of asthma and other chronic lower respiratory diseases: Secondary | ICD-10-CM | POA: Diagnosis not present

## 2016-07-28 LAB — URINE MICROSCOPIC-ADD ON

## 2016-07-28 LAB — WET PREP, GENITAL
Clue Cells Wet Prep HPF POC: NONE SEEN
Sperm: NONE SEEN
TRICH WET PREP: NONE SEEN
Yeast Wet Prep HPF POC: NONE SEEN

## 2016-07-28 LAB — URINALYSIS, ROUTINE W REFLEX MICROSCOPIC
Bilirubin Urine: NEGATIVE
GLUCOSE, UA: NEGATIVE mg/dL
KETONES UR: NEGATIVE mg/dL
NITRITE: NEGATIVE
PROTEIN: NEGATIVE mg/dL
Specific Gravity, Urine: 1.02 (ref 1.005–1.030)
pH: 6 (ref 5.0–8.0)

## 2016-07-28 LAB — POCT PREGNANCY, URINE: Preg Test, Ur: NEGATIVE

## 2016-07-28 MED ORDER — TERCONAZOLE 0.4 % VA CREA: 1.0000 | TOPICAL_CREAM | Freq: Every day | VAGINAL | 0 refills | Status: DC

## 2016-07-28 NOTE — Discharge Instructions (Signed)

## 2016-07-28 NOTE — MAU Provider Note (Signed)
Chief Complaint:  No chief complaint on file.   First Provider Initiated Contact with Patient 07/28/16 1158     HPI  Stacey Reid is a 20 y.o. G2P1011 who presents to maternity admissions reporting vaginal itching and burning of labial areas since treatment for BV.  GC/Chl from last visit were both negative. She reports vaginal bleeding, vaginal itching/burning, urinary symptoms, h/a, dizziness, n/v, or fever/chills.    Uses condoms intermittently, no contraception.  RN Note: Pt states that about 3 weeks ago she was diagnosed with BV.  A week later after she finished the medication it started tingling when she peed.  Pt states she started her period last week and started having some watery discharge.  Past Medical History: Past Medical History:  Diagnosis Date  . Asthma    years ago    Past obstetric history: OB History  Gravida Para Term Preterm AB Living  0 1 1  SAB TAB Ectopic Multiple Live Births  1 0 0 0 1    # Outcome Date GA Lbr Len/2nd Weight Sex Delivery Anes PTL Lv  2 Term 04/02/15 [redacted]w[redacted]d 413:16 / 01:08 5 lb 13.5 oz (2.651 kg) M Vag-Spont EPI, Local  LIV  1 SAB               Past Surgical History: Past Surgical History:  Procedure Laterality Date  . NO PAST SURGERIES      Family History: Family History  Problem Relation Age of Onset  . Asthma Mother   . Hypertension Mother     Social History: Social History  Substance Use Topics  . Smoking status: Never Smoker  . Smokeless tobacco: Never Used  . Alcohol use No    Allergies: No Known Allergies  Meds:  Prescriptions Prior to Admission  Medication Sig Dispense Refill Last Dose  . acetaminophen (TYLENOL) 160 MG/5ML liquid Take 325 mg by mouth every 4 (four) hours as needed for pain.   02/07/2016 at Unknown time  . cephALEXin (KEFLEX) 500 MG capsule Take 1 capsule (500 mg total) by mouth 4 (four) times daily. (Patient not taking: Reported on 02/08/2016) 20 capsule 0   . cyclobenzaprine  (FLEXERIL) 10 MG tablet Take 1 tablet (10 mg total) by mouth 3 (three) times daily as needed for muscle spasms. (Patient not taking: Reported on 02/08/2016) 15 tablet 0   . glycopyrrolate (ROBINUL) 1 MG tablet Take 1 tablet (1 mg total) by mouth every 8 (eight) hours as needed. 30 tablet 0   . metroNIDAZOLE (FLAGYL) 500 MG tablet Take 1 tablet (500 mg total) by mouth 2 (two) times daily. One tab PO bid x 10 days (Patient not taking: Reported on 02/08/2016) 20 tablet 0   . metroNIDAZOLE (METROGEL VAGINAL) 0.75 % vaginal gel Place 1 Applicatorful vaginally 2 (two) times daily. 70 g 0   . Prenatal Vit-Fe Fumarate-FA (PRENATAL COMPLETE) 14-0.4 MG TABS Take 1 tablet by mouth daily. 60 each 1 02/06/2016  . promethazine (PHENERGAN) 25 MG tablet Take 1 tablet (25 mg total) by mouth every 6 (six) hours as needed for nausea or vomiting. 30 tablet 2     I have reviewed patient's Past Medical Hx, Surgical Hx, Family Hx, Social Hx, medications and allergies.  ROS:  Review of Systems  Constitutional: Negative for chills and fever.  Gastrointestinal: Negative for abdominal pain, constipation, diarrhea, nausea and vomiting.  Genitourinary: Positive for vaginal discharge and vaginal pain (itching, not pain). Negative for difficulty urinating, dysuria and pelvic  pain.  Musculoskeletal: Negative for back pain.   Other systems negative     Physical Exam  Patient Vitals for the past 24 hrs:  BP Temp Temp src Pulse Resp  07/28/16 1149 124/74 98.9 F (37.2 C) Oral 81 16   Constitutional: Well-developed, well-nourished female in no acute distress.  Cardiovascular: normal rate and rhythm, no ectopy audible, S1 & S2 heard, no murmur Respiratory: normal effort, no distress. Lungs CTAB with no wheezes or crackles GI: Abd soft, non-tender.  Nondistended.  No rebound, No guarding.  Bowel Sounds audible  MS: Extremities nontender, no edema, normal ROM Neurologic: Alert and oriented x 4.   Grossly nonfocal. GU: Neg  CVAT. Skin:  Warm and Dry Psych:  Affect appropriate.  PELVIC EXAM: Cervix pink, visually closed, without lesion, scant white creamy discharge, vaginal walls and external genitalia normal   Not much erethema noted   Labs: No results found for this or any previous visit (from the past 24 hour(s)). --/--/O POS (02/09 3825)  Imaging:  No results found.  MAU Course/MDM: I have ordered labs as follows:  Wet prep Imaging ordered: none Results reviewed.   Pt stable at time of discharge.  Assessment: Vaginal irritation Presumed yeast vaginitis  Plan: Discharge home Recommend Contraception and consistent condom use with spermacide Rx sent for Terazol 7  for yeast vaginitis   Encouraged to return here or to other Urgent Care/ED if she develops worsening of symptoms, increase in pain, fever, or other concerning symptoms.   Wynelle Bourgeois CNM, MSN Certified Nurse-Midwife 07/28/2016 11:59 AM

## 2016-07-28 NOTE — MAU Note (Signed)
Pt states that about 3 weeks ago she was diagnosed with BV.  A week later after she finished the medication it started tingling when she peed.  Pt states she started her period last week and started having some watery discharge.

## 2016-09-27 ENCOUNTER — Inpatient Hospital Stay (HOSPITAL_COMMUNITY)
Admission: AD | Admit: 2016-09-27 | Discharge: 2016-09-28 | Disposition: A | Discharge status: Home / Self Care | Payer: Medicaid Other | Source: Ambulatory Visit | Attending: Obstetrics & Gynecology | Admitting: Obstetrics & Gynecology

## 2016-09-27 ENCOUNTER — Inpatient Hospital Stay (HOSPITAL_COMMUNITY)
Admission: AD | Admit: 2016-09-27 | Discharge: 2016-09-27 | Disposition: A | Discharge status: Home / Self Care | Payer: Medicaid Other | Source: Ambulatory Visit | Attending: Family Medicine | Admitting: Family Medicine

## 2016-09-27 DIAGNOSIS — A599 Trichomoniasis, unspecified: Secondary | ICD-10-CM | POA: Insufficient documentation

## 2016-09-27 DIAGNOSIS — J45909 Unspecified asthma, uncomplicated: Secondary | ICD-10-CM | POA: Insufficient documentation

## 2016-09-27 DIAGNOSIS — Z79899 Other long term (current) drug therapy: Secondary | ICD-10-CM | POA: Insufficient documentation

## 2016-09-27 DIAGNOSIS — N390 Urinary tract infection, site not specified: Secondary | ICD-10-CM | POA: Insufficient documentation

## 2016-09-27 NOTE — MAU Note (Signed)
Pt not in lobby.  

## 2016-09-27 NOTE — MAU Note (Signed)
Pt not in lobby x2 

## 2016-09-27 NOTE — MAU Note (Signed)
Not in Lobby x1 

## 2016-09-28 ENCOUNTER — Encounter (HOSPITAL_COMMUNITY): Payer: Self-pay

## 2016-09-28 DIAGNOSIS — N390 Urinary tract infection, site not specified: Secondary | ICD-10-CM | POA: Diagnosis not present

## 2016-09-28 DIAGNOSIS — A599 Trichomoniasis, unspecified: Secondary | ICD-10-CM | POA: Diagnosis present

## 2016-09-28 DIAGNOSIS — Z79899 Other long term (current) drug therapy: Secondary | ICD-10-CM | POA: Diagnosis not present

## 2016-09-28 DIAGNOSIS — J45909 Unspecified asthma, uncomplicated: Secondary | ICD-10-CM | POA: Diagnosis not present

## 2016-09-28 LAB — CBC
HCT: 34.9 % — ABNORMAL LOW (ref 36.0–46.0)
HEMOGLOBIN: 12.5 g/dL (ref 12.0–15.0)
MCH: 31.3 pg (ref 26.0–34.0)
MCHC: 35.8 g/dL (ref 30.0–36.0)
MCV: 87.3 fL (ref 78.0–100.0)
Platelets: 345 10*3/uL (ref 150–400)
RBC: 4 MIL/uL (ref 3.87–5.11)
RDW: 11.4 % — ABNORMAL LOW (ref 11.5–15.5)
WBC: 6.9 10*3/uL (ref 4.0–10.5)

## 2016-09-28 LAB — WET PREP, GENITAL
Sperm: NONE SEEN
Yeast Wet Prep HPF POC: NONE SEEN

## 2016-09-28 LAB — URINALYSIS, ROUTINE W REFLEX MICROSCOPIC
Bilirubin Urine: NEGATIVE
Glucose, UA: NEGATIVE mg/dL
Hgb urine dipstick: NEGATIVE
Ketones, ur: NEGATIVE mg/dL
NITRITE: POSITIVE — AB
PH: 6 (ref 5.0–8.0)
Protein, ur: NEGATIVE mg/dL
SPECIFIC GRAVITY, URINE: 1.02 (ref 1.005–1.030)

## 2016-09-28 LAB — HIV ANTIBODY (ROUTINE TESTING W REFLEX): HIV SCREEN 4TH GENERATION: NONREACTIVE

## 2016-09-28 LAB — HCG, QUANTITATIVE, PREGNANCY: HCG, BETA CHAIN, QUANT, S: 54 m[IU]/mL — AB (ref <5)

## 2016-09-28 LAB — URINE MICROSCOPIC-ADD ON

## 2016-09-28 LAB — RPR: RPR Ser Ql: NONREACTIVE

## 2016-09-28 LAB — POCT PREGNANCY, URINE: PREG TEST UR: NEGATIVE

## 2016-09-28 MED ORDER — CEPHALEXIN 250 MG/5ML PO SUSR: 500.0000 mg | Freq: Four times a day (QID) | ORAL | 0 refills | Status: AC

## 2016-09-28 MED ORDER — NITROFURANTOIN MONOHYD MACRO 100 MG PO CAPS: 100.0000 mg | ORAL_CAPSULE | Freq: Two times a day (BID) | ORAL | 0 refills | Status: DC

## 2016-09-28 MED ORDER — METRONIDAZOLE IVPB CUSTOM
2000.0000 mg | Freq: Once | INTRAVENOUS | Status: AC
  Administered 2016-09-28: 2000 mg via INTRAVENOUS
  Filled 2016-09-28: qty 400

## 2016-09-28 MED ORDER — METRONIDAZOLE 500 MG PO TABS
2000.0000 mg | ORAL_TABLET | Freq: Once | ORAL | Status: AC
  Administered 2016-09-28: 2000 mg via ORAL
  Filled 2016-09-28: qty 4

## 2016-09-28 NOTE — Discharge Instructions (Signed)
Trichomoniasis °Trichomoniasis is an infection caused by an organism called Trichomonas. The infection can affect both women and men. In women, the outer female genitalia and the vagina are affected. In men, the penis is mainly affected, but the prostate and other reproductive organs can also be involved. Trichomoniasis is a sexually transmitted infection (STI) and is most often passed to another person through sexual contact.  °RISK FACTORS °· Having unprotected sexual intercourse. °· Having sexual intercourse with an infected partner. °SIGNS AND SYMPTOMS  °Symptoms of trichomoniasis in women include: °· Abnormal gray-green frothy vaginal discharge. °· Itching and irritation of the vagina. °· Itching and irritation of the area outside the vagina. °Symptoms of trichomoniasis in men include:  °· Penile discharge with or without pain. °· Pain during urination. This results from inflammation of the urethra. °DIAGNOSIS  °Trichomoniasis may be found during a Pap test or physical exam. Your health care provider may use one of the following methods to help diagnose this infection: °· Testing the pH of the vagina with a test tape. °· Using a vaginal swab test that checks for the Trichomonas organism. A test is available that provides results within a few minutes. °· Examining a urine sample. °· Testing vaginal secretions. °Your health care provider may test you for other STIs, including HIV. °TREATMENT  °· You may be given medicine to fight the infection. Women should inform their health care provider if they could be or are pregnant. Some medicines used to treat the infection should not be taken during pregnancy. °· Your health care provider may recommend over-the-counter medicines or creams to decrease itching or irritation. °· Your sexual partner will need to be treated if infected. °· Your health care provider may test you for infection again 3 months after treatment. °HOME CARE INSTRUCTIONS  °· Take medicines only as  directed by your health care provider. °· Take over-the-counter medicine for itching or irritation as directed by your health care provider. °· Do not have sexual intercourse while you have the infection. °· Women should not douche or wear tampons while they have the infection. °· Discuss your infection with your partner. Your partner may have gotten the infection from you, or you may have gotten it from your partner. °· Have your sex partner get examined and treated if necessary. °· Practice safe, informed, and protected sex. °· See your health care provider for other STI testing. °SEEK MEDICAL CARE IF:  °· You still have symptoms after you finish your medicine. °· You develop abdominal pain. °· You have pain when you urinate. °· You have bleeding after sexual intercourse. °· You develop a rash. °· Your medicine makes you sick or makes you throw up (vomit). °MAKE SURE YOU: °· Understand these instructions. °· Will watch your condition. °· Will get help right away if you are not doing well or get worse. °  °This information is not intended to replace advice given to you by your health care provider. Make sure you discuss any questions you have with your health care provider. °  °Document Released: 06/05/2001 Document Revised: 12/31/2014 Document Reviewed: 09/21/2013 °Elsevier Interactive Patient Education ©2016 [employer] °Urinary Tract Infection °Urinary tract infections (UTIs) can develop anywhere along your urinary tract. Your urinary tract is your body's drainage system for removing wastes and extra water. Your urinary tract includes two kidneys, two ureters, a bladder, and a urethra. Your kidneys are a pair of bean-shaped organs. Each kidney is about the size of your fist. They are   located below your ribs, one on each side of your spine. °CAUSES °Infections are caused by microbes, which are microscopic organisms, including fungi, viruses, and bacteria. These organisms are so small that they can only be seen  through a microscope. Bacteria are the microbes that most commonly cause UTIs. °SYMPTOMS  °Symptoms of UTIs may vary by age and gender of the patient and by the location of the infection. Symptoms in young women typically include a frequent and intense urge to urinate and a painful, burning feeling in the bladder or urethra during urination. Older women and men are more likely to be tired, shaky, and weak and have muscle aches and abdominal pain. A fever may mean the infection is in your kidneys. Other symptoms of a kidney infection include pain in your back or sides below the ribs, nausea, and vomiting. °DIAGNOSIS °To diagnose a UTI, your caregiver will ask you about your symptoms. Your caregiver will also ask you to provide a urine sample. The urine sample will be tested for bacteria and white blood cells. White blood cells are made by your body to help fight infection. °TREATMENT  °Typically, UTIs can be treated with medication. Because most UTIs are caused by a bacterial infection, they usually can be treated with the use of antibiotics. The choice of antibiotic and length of treatment depend on your symptoms and the type of bacteria causing your infection. °HOME CARE INSTRUCTIONS °· If you were prescribed antibiotics, take them exactly as your caregiver instructs you. Finish the medication even if you feel better after you have only taken some of the medication. °· Drink enough water and fluids to keep your urine clear or pale yellow. °· Avoid caffeine, tea, and carbonated beverages. They tend to irritate your bladder. °· Empty your bladder often. Avoid holding urine for long periods of time. °· Empty your bladder before and after sexual intercourse. °· After a bowel movement, women should cleanse from front to back. Use each tissue only once. °SEEK MEDICAL CARE IF:  °· You have back pain. °· You develop a fever. °· Your symptoms do not begin to resolve within 3 days. °SEEK IMMEDIATE MEDICAL CARE IF:  °· You  have severe back pain or lower abdominal pain. °· You develop chills. °· You have nausea or vomiting. °· You have continued burning or discomfort with urination. °MAKE SURE YOU:  °· Understand these instructions. °· Will watch your condition. °· Will get help right away if you are not doing well or get worse. °  °This information is not intended to replace advice given to you by your health care provider. Make sure you discuss any questions you have with your health care provider. °  °Document Released: 09/19/2005 Document Revised: 08/31/2015 Document Reviewed: 01/18/2012 °Elsevier Interactive Patient Education ©2016 [employer] Inc[employer] ° °

## 2016-09-28 NOTE — MAU Note (Signed)
Pt started having vaginal discharge that itches that she noticed about 1 week ago and some lower abdominal cramping about the same time. Denies vag bleeding. +upt tonight at home. LMP: 09/05/2016

## 2016-09-28 NOTE — MAU Note (Signed)
Pt unable to tolerate PO flagyl even after mixing with chocolate pudding and applesauce. Zorita PangH Hogan CNM aware.

## 2016-09-28 NOTE — MAU Provider Note (Signed)
History     CSN: [id number]  Arrival date and time: 09/27/16 2216   First Provider Initiated Contact with Patient 09/28/16 0035      Chief Complaint  Patient presents with  . Vaginal Discharge  . Possible Pregnancy   Stacey Reid is a 20 y.o. G2P1011 who presents today with multiple complaints including breast tenderness, fatigue, vaginal discharge and cramps. She states that she took a pregnancy test yesterday at home and it was positive.    Vaginal Discharge  The patient's primary symptoms include pelvic pain and vaginal discharge. This is a new problem. The current episode started yesterday. The problem occurs constantly. The problem has been unchanged. Pain severity now: 3/10  The problem affects the right side. She is not pregnant. Associated symptoms include abdominal pain and nausea. Pertinent negatives include no chills, constipation, diarrhea, dysuria, fever, frequency, urgency or vomiting. The vaginal discharge was copious and white. There has been no bleeding. Nothing aggravates the symptoms. She has tried nothing for the symptoms. Her menstrual history has been regular (LMP 09/05/16 ).   Past Medical History:  Diagnosis Date  . Asthma    years ago    Past Surgical History:  Procedure Laterality Date  . NO PAST SURGERIES      Family History  Problem Relation Age of Onset  . Asthma Mother   . Hypertension Mother     Social History  Substance Use Topics  . Smoking status: Never Smoker  . Smokeless tobacco: Never Used  . Alcohol use No    Allergies: No Known Allergies  Prescriptions Prior to Admission  Medication Sig Dispense Refill Last Dose  . cephALEXin (KEFLEX) 500 MG capsule Take 1 capsule (500 mg total) by mouth 4 (four) times daily. 20 capsule 0   . glycopyrrolate (ROBINUL) 1 MG tablet Take 1 tablet (1 mg total) by mouth every 8 (eight) hours as needed. 30 tablet 0 Not Taking at Unknown time  . metroNIDAZOLE (FLAGYL) 500 MG tablet Take 1  tablet (500 mg total) by mouth 2 (two) times daily. One tab PO bid x 10 days 20 tablet 0 Not Taking at Unknown time  . metroNIDAZOLE (METROGEL VAGINAL) 0.75 % vaginal gel Place 1 Applicatorful vaginally 2 (two) times daily. 70 g 0 Not Taking at Unknown time  . Prenatal Vit-Fe Fumarate-FA (PRENATAL COMPLETE) 14-0.4 MG TABS Take 1 tablet by mouth daily. 60 each 1 Not Taking at Unknown time  . promethazine (PHENERGAN) 25 MG tablet Take 1 tablet (25 mg total) by mouth every 6 (six) hours as needed for nausea or vomiting. 30 tablet 2 Not Taking at Unknown time  . terconazole (TERAZOL 7) 0.4 % vaginal cream Place 1 applicator vaginally at bedtime. 45 g 0     Review of Systems  Constitutional: Negative for chills and fever.  Gastrointestinal: Positive for abdominal pain and nausea. Negative for constipation, diarrhea and vomiting.  Genitourinary: Positive for pelvic pain and vaginal discharge. Negative for dysuria, frequency and urgency.   Physical Exam   Blood pressure 126/82, pulse 83, temperature 98.3 F (36.8 C), temperature source Oral, resp. rate 16, last menstrual period 09/05/2016, SpO2 100 %, unknown if currently breastfeeding.  Physical Exam  Nursing note and vitals reviewed. Constitutional: She is oriented to person, place, and time. She appears well-developed and well-nourished. No distress.  HENT:  Head: Normocephalic.  Cardiovascular: Normal rate.   Respiratory: Effort normal.  GI: Soft. There is no tenderness. There is no rebound.  Neurological: She is  alert and oriented to person, place, and time.  Skin: Skin is warm and dry.  Psychiatric: She has a normal mood and affect.   Results for orders placed or performed during the hospital encounter of 09/27/16 (from the past 24 hour(s))  Urinalysis, Routine w reflex microscopic (not at [facility])     Status: Abnormal   Collection Time: 09/28/16 12:04 AM  Result Value Ref Range   Color, Urine YELLOW YELLOW   APPearance CLEAR CLEAR    Specific Gravity, Urine 1.020 1.005 - 1.030   pH 6.0 5.0 - 8.0   Glucose, UA NEGATIVE NEGATIVE mg/dL   Hgb urine dipstick NEGATIVE NEGATIVE   Bilirubin Urine NEGATIVE NEGATIVE   Ketones, ur NEGATIVE NEGATIVE mg/dL   Protein, ur NEGATIVE NEGATIVE mg/dL   Nitrite POSITIVE (A) NEGATIVE   Leukocytes, UA MODERATE (A) NEGATIVE  Urine microscopic-add on     Status: Abnormal   Collection Time: 09/28/16 12:04 AM  Result Value Ref Range   Squamous Epithelial / LPF 0-5 (A) NONE SEEN   WBC, UA 6-30 0 - 5 WBC/hpf   RBC / HPF 0-5 0 - 5 RBC/hpf   Bacteria, UA FEW (A) NONE SEEN  Pregnancy, urine POC     Status: None   Collection Time: 09/28/16 12:21 AM  Result Value Ref Range   Preg Test, Ur NEGATIVE NEGATIVE  Wet prep, genital     Status: Abnormal   Collection Time: 09/28/16 12:45 AM  Result Value Ref Range   Yeast Wet Prep HPF POC NONE SEEN NONE SEEN   Trich, Wet Prep PRESENT (A) NONE SEEN   Clue Cells Wet Prep HPF POC PRESENT (A) NONE SEEN   WBC, Wet Prep HPF POC MODERATE (A) NONE SEEN   Sperm NONE SEEN   CBC     Status: Abnormal   Collection Time: 09/28/16 12:52 AM  Result Value Ref Range   WBC 6.9 4.0 - 10.5 K/uL   RBC 4.00 3.87 - 5.11 MIL/uL   Hemoglobin 12.5 12.0 - 15.0 g/dL   HCT 16.1 (L) 09.6 - 04.5 %   MCV 87.3 78.0 - 100.0 fL   MCH 31.3 26.0 - 34.0 pg   MCHC 35.8 30.0 - 36.0 g/dL   RDW 40.9 (L) 81.1 - 91.4 %   Platelets 345 150 - 400 K/uL  hCG, quantitative, pregnancy     Status: Abnormal   Collection Time: 09/28/16 12:52 AM  Result Value Ref Range   hCG, Beta Chain, Quant, S 54 (H) <5 mIU/mL    MAU Course  Procedures  MDM   Patient treated with 2g flagyl here in MAU Patient with UTI and trichomoniasis. Will treat those and FU on Sunday for HCG. Likely pain is related to infection at this time. If persists and HCG does not rise as expected will get [place] on Sunday. Ectopic precautions reviewed   Patient is not able to take PO flagyl. She cannot swallow pills and we have  tried mixing it with chocolate, and she has vomited all the doses she has been given. Will give IV dose   Assessment and Plan   1. Trichomoniasis   2. Urinary tract infection without hematuria, site unspecified    DC home Comfort measures reviewed  1st Trimester precautions  Bleeding precautions Ectopic precautions RX: Kelfex 500mg  QID x 7 days   Return to MAU as needed   Follow-up Information    [facility] [place] [facility] .   Why:  Return Sunday morning  around 0800 for repeat blood work  Contact information: [address] [id number] mc [place] [address] [phone]           Tawnya Crook 09/28/2016, 12:36 AM

## 2016-09-30 ENCOUNTER — Inpatient Hospital Stay (HOSPITAL_COMMUNITY)
Admission: AD | Admit: 2016-09-30 | Discharge: 2016-09-30 | Disposition: A | Discharge status: Home / Self Care | Payer: Medicaid Other | Source: Ambulatory Visit | Attending: Obstetrics & Gynecology | Admitting: Obstetrics & Gynecology

## 2016-09-30 ENCOUNTER — Encounter (HOSPITAL_COMMUNITY): Payer: Self-pay | Admitting: *Deleted

## 2016-09-30 DIAGNOSIS — O26891 Other specified pregnancy related conditions, first trimester: Secondary | ICD-10-CM | POA: Diagnosis present

## 2016-09-30 DIAGNOSIS — O0281 Inappropriate change in quantitative human chorionic gonadotropin (hCG) in early pregnancy: Secondary | ICD-10-CM | POA: Diagnosis not present

## 2016-09-30 DIAGNOSIS — O3680X0 Pregnancy with inconclusive fetal viability, not applicable or unspecified: Secondary | ICD-10-CM

## 2016-09-30 LAB — HCG, QUANTITATIVE, PREGNANCY: hCG, Beta Chain, Quant, S: 224 m[IU]/mL — ABNORMAL HIGH (ref <5)

## 2016-09-30 NOTE — Discharge Instructions (Signed)
Pelvic Rest °Pelvic rest is sometimes recommended for women when:  °· The placenta is partially or completely covering the opening of the cervix (placenta previa). °· There is bleeding between the uterine wall and the amniotic sac in the first trimester (subchorionic hemorrhage). °· The cervix begins to open without labor starting (incompetent cervix, cervical insufficiency). °· The labor is too early (preterm labor). °HOME CARE INSTRUCTIONS °· Do not have sexual intercourse, stimulation, or an orgasm. °· Do not use tampons, douche, or put anything in the vagina. °· Do not lift anything over 10 pounds (4.5 kg). °· Avoid strenuous activity or straining your pelvic muscles. °SEEK MEDICAL CARE IF:  °· You have any vaginal bleeding during pregnancy. Treat this as a potential emergency. °· You have cramping pain felt low in the stomach (stronger than menstrual cramps). °· You notice vaginal discharge (watery, mucus, or bloody). °· You have a low, dull backache. °· There are regular contractions or uterine tightening. °SEEK IMMEDIATE MEDICAL CARE IF: °You have vaginal bleeding and have placenta previa.  °  °This information is not intended to replace advice given to you by your health care provider. Make sure you discuss any questions you have with your health care provider. °  °Document Released: 04/06/2011 Document Revised: 03/03/2012 Document Reviewed: 06/13/2015 °Elsevier Interactive Patient Education ©2016 [employer] ° °

## 2016-09-30 NOTE — MAU Provider Note (Signed)
S:  [name] is a 20 y.o. female here in MAU for a follow up beta hcg level. She was seen in MAU on 10/6 for fatigue and abdominal cramping. She had a beta hcg level drawn which was 54. Currently she is without complaints, she denies abdominal pain or vaginal bleeding.    O:  GENERAL: Well-developed, well-nourished female in no acute distress.  LUNGS: Effort normal SKIN: Warm, dry and without erythema PSYCH: Normal mood and affect  Vitals:   09/30/16 0833  BP: 114/66  Pulse: 71  Resp: 18  Temp: 98.6 F (37 C)    MDM Beta hcg level 10/6: 54 Beta hcg level 10/8: 224  A:  1. Pregnancy of unknown anatomic location   2. Elevated level of quantitative hCG for gestational age in early pregnancy     P:  Discharge home in stable condition Follow up US in 7 days for viability Return to MAU as needed, if symptoms worsen Pelvic rest Ectopic precautions   Duane LopeJennifer I Rasch, NP 09/30/2016 11:13 AM

## 2016-09-30 NOTE — MAU Note (Signed)
Pt presents to MAU for repeat BHCG. Pt denies any vaginal bleeding or pain

## 2016-10-01 LAB — GC/CHLAMYDIA PROBE AMP (~~LOC~~) NOT AT ARMC
CHLAMYDIA, DNA PROBE: NEGATIVE
NEISSERIA GONORRHEA: NEGATIVE

## 2016-10-08 ENCOUNTER — Other Ambulatory Visit (HOSPITAL_COMMUNITY): Payer: Self-pay | Admitting: Obstetrics and Gynecology

## 2016-10-08 ENCOUNTER — Ambulatory Visit (HOSPITAL_COMMUNITY)
Admission: RE | Admit: 2016-10-08 | Discharge: 2016-10-08 | Disposition: A | Discharge status: Home / Self Care | Payer: Medicaid Other | Source: Ambulatory Visit | Attending: Obstetrics and Gynecology | Admitting: Obstetrics and Gynecology

## 2016-10-08 ENCOUNTER — Ambulatory Visit: Payer: Medicaid Other | Admitting: *Deleted

## 2016-10-08 DIAGNOSIS — Z362 Encounter for other antenatal screening follow-up: Secondary | ICD-10-CM | POA: Insufficient documentation

## 2016-10-08 DIAGNOSIS — Z3A01 Less than 8 weeks gestation of pregnancy: Secondary | ICD-10-CM

## 2016-10-08 DIAGNOSIS — O3680X0 Pregnancy with inconclusive fetal viability, not applicable or unspecified: Secondary | ICD-10-CM

## 2016-10-08 NOTE — Progress Notes (Signed)
Patient presents to clinic for u/s results. Results reviewed with Dr Erin FullingHarraway Smith. She said patient should start prenatal care and return in 2 weeks for f/u u/s. Info given to patient, u/s scheduled. Understanding was voiced.

## 2016-10-14 ENCOUNTER — Encounter (HOSPITAL_COMMUNITY): Payer: Self-pay | Admitting: *Deleted

## 2016-10-14 ENCOUNTER — Inpatient Hospital Stay (HOSPITAL_COMMUNITY)
Admission: AD | Admit: 2016-10-14 | Discharge: 2016-10-14 | Disposition: A | Discharge status: Home / Self Care | Payer: Medicaid Other | Source: Ambulatory Visit | Attending: Obstetrics and Gynecology | Admitting: Obstetrics and Gynecology

## 2016-10-14 DIAGNOSIS — O210 Mild hyperemesis gravidarum: Secondary | ICD-10-CM

## 2016-10-14 DIAGNOSIS — O99611 Diseases of the digestive system complicating pregnancy, first trimester: Secondary | ICD-10-CM | POA: Insufficient documentation

## 2016-10-14 DIAGNOSIS — Z3491 Encounter for supervision of normal pregnancy, unspecified, first trimester: Secondary | ICD-10-CM

## 2016-10-14 DIAGNOSIS — Z3A01 Less than 8 weeks gestation of pregnancy: Secondary | ICD-10-CM | POA: Diagnosis not present

## 2016-10-14 DIAGNOSIS — K117 Disturbances of salivary secretion: Secondary | ICD-10-CM | POA: Diagnosis not present

## 2016-10-14 LAB — URINALYSIS, ROUTINE W REFLEX MICROSCOPIC
Bilirubin Urine: NEGATIVE
GLUCOSE, UA: NEGATIVE mg/dL
Hgb urine dipstick: NEGATIVE
KETONES UR: NEGATIVE mg/dL
LEUKOCYTES UA: NEGATIVE
NITRITE: NEGATIVE
PROTEIN: NEGATIVE mg/dL
Specific Gravity, Urine: 1.01 (ref 1.005–1.030)
pH: 7.5 (ref 5.0–8.0)

## 2016-10-14 LAB — COMPREHENSIVE METABOLIC PANEL
ALT: 12 U/L — ABNORMAL LOW (ref 14–54)
AST: 16 U/L (ref 15–41)
Albumin: 4.2 g/dL (ref 3.5–5.0)
Alkaline Phosphatase: 38 U/L (ref 38–126)
Anion gap: 5 (ref 5–15)
BUN: 8 mg/dL (ref 6–20)
CO2: 24 mmol/L (ref 22–32)
Calcium: 8.9 mg/dL (ref 8.9–10.3)
Chloride: 107 mmol/L (ref 101–111)
Creatinine, Ser: 0.73 mg/dL (ref 0.44–1.00)
GFR calc Af Amer: >60 mL/min (ref >60)
GFR calc non Af Amer: >60 mL/min (ref >60)
Glucose, Bld: 90 mg/dL (ref 65–99)
Potassium: 4.2 mmol/L (ref 3.5–5.1)
Sodium: 136 mmol/L (ref 135–145)
Total Bilirubin: 0.9 mg/dL (ref 0.3–1.2)
Total Protein: 7.5 g/dL (ref 6.5–8.1)

## 2016-10-14 LAB — CBC
HEMATOCRIT: 34.6 % — AB (ref 36.0–46.0)
HEMOGLOBIN: 12.4 g/dL (ref 12.0–15.0)
MCH: 30.9 pg (ref 26.0–34.0)
MCHC: 35.8 g/dL (ref 30.0–36.0)
MCV: 86.3 fL (ref 78.0–100.0)
Platelets: 344 10*3/uL (ref 150–400)
RBC: 4.01 MIL/uL (ref 3.87–5.11)
RDW: 11.8 % (ref 11.5–15.5)
WBC: 4.9 10*3/uL (ref 4.0–10.5)

## 2016-10-14 MED ORDER — GLYCOPYRROLATE 1 MG PO TABS: 1.0000 mg | ORAL_TABLET | Freq: Three times a day (TID) | ORAL | 0 refills | Status: DC

## 2016-10-14 MED ORDER — PROMETHAZINE HCL 25 MG PO TABS: 25.0000 mg | ORAL_TABLET | Freq: Four times a day (QID) | ORAL | 0 refills | Status: DC | PRN

## 2016-10-14 MED ORDER — PROMETHAZINE HCL 25 MG RE SUPP: 25.0000 mg | Freq: Four times a day (QID) | RECTAL | 1 refills | Status: DC | PRN

## 2016-10-14 NOTE — MAU Note (Signed)
Pt C/O vomiting since yesterday, unable to hold anything down.  Has abdominal pain after vomiting.  Denies bleeding.

## 2016-10-14 NOTE — Discharge Instructions (Signed)

## 2016-10-14 NOTE — MAU Provider Note (Signed)
History     CSN: [id number]  Arrival date and time: 10/14/16 1035   First Provider Initiated Contact with Patient 10/14/16 1102      Chief Complaint  Patient presents with  . Emesis During Pregnancy   G3P1011 @[redacted]w[redacted]d  by LMP here with N/V since yesterday am. She reports 4 episodes yesterday and 1 today. She also c/o excessive salivation and spitting all day long. No fevers. Denies sick contacts. She denies VB and abd pain. She had similar sx in previous pregnancy and only responded to Phenergan and would like Rx. She is planning prenatal care at [facility] and has appt scheduled.    OB History    Gravida Para Term Preterm AB Living   3 1 1  0 1 1   SAB TAB Ectopic Multiple Live Births   1 0 0 0 1      Past Medical History:  Diagnosis Date  . Asthma    years ago    Past Surgical History:  Procedure Laterality Date  . WISDOM TOOTH EXTRACTION      Family History  Problem Relation Age of Onset  . Asthma Mother   . Hypertension Mother     Social History  Substance Use Topics  . Smoking status: Never Smoker  . Smokeless tobacco: Never Used  . Alcohol use No    Allergies: No Known Allergies  Prescriptions Prior to Admission  Medication Sig Dispense Refill Last Dose  . Prenatal Vit-Fe Fumarate-FA (PRENATAL COMPLETE) 14-0.4 MG TABS Take 1 tablet by mouth daily. 60 each 1 Not Taking at Unknown time    Review of Systems  Constitutional: Negative.   Gastrointestinal: Positive for constipation, heartburn, nausea and vomiting. Negative for abdominal pain and diarrhea.  Genitourinary: Negative.    Physical Exam   Blood pressure 110/67, pulse 83, temperature 98.3 F (36.8 C), temperature source Oral, resp. rate 16, height 5\' 3"  (1.6 m), weight 60.8 kg (134 lb), last menstrual period 09/05/2016, unknown if currently breastfeeding.  Physical Exam  Constitutional: She is oriented to person, place, and time. She appears well-developed and well-nourished. No distress.  HENT:   Head: Normocephalic and atraumatic.  Neck: Normal range of motion. Neck supple.  Cardiovascular: Normal rate.   Respiratory: Effort normal.  Musculoskeletal: Normal range of motion.  Neurological: She is alert and oriented to person, place, and time.  Skin: Skin is dry.  Psychiatric: She has a normal mood and affect.   Results for orders placed or performed during the hospital encounter of 10/14/16 (from the past 24 hour(s))  Urinalysis, Routine w reflex microscopic (not at [facility])     Status: None   Collection Time: 10/14/16 10:40 AM  Result Value Ref Range   Color, Urine YELLOW YELLOW   APPearance CLEAR CLEAR   Specific Gravity, Urine 1.010 1.005 - 1.030   pH 7.5 5.0 - 8.0   Glucose, UA NEGATIVE NEGATIVE mg/dL   Hgb urine dipstick NEGATIVE NEGATIVE   Bilirubin Urine NEGATIVE NEGATIVE   Ketones, ur NEGATIVE NEGATIVE mg/dL   Protein, ur NEGATIVE NEGATIVE mg/dL   Nitrite NEGATIVE NEGATIVE   Leukocytes, UA NEGATIVE NEGATIVE  CBC     Status: Abnormal   Collection Time: 10/14/16 11:16 AM  Result Value Ref Range   WBC 4.9 4.0 - 10.5 K/uL   RBC 4.01 3.87 - 5.11 MIL/uL   Hemoglobin 12.4 12.0 - 15.0 g/dL   HCT 40.9 (L) 81.1 - 91.4 %   MCV 86.3 78.0 - 100.0 fL   MCH 30.9  26.0 - 34.0 pg   MCHC 35.8 30.0 - 36.0 g/dL   RDW 16.111.8 09.611.5 - 04.515.5 %   Platelets 344 150 - 400 K/uL  Comprehensive metabolic panel     Status: Abnormal   Collection Time: 10/14/16 11:16 AM  Result Value Ref Range   Sodium 136 135 - 145 mmol/L   Potassium 4.2 3.5 - 5.1 mmol/L   Chloride 107 101 - 111 mmol/L   CO2 24 22 - 32 mmol/L   Glucose, Bld 90 65 - 99 mg/dL   BUN 8 6 - 20 mg/dL   Creatinine, Ser 4.090.73 0.44 - 1.00 mg/dL   Calcium 8.9 8.9 - 81.110.3 mg/dL   Total Protein 7.5 6.5 - 8.1 g/dL   Albumin 4.2 3.5 - 5.0 g/dL   AST 16 15 - 41 U/L   ALT 12 (L) 14 - 54 U/L   Alkaline Phosphatase 38 38 - 126 U/L   Total Bilirubin 0.9 0.3 - 1.2 mg/dL   GFR calc non Af Amer >60 >60 mL/min   GFR calc Af Amer >60 >60  mL/min   Anion gap 5 5 - 15    MAU Course  Procedures  MDM Labs ordered and reviewed. Unable to give trial of Phenergan here d/t pt driving herself. No episodes of emesis. No evidence of dehydration. Stable for discharge home.  Assessment and Plan   1. Ptyalism   2. Morning sickness   3. First trimester pregnancy    [facility] Return for worsening sx    Medication List    TAKE these medications   acetaminophen 160 MG/5ML suspension Commonly known as:  TYLENOL Take 320 mg by mouth every 6 (six) hours as needed for mild pain, moderate pain or headache.   glycopyrrolate 1 MG tablet Commonly known as:  ROBINUL Take 1 tablet (1 mg total) by mouth 3 (three) times daily.   PRENATAL COMPLETE 14-0.4 MG Tabs Take 1 tablet by mouth daily.   promethazine 25 MG tablet Commonly known as:  PHENERGAN Take 1 tablet (25 mg total) by mouth every 6 (six) hours as needed for nausea or vomiting.   promethazine 25 MG suppository Commonly known as:  PHENERGAN Place 1 suppository (25 mg total) rectally every 6 (six) hours as needed for nausea.       Donette LarryMelanie Orange Hilligoss, CNM 10/14/2016, 11:02 AM

## 2016-10-14 NOTE — MAU Note (Signed)
Patient presents with inability to keep anything down.

## 2016-10-18 ENCOUNTER — Inpatient Hospital Stay (HOSPITAL_COMMUNITY)
Admission: AD | Admit: 2016-10-18 | Discharge: 2016-10-18 | Disposition: A | Discharge status: Home / Self Care | Payer: Medicaid Other | Source: Ambulatory Visit | Attending: Obstetrics and Gynecology | Admitting: Obstetrics and Gynecology

## 2016-10-18 ENCOUNTER — Encounter (HOSPITAL_COMMUNITY): Payer: Self-pay | Admitting: *Deleted

## 2016-10-18 DIAGNOSIS — R05 Cough: Secondary | ICD-10-CM | POA: Diagnosis present

## 2016-10-18 DIAGNOSIS — J069 Acute upper respiratory infection, unspecified: Secondary | ICD-10-CM | POA: Diagnosis not present

## 2016-10-18 DIAGNOSIS — B9789 Other viral agents as the cause of diseases classified elsewhere: Secondary | ICD-10-CM

## 2016-10-18 LAB — URINALYSIS, ROUTINE W REFLEX MICROSCOPIC
Glucose, UA: NEGATIVE mg/dL
HGB URINE DIPSTICK: NEGATIVE
Ketones, ur: >80 mg/dL — AB
Leukocytes, UA: NEGATIVE
NITRITE: NEGATIVE
PROTEIN: NEGATIVE mg/dL
SPECIFIC GRAVITY, URINE: 1.025 (ref 1.005–1.030)
pH: 6 (ref 5.0–8.0)

## 2016-10-18 MED ORDER — GUAIFENESIN 100 MG/5ML PO SOLN
15.0000 mL | Freq: Once | ORAL | Status: AC
  Administered 2016-10-18: 300 mg via ORAL
  Filled 2016-10-18: qty 15

## 2016-10-18 MED ORDER — GUAIFENESIN 100 MG/5ML PO SOLN
10.0000 mL | Freq: Once | ORAL | Status: DC
  Filled 2016-10-18: qty 15

## 2016-10-18 NOTE — MAU Provider Note (Signed)
History     CSN: [id number]  Arrival date and time: 10/18/16 [id number]   First Provider Initiated Contact with Patient 10/18/16 2123      Chief Complaint  Patient presents with  . Cough   Cough  This is a new problem. Episode onset: since Monday  The problem has been unchanged. The problem occurs every few hours. The cough is productive of sputum. Associated symptoms include chills, nasal congestion and shortness of breath. Pertinent negatives include no headaches or sore throat. Nothing aggravates the symptoms. She has tried nothing for the symptoms. Her past medical history is significant for asthma. patient states that she does not have an inhaler. She tried her sons "albtuerol machine".     Past Medical History:  Diagnosis Date  . Asthma    years ago    Past Surgical History:  Procedure Laterality Date  . WISDOM TOOTH EXTRACTION      Family History  Problem Relation Age of Onset  . Asthma Mother   . Hypertension Mother     Social History  Substance Use Topics  . Smoking status: Never Smoker  . Smokeless tobacco: Never Used  . Alcohol use No    Allergies: No Known Allergies  Prescriptions Prior to Admission  Medication Sig Dispense Refill Last Dose  . acetaminophen (TYLENOL) 160 MG/5ML suspension Take 320 mg by mouth every 6 (six) hours as needed for mild pain, moderate pain or headache.   Past Week at Unknown time  . glycopyrrolate (ROBINUL) 1 MG tablet Take 1 tablet (1 mg total) by mouth 3 (three) times daily. 90 tablet 0   . Prenatal Vit-Fe Fumarate-FA (PRENATAL COMPLETE) 14-0.4 MG TABS Take 1 tablet by mouth daily. 60 each 1 10/13/2016 at Unknown time  . promethazine (PHENERGAN) 25 MG suppository Place 1 suppository (25 mg total) rectally every 6 (six) hours as needed for nausea. 12 suppository 1   . promethazine (PHENERGAN) 25 MG tablet Take 1 tablet (25 mg total) by mouth every 6 (six) hours as needed for nausea or vomiting. 30 tablet 0     Review of Systems   Constitutional: Positive for chills.  HENT: Negative for sore throat.   Respiratory: Positive for cough and shortness of breath.   Gastrointestinal: Positive for nausea.  Neurological: Negative for headaches.   Physical Exam   Blood pressure 125/77, pulse 101, temperature 98.6 F (37 C), temperature source Oral, resp. rate 20, height 5\' 3"  (1.6 m), weight 128 lb 12 oz (58.4 kg), last menstrual period 09/05/2016, SpO2 98 %, unknown if currently breastfeeding.  Physical Exam  Nursing note and vitals reviewed. Constitutional: She is oriented to person, place, and time. She appears well-developed and well-nourished. No distress.  HENT:  Head: Normocephalic.  Cardiovascular: Normal rate.   Respiratory: Effort normal and breath sounds normal. No respiratory distress. She has no wheezes. She has no rales. She exhibits no tenderness.  GI: Soft. There is no tenderness. There is no rebound.  Neurological: She is alert and oriented to person, place, and time.  Skin: Skin is warm and dry.  Psychiatric: She has a normal mood and affect.    MAU Course  Procedures  MDM Patient has had guaifenesin and reports that her symptoms have improved.   Assessment and Plan   1. Viral URI with cough    DC home Comfort measures reviewed  1st Trimester precautions  RX: none, safe medication list given  Return to MAU as needed FU with OB as planned  Follow-up  Information    [facility] .   Specialty:  Obstetrics and Gynecology Contact information: [address] [place] [address] [phone]           Tawnya Crook 10/18/2016, 9:26 PM

## 2016-10-18 NOTE — Discharge Instructions (Signed)

## 2016-10-18 NOTE — MAU Note (Signed)
PT  SAYS   SHE FEELS  HARD  TO     BREATHE  - STARTED  ON Monday -   FEELS  SAME  - REGARDLESS OF  ACTIVITY .    SAYS  HAS  COUGH-  WITH  SOMETIMES MUCUS.     HAS CHILLS-  STARTED ON Monday.     VOMITS  EVERYDAY   - TAKES  PHENERGAN-   LAST TIME  WAS 0900.       ALSO SPITS   A LOT.      [facility]  -   APPOINTMENT  ON 11-13.         NO DIARRHEA.       NOBODY  IN HOUSE SICK.

## 2016-10-22 ENCOUNTER — Ambulatory Visit (HOSPITAL_COMMUNITY): Admission: RE | Admit: 2016-10-22 | Payer: Medicaid Other | Source: Ambulatory Visit

## 2016-10-25 ENCOUNTER — Emergency Department (HOSPITAL_COMMUNITY): Payer: Medicaid Other

## 2016-10-25 ENCOUNTER — Encounter (HOSPITAL_COMMUNITY): Payer: Self-pay | Admitting: Emergency Medicine

## 2016-10-25 ENCOUNTER — Emergency Department (HOSPITAL_COMMUNITY)
Admission: EM | Admit: 2016-10-25 | Discharge: 2016-10-26 | Disposition: A | Discharge status: Home / Self Care | Payer: Medicaid Other | Attending: Emergency Medicine | Admitting: Emergency Medicine

## 2016-10-25 DIAGNOSIS — K219 Gastro-esophageal reflux disease without esophagitis: Secondary | ICD-10-CM

## 2016-10-25 DIAGNOSIS — R0602 Shortness of breath: Secondary | ICD-10-CM

## 2016-10-25 DIAGNOSIS — O99611 Diseases of the digestive system complicating pregnancy, first trimester: Secondary | ICD-10-CM | POA: Diagnosis not present

## 2016-10-25 DIAGNOSIS — O219 Vomiting of pregnancy, unspecified: Secondary | ICD-10-CM | POA: Diagnosis present

## 2016-10-25 DIAGNOSIS — Z3A01 Less than 8 weeks gestation of pregnancy: Secondary | ICD-10-CM | POA: Insufficient documentation

## 2016-10-25 DIAGNOSIS — O211 Hyperemesis gravidarum with metabolic disturbance: Secondary | ICD-10-CM | POA: Diagnosis not present

## 2016-10-25 DIAGNOSIS — J45909 Unspecified asthma, uncomplicated: Secondary | ICD-10-CM | POA: Insufficient documentation

## 2016-10-25 DIAGNOSIS — O99619 Diseases of the digestive system complicating pregnancy, unspecified trimester: Secondary | ICD-10-CM

## 2016-10-25 DIAGNOSIS — O210 Mild hyperemesis gravidarum: Secondary | ICD-10-CM

## 2016-10-25 LAB — BASIC METABOLIC PANEL
ANION GAP: 15 (ref 5–15)
BUN: 7 mg/dL (ref 6–20)
CHLORIDE: 98 mmol/L — AB (ref 101–111)
CO2: 19 mmol/L — AB (ref 22–32)
CREATININE: 0.7 mg/dL (ref 0.44–1.00)
Calcium: 9.5 mg/dL (ref 8.9–10.3)
GFR calc non Af Amer: >60 mL/min (ref >60)
GLUCOSE: 93 mg/dL (ref 65–99)
Potassium: 3.1 mmol/L — ABNORMAL LOW (ref 3.5–5.1)
Sodium: 132 mmol/L — ABNORMAL LOW (ref 135–145)

## 2016-10-25 LAB — CBC
HCT: 36.5 % (ref 36.0–46.0)
HEMOGLOBIN: 13.6 g/dL (ref 12.0–15.0)
MCH: 30.7 pg (ref 26.0–34.0)
MCHC: 37.3 g/dL — ABNORMAL HIGH (ref 30.0–36.0)
MCV: 82.4 fL (ref 78.0–100.0)
PLATELETS: 377 10*3/uL (ref 150–400)
RBC: 4.43 MIL/uL (ref 3.87–5.11)
RDW: 11.6 % (ref 11.5–15.5)
WBC: 6 10*3/uL (ref 4.0–10.5)

## 2016-10-25 LAB — I-STAT BETA HCG BLOOD, ED (MC, WL, AP ONLY)

## 2016-10-25 LAB — I-STAT TROPONIN, ED: TROPONIN I, POC: 0 ng/mL (ref 0.00–0.08)

## 2016-10-25 MED ORDER — IOPAMIDOL (ISOVUE-370) INJECTION 76%
INTRAVENOUS | Status: AC
  Administered 2016-10-25: 60 mL
  Filled 2016-10-25: qty 100

## 2016-10-25 NOTE — ED Triage Notes (Signed)
Pt presents to ED for assessment of centralized chest pain starting approx 1 month ago.  [facility] and [facility] had [facility], but symptoms have not resolved.  Pt c/o Shortness of Breath, even at rest.  Pt c/o n/v and diarrhea.  N/v have been going on x 1 month but worsening x 1 week.  Diarrhea started 2 days ago. Pt c/o of it being dark.

## 2016-10-25 NOTE — ED Provider Notes (Signed)
[facility]-EMERGENCY DEPT Provider Note   CSN: [id number] Arrival date & time: 10/25/16  1910     History   Chief Complaint Chief Complaint  Patient presents with  . Shortness of Breath  . Chest Pain    HPI Stacey Reid is a 20 y.o. female G2P1, currently [redacted] weeks pregnant who presents to the ED today complaining of shortness of breath and chest pain. Patient states that for the last month she has been experiencing shortness of breath, sensation of not being able to catch her breath. She expresses the symptoms at rest and with exertion. No aggravating or alleviating factors. Patient also reports intermittent substernal chest pain that is sharp in nature. This seems to occur with lying flat. She has also been having uncontrolled nausea and vomiting which she states she experienced in her previous pregnancy. The symptom has been present for the last 7 weeks. She's been taking Phenergan suppositories with minimal relief of her symptoms. Patient also has associated productive cough with yellow-green sputum. She was [facility] for similar symptoms and was told that she has a virus but she does not believe that this is true. Patient has associated subjective fevers and chills. No reported sick contacts. She denies any vaginal bleeding, sore throat, rhinorrhea, recent travel, lower extremity swelling.  HPI  Past Medical History:  Diagnosis Date  . Asthma    years ago    Patient Active Problem List   Diagnosis Date Noted  . NVD (normal vaginal delivery) 04/02/2015  . Pre-eclampsia 03/31/2015    Past Surgical History:  Procedure Laterality Date  . WISDOM TOOTH EXTRACTION      OB History    Gravida Para Term Preterm AB Living   3 1 1  0 1 1   SAB TAB Ectopic Multiple Live Births   1 0 0 0 1       Home Medications    Prior to Admission medications   Medication Sig Start Date End Date Taking? Authorizing Provider  acetaminophen (TYLENOL) 160 MG/5ML suspension  Take 320 mg by mouth every 6 (six) hours as needed for mild pain, moderate pain or headache.    Historical Provider, MD  glycopyrrolate (ROBINUL) 1 MG tablet Take 1 tablet (1 mg total) by mouth 3 (three) times daily. 10/14/16   Donette LarryMelanie Bhambri, CNM  Prenatal Vit-Fe Fumarate-FA (PRENATAL COMPLETE) 14-0.4 MG TABS Take 1 tablet by mouth daily. 02/02/16   Nicole Pisciotta, PA-C  promethazine (PHENERGAN) 25 MG suppository Place 1 suppository (25 mg total) rectally every 6 (six) hours as needed for nausea. 10/14/16 10/14/17  Donette LarryMelanie Bhambri, CNM  promethazine (PHENERGAN) 25 MG tablet Take 1 tablet (25 mg total) by mouth every 6 (six) hours as needed for nausea or vomiting. 10/14/16   Donette LarryMelanie Bhambri, CNM    Family History Family History  Problem Relation Age of Onset  . Asthma Mother   . Hypertension Mother     Social History Social History  Substance Use Topics  . Smoking status: Never Smoker  . Smokeless tobacco: Never Used  . Alcohol use No     Allergies   Review of patient's allergies indicates no known allergies.   Review of Systems Review of Systems  All other systems reviewed and are negative.    Physical Exam Updated Vital Signs BP 128/99   Pulse 104   Temp 97.7 F (36.5 C) (Oral)   Resp 15   LMP 09/05/2016   SpO2 100%   Physical Exam  Constitutional: She  is oriented to person, place, and time. She appears well-developed and well-nourished. No distress.  HENT:  Head: Normocephalic and atraumatic.  Mouth/Throat: No oropharyngeal exudate.  Eyes: Conjunctivae and EOM are normal. Pupils are equal, round, and reactive to light. Right eye exhibits no discharge. Left eye exhibits no discharge. No scleral icterus.  Cardiovascular: Normal rate, regular rhythm, normal heart sounds and intact distal pulses.  Exam reveals no gallop and no friction rub.   No murmur heard. Pulmonary/Chest: Effort normal and breath sounds normal. No respiratory distress. She has no wheezes. She  has no rales. She exhibits no tenderness.  Abdominal: Soft. Bowel sounds are normal. She exhibits no distension. There is no tenderness. There is no guarding.  Musculoskeletal: Normal range of motion. She exhibits no edema.  Neurological: She is alert and oriented to person, place, and time.  Skin: Skin is warm and dry. No rash noted. She is not diaphoretic. No erythema. No pallor.  Psychiatric: She has a normal mood and affect. Her behavior is normal.  Nursing note and vitals reviewed.    ED Treatments / Results  Labs (all labs ordered are listed, but only abnormal results are displayed) Labs Reviewed  BASIC METABOLIC PANEL - Abnormal; Notable for the following:       Result Value   Sodium 132 (*)    Potassium 3.1 (*)    Chloride 98 (*)    CO2 19 (*)    All other components within normal limits  CBC - Abnormal; Notable for the following:    MCHC 37.3 (*)    All other components within normal limits  I-STAT BETA HCG BLOOD, ED ([facility], [facility], [facility] ONLY) - Abnormal; Notable for the following:    I-stat hCG, quantitative >2,000.0 (*)    All other components within normal limits  I-STAT TROPOININ, ED    EKG  EKG Interpretation  Date/Time:  Thursday October 25 2016 19:21:13 EDT Ventricular Rate:  106 PR Interval:  124 QRS Duration: 72 QT Interval:  356 QTC Calculation: 472 R Axis:   -36 Text Interpretation:  Sinus tachycardia Right atrial enlargement Left axis deviation Pulmonary disease pattern Minimal voltage criteria for LVH, may be normal variant Nonspecific T wave abnormality Abnormal ECG When compared with ECG of 03/31/2015, No significant change was found Confirmed by [facility]  MD, DAVID ([id number]) on 10/25/2016 7:22:27 PM Also confirmed by Adriana Simas  MD, BRIAN [phone])  on 10/25/2016 9:22:03 PM       Radiology Dg Chest 2 View  Result Date: 10/25/2016 CLINICAL DATA:  Chest pain with shortness of breath EXAM: CHEST  2 VIEW COMPARISON:  04/16/2013 FINDINGS: The heart size and mediastinal  contours are within normal limits. Both lungs are clear. The visualized skeletal structures are unremarkable. IMPRESSION: No active cardiopulmonary disease. Electronically Signed   By: Jasmine Pang M.D.   On: 10/25/2016 20:41    Procedures Procedures (including critical care time)  Medications Ordered in ED Medications - No data to display   Initial Impression / Assessment and Plan / ED Course  I have reviewed the triage vital signs and the nursing notes.  Pertinent labs & imaging results that were available during my care of the patient were reviewed by me and considered in my medical decision making (see chart for details).  Clinical Course    Patient seen in conjunction with Dr. Adriana Simas, concern for pulmonary embolism at this time given clinical history and presentation. Patient heart rate is 104 in ED. Spoke with radiologist Dr. Clovis Riley  who recommends CT scan as favorable imaging modality in this patient. Minimal risk of miscarriage and birth defects at this gestational age. Patient is [redacted] weeks pregnant. Risks versus benefits discussed with patient who expresses understanding and wants to move forward with CT scan.   CT scan did not show any sign of PE. EKG unremarkable. Low risk HEART score. Low suspicion ACS. Chest pain likely due to reflux from pregnancy. Spoke with pharmacy who recommends Zantac. Pt also requesting new refill for phenergan suppositories for hyperemesis. Will refill. Pt has scheduled follow up with OBGYN on Nov 13. Return precautions outlined in patient discharge instructions.   Patient was discussed with and seen by Dr. Adriana Simasook who agrees with the treatment plan.    Final Clinical Impressions(s) / ED Diagnoses   Final diagnoses:  Hyperemesis gravidarum  Shortness of breath  Gastroesophageal reflux in pregnancy    New Prescriptions Discharge Medication List as of 10/26/2016 12:48 AM    START taking these medications   Details  ranitidine (ZANTAC) 150 MG tablet  Take 1 tablet (150 mg total) by mouth 2 (two) times daily., Starting Fri 10/26/2016, Print         Lester KinsmanSamantha Tripp La TourDowless, PA-C 10/30/16 1313    Donnetta HutchingBrian Cook, MD 10/30/16 1431

## 2016-10-26 MED ORDER — PROMETHAZINE HCL 25 MG RE SUPP: 25.0000 mg | Freq: Four times a day (QID) | RECTAL | 1 refills | Status: DC | PRN

## 2016-10-26 MED ORDER — RANITIDINE HCL 150 MG PO TABS: 150.0000 mg | ORAL_TABLET | Freq: Two times a day (BID) | ORAL | 0 refills | Status: DC

## 2016-10-26 NOTE — Discharge Instructions (Signed)
Take phenergan as needed. Take Zantac and tums daily for heartburn and reflux. Follow up with your OBGYN for further evaluation.

## 2016-10-26 NOTE — ED Notes (Signed)
ED Provider at bedside. 

## 2016-10-29 ENCOUNTER — Inpatient Hospital Stay (HOSPITAL_COMMUNITY)
Admission: AD | Admit: 2016-10-29 | Discharge: 2016-10-29 | Disposition: A | Discharge status: Home / Self Care | Payer: Medicaid Other | Source: Ambulatory Visit | Attending: Family Medicine | Admitting: Family Medicine

## 2016-10-29 ENCOUNTER — Encounter (HOSPITAL_COMMUNITY): Payer: Self-pay | Admitting: *Deleted

## 2016-10-29 DIAGNOSIS — K117 Disturbances of salivary secretion: Secondary | ICD-10-CM | POA: Insufficient documentation

## 2016-10-29 DIAGNOSIS — O210 Mild hyperemesis gravidarum: Secondary | ICD-10-CM | POA: Diagnosis not present

## 2016-10-29 DIAGNOSIS — R634 Abnormal weight loss: Secondary | ICD-10-CM | POA: Insufficient documentation

## 2016-10-29 DIAGNOSIS — Z3A01 Less than 8 weeks gestation of pregnancy: Secondary | ICD-10-CM | POA: Diagnosis not present

## 2016-10-29 DIAGNOSIS — O26891 Other specified pregnancy related conditions, first trimester: Secondary | ICD-10-CM | POA: Insufficient documentation

## 2016-10-29 DIAGNOSIS — O211 Hyperemesis gravidarum with metabolic disturbance: Secondary | ICD-10-CM

## 2016-10-29 DIAGNOSIS — R112 Nausea with vomiting, unspecified: Secondary | ICD-10-CM | POA: Diagnosis present

## 2016-10-29 HISTORY — DX: Vomiting, unspecified: R11.10

## 2016-10-29 LAB — URINALYSIS, ROUTINE W REFLEX MICROSCOPIC
GLUCOSE, UA: NEGATIVE mg/dL
Hgb urine dipstick: NEGATIVE
Ketones, ur: >80 mg/dL — AB
LEUKOCYTES UA: NEGATIVE
NITRITE: NEGATIVE
PH: 6 (ref 5.0–8.0)
Protein, ur: NEGATIVE mg/dL
SPECIFIC GRAVITY, URINE: 1.02 (ref 1.005–1.030)

## 2016-10-29 LAB — CBC WITH DIFFERENTIAL/PLATELET
BASOS PCT: 0 %
Basophils Absolute: 0 10*3/uL (ref 0.0–0.1)
EOS PCT: 0 %
Eosinophils Absolute: 0 10*3/uL (ref 0.0–0.7)
HCT: 34.9 % — ABNORMAL LOW (ref 36.0–46.0)
Hemoglobin: 13.4 g/dL (ref 12.0–15.0)
LYMPHS PCT: 22 %
Lymphs Abs: 1.5 10*3/uL (ref 0.7–4.0)
MCH: 31.4 pg (ref 26.0–34.0)
MCHC: 38.4 g/dL — AB (ref 30.0–36.0)
MCV: 81.7 fL (ref 78.0–100.0)
Monocytes Absolute: 0.9 10*3/uL (ref 0.1–1.0)
Monocytes Relative: 13 %
NEUTROS PCT: 65 %
Neutro Abs: 4.2 10*3/uL (ref 1.7–7.7)
Other: 0 %
PLATELETS: 423 10*3/uL — AB (ref 150–400)
RBC: 4.27 MIL/uL (ref 3.87–5.11)
RDW: 11.9 % (ref 11.5–15.5)
WBC: 6.6 10*3/uL (ref 4.0–10.5)

## 2016-10-29 LAB — COMPREHENSIVE METABOLIC PANEL
ALBUMIN: 4.2 g/dL (ref 3.5–5.0)
ALT: 194 U/L — ABNORMAL HIGH (ref 14–54)
ANION GAP: 14 (ref 5–15)
AST: 99 U/L — ABNORMAL HIGH (ref 15–41)
Alkaline Phosphatase: 57 U/L (ref 38–126)
BUN: 11 mg/dL (ref 6–20)
CALCIUM: 9.8 mg/dL (ref 8.9–10.3)
CO2: 21 mmol/L — AB (ref 22–32)
Chloride: 97 mmol/L — ABNORMAL LOW (ref 101–111)
Creatinine, Ser: 0.6 mg/dL (ref 0.44–1.00)
GFR calc non Af Amer: >60 mL/min (ref >60)
GLUCOSE: 100 mg/dL — AB (ref 65–99)
POTASSIUM: 3.5 mmol/L (ref 3.5–5.1)
SODIUM: 132 mmol/L — AB (ref 135–145)
Total Bilirubin: 3.4 mg/dL — ABNORMAL HIGH (ref 0.3–1.2)
Total Protein: 7.9 g/dL (ref 6.5–8.1)

## 2016-10-29 MED ORDER — LACTATED RINGERS IV BOLUS (SEPSIS)
1000.0000 mL | Freq: Once | INTRAVENOUS | Status: AC
  Administered 2016-10-29: 1000 mL via INTRAVENOUS

## 2016-10-29 MED ORDER — METOCLOPRAMIDE HCL 10 MG PO TABS
10.0000 mg | ORAL_TABLET | Freq: Four times a day (QID) | ORAL | 0 refills | Status: DC | PRN
Start: 2016-10-29 — End: 2016-11-10

## 2016-10-29 MED ORDER — METOCLOPRAMIDE HCL 5 MG/ML IJ SOLN
10.0000 mg | Freq: Once | INTRAMUSCULAR | Status: AC
  Administered 2016-10-29: 10 mg via INTRAVENOUS
  Filled 2016-10-29: qty 2

## 2016-10-29 MED ORDER — PROMETHAZINE HCL 25 MG/ML IJ SOLN
25.0000 mg | Freq: Once | INTRAVENOUS | Status: AC
  Administered 2016-10-29: 25 mg via INTRAVENOUS
  Filled 2016-10-29: qty 1

## 2016-10-29 MED ORDER — DEXTROSE 5 % IN LACTATED RINGERS IV BOLUS
1000.0000 mL | Freq: Once | INTRAVENOUS | Status: AC
  Administered 2016-10-29: 1000 mL via INTRAVENOUS

## 2016-10-29 NOTE — MAU Provider Note (Signed)
History     CSN: [id number]  Arrival date and time: 10/29/16 1354   None     Chief Complaint  Patient presents with  . dehydrated   HPI Stacey Reid is 20 y.o. G3P1011 [redacted]w[redacted]d weeks presenting with persisitent nausea, vomiting and ptyalism.  She has a documents 20 lb weight loss from 10/14/2016 of 134 to today she is 114 lbs.  States her Mom tried to keep her hydrated with broth but she is unable to keep fluids and solid food down.  Vomiting more than 5 times today.  She has been given Robinul and Phenergan without a decrease in sxs.  She was seen 11/2 at ED for shortness of breath and chest pain. Neg CT.  Those sxs have resolved.  She had hyperemesis with last pregnancy.   Past Medical History:  Diagnosis Date  . Asthma    years ago  . Hyperemesis 2016    Past Surgical History:  Procedure Laterality Date  . WISDOM TOOTH EXTRACTION      Family History  Problem Relation Age of Onset  . Asthma Mother   . Hypertension Mother     Social History  Substance Use Topics  . Smoking status: Never Smoker  . Smokeless tobacco: Never Used  . Alcohol use No    Allergies: No Known Allergies  Prescriptions Prior to Admission  Medication Sig Dispense Refill Last Dose  . glycopyrrolate (ROBINUL) 1 MG tablet Take 1 tablet (1 mg total) by mouth 3 (three) times daily. (Patient not taking: Reported on 10/25/2016) 90 tablet 0 Not Taking at Unknown time  . Prenatal Vit-Fe Fumarate-FA (PRENATAL COMPLETE) 14-0.4 MG TABS Take 1 tablet by mouth daily. (Patient not taking: Reported on 10/25/2016) 60 each 1 Not Taking at Unknown time  . promethazine (PHENERGAN) 25 MG suppository Place 1 suppository (25 mg total) rectally every 6 (six) hours as needed for nausea. 12 suppository 1   . promethazine (PHENERGAN) 25 MG tablet Take 1 tablet (25 mg total) by mouth every 6 (six) hours as needed for nausea or vomiting. (Patient not taking: Reported on 10/25/2016) 30 tablet 0 Completed Course at Unknown  time  . ranitidine (ZANTAC) 150 MG tablet Take 1 tablet (150 mg total) by mouth 2 (two) times daily. 60 tablet 0     Review of Systems  Constitutional: Positive for malaise/fatigue and weight loss (20 lbs documented since 10/14/2016). Negative for chills and fever.  Respiratory: Negative for shortness of breath.   Cardiovascular: Negative for chest pain.  Gastrointestinal: Negative for heartburn, nausea and vomiting.  Genitourinary: Negative for dysuria, frequency, hematuria and urgency.       Neg for lower abdominal pain.  Neurological: Negative for dizziness, weakness and headaches.   Physical Exam   Blood pressure (!) 130/102, pulse 116, temperature 98.2 F (36.8 C), resp. rate 16, weight 114 lb 9.6 oz (52 kg), last menstrual period 09/05/2016, unknown if currently breastfeeding.  Physical Exam  Nursing note and vitals reviewed. Constitutional: She is oriented to person, place, and time. She appears cachectic. She has a sickly appearance.  HENT:  Head: Normocephalic.  Respiratory: Effort normal.  GI: She exhibits no distension. There is no tenderness. There is no rebound and no guarding.  Genitourinary:  Genitourinary Comments: Deferred-neg for vaginal sxs   Neurological: She is alert and oriented to person, place, and time.  Skin: Skin is warm and dry.  Psychiatric: She has a normal mood and affect. Her behavior is normal. Thought content normal.  Results for orders placed or performed during the hospital encounter of 10/29/16 (from the past 24 hour(s))  Urinalysis, Routine w reflex microscopic (not at [facility])     Status: Abnormal   Collection Time: 10/29/16  1:55 PM  Result Value Ref Range   Color, Urine YELLOW YELLOW   APPearance CLEAR CLEAR   Specific Gravity, Urine 1.020 1.005 - 1.030   pH 6.0 5.0 - 8.0   Glucose, UA NEGATIVE NEGATIVE mg/dL   Hgb urine dipstick NEGATIVE NEGATIVE   Bilirubin Urine MODERATE (A) NEGATIVE   Ketones, ur >80 (A) NEGATIVE mg/dL   Protein,  ur NEGATIVE NEGATIVE mg/dL   Nitrite NEGATIVE NEGATIVE   Leukocytes, UA NEGATIVE NEGATIVE  CBC with Differential/Platelet     Status: Abnormal   Collection Time: 10/29/16  2:40 PM  Result Value Ref Range   WBC 6.6 4.0 - 10.5 K/uL   RBC 4.27 3.87 - 5.11 MIL/uL   Hemoglobin 13.4 12.0 - 15.0 g/dL   HCT 16.134.9 (L) 09.636.0 - 04.546.0 %   MCV 81.7 78.0 - 100.0 fL   MCH 31.4 26.0 - 34.0 pg   MCHC 38.4 (H) 30.0 - 36.0 g/dL   RDW 40.911.9 81.111.5 - 91.415.5 %   Platelets 423 (H) 150 - 400 K/uL   Neutrophils Relative % 65 %   Lymphocytes Relative 22 %   Monocytes Relative 13 %   Eosinophils Relative 0 %   Basophils Relative 0 %   Other 0 %   Neutro Abs 4.2 1.7 - 7.7 K/uL   Lymphs Abs 1.5 0.7 - 4.0 K/uL   Monocytes Absolute 0.9 0.1 - 1.0 K/uL   Eosinophils Absolute 0.0 0.0 - 0.7 K/uL   Basophils Absolute 0.0 0.0 - 0.1 K/uL   Smear Review MORPHOLOGY UNREMARKABLE   Comprehensive metabolic panel     Status: Abnormal   Collection Time: 10/29/16  2:40 PM  Result Value Ref Range   Sodium 132 (L) 135 - 145 mmol/L   Potassium 3.5 3.5 - 5.1 mmol/L   Chloride 97 (L) 101 - 111 mmol/L   CO2 21 (L) 22 - 32 mmol/L   Glucose, Bld 100 (H) 65 - 99 mg/dL   BUN 11 6 - 20 mg/dL   Creatinine, Ser 7.820.60 0.44 - 1.00 mg/dL   Calcium 9.8 8.9 - 95.610.3 mg/dL   Total Protein 7.9 6.5 - 8.1 g/dL   Albumin 4.2 3.5 - 5.0 g/dL   AST 99 (H) 15 - 41 U/L   ALT 194 (H) 14 - 54 U/L   Alkaline Phosphatase 57 38 - 126 U/L   Total Bilirubin 3.4 (H) 0.3 - 1.2 mg/dL   GFR calc non Af Amer >60 >60 mL/min   GFR calc Af Amer >60 >60 mL/min   Anion gap 14 5 - 15   MAU Course  Procedures  MDM MSE Exam Labs  IV hydration with 1 liter of LR with Phenergan 25mg  IV Neg for actively vomiting in MAU but spitting.  Report from RN, patient nausea has not improved with Phenergan.  Discussed with Dr. Adrian BlackwaterStinson, will give Reglan10mg  IV and change fluids to D5LR. Patient is feeling better after Reglan IV.  Discussed with Dr. Adrian BlackwaterStinson.  Will send her home  with Rx for Reglan 10mg  po q 6 hrs prn.   Instructed to keep New OB appt with [facility] for next week.   Assessment and Plan  A:  First trimester pregnancy , 4119w5d gestation with hyperemesis       Ptyalism  Documented 20 lb weight loss over 2 week span of time  P: Rx for Reglan to pharmacy     Instructed patient to return for increased vomiting/spitting and if she is unable to eat or keep fluids down with Reglan.           Suhaylah Wampole,EVE M 10/29/2016, 3:33 PM

## 2016-10-29 NOTE — MAU Note (Signed)
Urine in lab 

## 2016-10-29 NOTE — MAU Note (Signed)
Is dehydrated.  Thirsty all the time, is dry and body hurts.

## 2016-10-29 NOTE — Discharge Instructions (Signed)
Hyperemesis Gravidarum  Hyperemesis gravidarum is a severe form of nausea and vomiting that happens during pregnancy. Hyperemesis is worse than morning sickness. It may cause you to have nausea or vomiting all day for many days. It may keep you from eating and drinking enough food and liquids. Hyperemesis usually occurs during the first half (the first 20 weeks) of pregnancy. It often goes away once a woman is in her second half of pregnancy. However, sometimes hyperemesis continues through an entire pregnancy.   CAUSES   The cause of this condition is not completely known but is thought to be related to changes in the body's hormones when pregnant. It could be from the high level of the pregnancy hormone or an increase in estrogen in the body.   SIGNS AND SYMPTOMS    Severe nausea and vomiting.   Nausea that does not go away.   Vomiting that does not allow you to keep any food down.   Weight loss and body fluid loss (dehydration).   Having no desire to eat or not liking food you have previously enjoyed.  DIAGNOSIS   Your health care provider will do a physical exam and ask you about your symptoms. He or she may also order blood tests and urine tests to make sure something else is not causing the problem.   TREATMENT   You may only need medicine to control the problem. If medicines do not control the nausea and vomiting, you will be treated in the hospital to prevent dehydration, increased acid in the blood (acidosis), weight loss, and changes in the electrolytes in your body that may harm the unborn baby (fetus). You may need IV fluids.   HOME CARE INSTRUCTIONS    Only take over-the-counter or prescription medicines as directed by your health care provider.   Try eating a couple of dry crackers or toast in the morning before getting out of bed.   Avoid foods and smells that upset your stomach.   Avoid fatty and spicy foods.   Eat 5-6 small meals a day.   Do not drink when eating meals. Drink between  meals.   For snacks, eat high-protein foods, such as cheese.   Eat or suck on things that have ginger in them. Ginger helps nausea.   Avoid food preparation. The smell of food can spoil your appetite.   Avoid iron pills and iron in your multivitamins until after 3-4 months of being pregnant. However, consult with your health care provider before stopping any prescribed iron pills.  SEEK MEDICAL CARE IF:    Your abdominal pain increases.   You have a severe headache.   You have vision problems.   You are losing weight.  SEEK IMMEDIATE MEDICAL CARE IF:    You are unable to keep fluids down.   You vomit blood.   You have constant nausea and vomiting.   You have excessive weakness.   You have extreme thirst.   You have dizziness or fainting.   You have a fever or persistent symptoms for more than 2-3 days.   You have a fever and your symptoms suddenly get worse.  MAKE SURE YOU:    Understand these instructions.   Will watch your condition.   Will get help right away if you are not doing well or get worse.     This information is not intended to replace advice given to you by your health care provider. Make sure you discuss any questions you have with   your health care provider.     Document Released: 12/10/2005 Document Revised: 09/30/2013 Document Reviewed: 07/22/2013  Elsevier Interactive Patient Education 2016 [employer]

## 2016-11-05 ENCOUNTER — Other Ambulatory Visit (HOSPITAL_COMMUNITY)
Admission: RE | Admit: 2016-11-05 | Discharge: 2016-11-05 | Disposition: A | Discharge status: Home / Self Care | Payer: Medicaid Other | Source: Ambulatory Visit | Attending: Certified Nurse Midwife | Admitting: Certified Nurse Midwife

## 2016-11-05 ENCOUNTER — Encounter: Payer: Self-pay | Admitting: Certified Nurse Midwife

## 2016-11-05 ENCOUNTER — Ambulatory Visit (INDEPENDENT_AMBULATORY_CARE_PROVIDER_SITE_OTHER): Payer: Medicaid Other | Admitting: Certified Nurse Midwife

## 2016-11-05 VITALS — BP 123/81 | HR 89 | Temp 97.4°F | Wt 123.8 lb

## 2016-11-05 DIAGNOSIS — Z01411 Encounter for gynecological examination (general) (routine) with abnormal findings: Secondary | ICD-10-CM | POA: Insufficient documentation

## 2016-11-05 DIAGNOSIS — Z23 Encounter for immunization: Secondary | ICD-10-CM | POA: Diagnosis not present

## 2016-11-05 DIAGNOSIS — Z3490 Encounter for supervision of normal pregnancy, unspecified, unspecified trimester: Secondary | ICD-10-CM

## 2016-11-05 DIAGNOSIS — J45901 Unspecified asthma with (acute) exacerbation: Secondary | ICD-10-CM | POA: Diagnosis not present

## 2016-11-05 DIAGNOSIS — Z3491 Encounter for supervision of normal pregnancy, unspecified, first trimester: Secondary | ICD-10-CM

## 2016-11-05 DIAGNOSIS — Z113 Encounter for screening for infections with a predominantly sexual mode of transmission: Secondary | ICD-10-CM | POA: Diagnosis present

## 2016-11-05 DIAGNOSIS — O219 Vomiting of pregnancy, unspecified: Secondary | ICD-10-CM

## 2016-11-05 MED ORDER — DOXYLAMINE-PYRIDOXINE 10-10 MG PO TBEC: DELAYED_RELEASE_TABLET | ORAL | 4 refills | Status: DC

## 2016-11-05 MED ORDER — ALBUTEROL SULFATE HFA 108 (90 BASE) MCG/ACT IN AERS: 2.0000 | INHALATION_SPRAY | Freq: Four times a day (QID) | RESPIRATORY_TRACT | 1 refills | Status: DC | PRN

## 2016-11-05 MED ORDER — PROMETHAZINE HCL 25 MG RE SUPP: 25.0000 mg | Freq: Four times a day (QID) | RECTAL | 12 refills | Status: DC | PRN

## 2016-11-05 NOTE — Progress Notes (Signed)
Subjective:    Stacey Reid is being seen today for her first obstetrical visit.  This is a planned pregnancy. She is at 2133w5d gestation. Her obstetrical history is significant for pre-eclampsia, asthma. Relationship with FOB: significant other, living together. Patient does intend to breast feed. Pregnancy history fully reviewed.  Not currently employed.    The information documented in the HPI was reviewed and verified.  Menstrual History: OB History    Gravida Para Term Preterm AB Living   3 1 1  0 1 1   SAB TAB Ectopic Multiple Live Births   1 0 0 0 1       Patient's last menstrual period was 09/05/2016.    Past Medical History:  Diagnosis Date  . Asthma    years ago  . Dyspnea   . Hyperemesis 2016    Past Surgical History:  Procedure Laterality Date  . WISDOM TOOTH EXTRACTION       (Not in a hospital admission) No Known Allergies  Social History  Substance Use Topics  . Smoking status: Never Smoker  . Smokeless tobacco: Never Used  . Alcohol use No    Family History  Problem Relation Age of Onset  . Asthma Mother   . Hypertension Mother   . Miscarriages / [place] Mother      Review of Systems Constitutional: + for weight loss Gastrointestinal: + for nausea & vomiting around 6-8X/day Genitourinary:negative for genital lesions and vaginal discharge and dysuria Musculoskeletal:negative for back pain Behavioral/Psych: negative for abusive relationship, depression, illegal drug usage and tobacco use    Objective:    BP 123/81   Pulse 89   Temp 97.4 F (36.3 C)   Wt 123 lb 12.8 oz (56.2 kg)   LMP 09/05/2016   BMI 21.93 kg/m  General Appearance:    Alert, cooperative, no distress, appears stated age  Head:    Normocephalic, without obvious abnormality, atraumatic  Eyes:    PERRL, conjunctiva/corneas clear, EOM's intact, fundi    benign, both eyes  Ears:    Normal TM's and external ear canals, both ears  Nose:   Nares normal, septum midline,  mucosa normal, no drainage    or sinus tenderness  Throat:   Lips, mucosa, and tongue normal; teeth and gums normal  Neck:   Supple, symmetrical, trachea midline, no adenopathy;    thyroid:  no enlargement/tenderness/nodules; no carotid   bruit or JVD  Back:     Symmetric, no curvature, ROM normal, no CVA tenderness  Lungs:     Clear to auscultation bilaterally, respirations unlabored  Chest Wall:    No tenderness or deformity   Heart:    Regular rate and rhythm, S1 and S2 normal, no murmur, rub   or gallop  Breast Exam:    No tenderness, masses, or nipple abnormality  Abdomen:     Soft, non-tender, bowel sounds active all four quadrants,    no masses, no organomegaly  Genitalia:    Normal female without lesion, discharge or tenderness  Extremities:   Extremities normal, atraumatic, no cyanosis or edema  Pulses:   2+ and symmetric all extremities  Skin:   Skin color, texture, turgor normal, no rashes or lesions  Lymph nodes:   Cervical, supraclavicular, and axillary nodes normal  Neurologic:   CNII-XII intact, normal strength, sensation and reflexes    throughout          Cervix:   Long, thick closed and posterior   Lab Review Urine pregnancy  test Labs reviewed yes Radiologic studies reviewed yes Assessment:    Pregnancy at 7111w5d weeks   H/O asthma  H/O Preeclampsia  Plan:     Influenza vaccine today.   Prenatal vitamins.  Counseling provided regarding continued use of seat belts, cessation of alcohol consumption, smoking or use of illicit drugs; infection precautions i.e., influenza/TDAP immunizations, toxoplasmosis,CMV, parvovirus, listeria and varicella; workplace safety, exercise during pregnancy; routine dental care, safe medications, sexual activity, hot tubs, saunas, pools, travel, caffeine use, fish and methlymercury, potential toxins, hair treatments, varicose veins Weight gain recommendations per IOM guidelines reviewed: underweight/BMI< 18.5--> gain 28 - 40 lbs; normal  weight/BMI 18.5 - 24.9--> gain 25 - 35 lbs; overweight/BMI 25 - 29.9--> gain 15 - 25 lbs; obese/BMI >30->gain  11 - 20 lbs Problem list reviewed and updated. FIRST/CF mutation testing/NIPT/QUAD SCREEN/fragile X/Ashkenazi Jewish population testing/Spinal muscular atrophy discussed: requested. Role of ultrasound in pregnancy discussed; fetal survey: requested. Amniocentesis discussed: not indicated. VBAC calculator score: VBAC consent form provided Meds ordered this encounter  Medications  . Doxylamine-Pyridoxine (DICLEGIS) 10-10 MG TBEC    Sig: Take 1 tablet with breakfast and lunch.  Take 2 tablets at bedtime.    Dispense:  100 tablet    Refill:  4  . promethazine (PHENERGAN) 25 MG suppository    Sig: Place 1 suppository (25 mg total) rectally every 6 (six) hours as needed for nausea or vomiting.    Dispense:  24 each    Refill:  12  . albuterol (PROVENTIL HFA;VENTOLIN HFA) 108 (90 Base) MCG/ACT inhaler    Sig: Inhale 2 puffs into the lungs every 6 (six) hours as needed for wheezing or shortness of breath.    Dispense:  18 g    Refill:  1   Orders Placed This Encounter  Procedures  . Culture, OB Urine  . Flu Vaccine QUAD 36+ mos IM (Fluarix, Quad PF)  . Obstetric Panel, Including HIV  . Hemoglobinopathy evaluation  . Varicella zoster antibody, IgG  . ToxASSURE Select 13 (MW), Urine  . Vitamin D (25 hydroxy)  . Hemoglobin A1c    Follow up in 4 weeks. 50% of 30 min visit spent on counseling and coordination of care.

## 2016-11-06 LAB — GC/CHLAMYDIA PROBE AMP (~~LOC~~) NOT AT ARMC
Chlamydia: NEGATIVE
NEISSERIA GONORRHEA: NEGATIVE

## 2016-11-07 LAB — CYTOLOGY - PAP: DIAGNOSIS: NEGATIVE

## 2016-11-07 LAB — URINE CULTURE, OB REFLEX

## 2016-11-07 LAB — CULTURE, OB URINE

## 2016-11-10 ENCOUNTER — Encounter (HOSPITAL_COMMUNITY): Payer: Self-pay

## 2016-11-10 ENCOUNTER — Inpatient Hospital Stay (HOSPITAL_COMMUNITY)
Admission: AD | Admit: 2016-11-10 | Discharge: 2016-11-10 | Disposition: A | Discharge status: Home / Self Care | Payer: Medicaid Other | Source: Ambulatory Visit | Attending: Obstetrics & Gynecology | Admitting: Obstetrics & Gynecology

## 2016-11-10 DIAGNOSIS — J45909 Unspecified asthma, uncomplicated: Secondary | ICD-10-CM | POA: Diagnosis not present

## 2016-11-10 DIAGNOSIS — O26891 Other specified pregnancy related conditions, first trimester: Secondary | ICD-10-CM | POA: Insufficient documentation

## 2016-11-10 DIAGNOSIS — O99511 Diseases of the respiratory system complicating pregnancy, first trimester: Secondary | ICD-10-CM | POA: Insufficient documentation

## 2016-11-10 DIAGNOSIS — Z3A09 9 weeks gestation of pregnancy: Secondary | ICD-10-CM | POA: Diagnosis not present

## 2016-11-10 DIAGNOSIS — K148 Other diseases of tongue: Secondary | ICD-10-CM | POA: Diagnosis present

## 2016-11-10 NOTE — MAU Provider Note (Signed)
History     CSN: [id number]  Arrival date and time: 11/10/16 1306   First Provider Initiated Contact with Patient 11/10/16 1323      No chief complaint on file.  HPI Stacey Reid 20 y.o. 3670w3d  Comes to MAU as she thinks she has thrush on her tongue.  She has had it before.  She has had these symptoms all week.   A few foods she eats cause her tongue to burn - lemonade - so she decided to come for evaluation today.  And she had noticed at home, the bumps on her tongue were gone.  Currently eating a peppermint candy as she is sitting in triage.  Says she has been eating peppermints as she has spitting in pregnancy and it helps her.  No other complaint of problems.  [facility]  OB History    Gravida Para Term Preterm AB Living   3 1 1  0 1 1   SAB TAB Ectopic Multiple Live Births   1 0 0 0 1      Past Medical History:  Diagnosis Date  . Asthma    years ago  . Dyspnea   . Hyperemesis 2016    Past Surgical History:  Procedure Laterality Date  . WISDOM TOOTH EXTRACTION      Family History  Problem Relation Age of Onset  . Asthma Mother   . Hypertension Mother   . Miscarriages / [place] Mother     Social History  Substance Use Topics  . Smoking status: Never Smoker  . Smokeless tobacco: Never Used  . Alcohol use No    Allergies: No Known Allergies  Prescriptions Prior to Admission  Medication Sig Dispense Refill Last Dose  . albuterol (PROVENTIL HFA;VENTOLIN HFA) 108 (90 Base) MCG/ACT inhaler Inhale 2 puffs into the lungs every 6 (six) hours as needed for wheezing or shortness of breath. 18 g 1 11/09/2016 at Unknown time  . Prenatal Vit-Fe Fumarate-FA (PRENATAL COMPLETE) 14-0.4 MG TABS Take 1 tablet by mouth at bedtime.   11/09/2016 at Unknown time  . promethazine (PHENERGAN) 25 MG suppository Place 1 suppository (25 mg total) rectally every 6 (six) hours as needed for nausea or vomiting. 24 each 12 11/09/2016 at Unknown time     Review of Systems  Constitutional: Negative for fever.  HENT:       Tongue burns with some foods. No white patches in mouth  Gastrointestinal: Negative for abdominal pain, nausea and vomiting.  Genitourinary:       No vaginal discharge. No vaginal bleeding. No dysuria.   Physical Exam   Blood pressure 128/87, pulse 95, temperature 98.2 F (36.8 C), resp. rate 16, height 5\' 3"  (1.6 m), weight 125 lb (56.7 kg), last menstrual period 09/05/2016, unknown if currently breastfeeding.  Physical Exam  Nursing note and vitals reviewed. Constitutional: She is oriented to person, place, and time. She appears well-developed and well-nourished.  HENT:  Head: Normocephalic.  Mouth/Throat: No oropharyngeal exudate.  Tongue pink in color - no white patches on tongue or on mucus membranes in her mouth.  Possibly her tongue is more pink than usual, but there is no clinical evidence of thrush.  All other geographic areas of her tongue are normal.  Eyes: EOM are normal.  Neck: Neck supple.  Musculoskeletal: Normal range of motion.  Neurological: She is alert and oriented to person, place, and time.  Skin: Skin is warm and dry.  Psychiatric: She has a normal  mood and affect.    MAU Course  Procedures  MDM Saw client in triage.  Advised client to avoid foods that irritate her tongue.  Doubtful this is thrush, but her tongue may be irritated if she is eating lots of hard candies in an effort not to have spitting.  Takes Robinol currently.  Assessment and Plan  Normal exam today - no thrush found  Plan Keep your appointments as scheduled. Avoid foods that irritate your tongue.  Faun Mcqueen L Tyran Huser 11/10/2016, 1:28 PM

## 2016-11-10 NOTE — MAU Note (Signed)
Patient presents with ? Thrush in her mouth, some burning with eating certain foods.

## 2016-11-13 ENCOUNTER — Other Ambulatory Visit: Payer: Self-pay | Admitting: Certified Nurse Midwife

## 2016-11-13 DIAGNOSIS — D573 Sickle-cell trait: Secondary | ICD-10-CM | POA: Insufficient documentation

## 2016-11-13 DIAGNOSIS — R7989 Other specified abnormal findings of blood chemistry: Secondary | ICD-10-CM | POA: Insufficient documentation

## 2016-11-13 LAB — OBSTETRIC PANEL, INCLUDING HIV
Antibody Screen: NEGATIVE
BASOS: 0 %
Basophils Absolute: 0 10*3/uL (ref 0.0–0.2)
EOS (ABSOLUTE): 0 10*3/uL (ref 0.0–0.4)
EOS: 0 %
HEMOGLOBIN: 10.9 g/dL — AB (ref 11.1–15.9)
HEP B S AG: NEGATIVE
HIV SCREEN 4TH GENERATION: NONREACTIVE
Hematocrit: 31.8 % — ABNORMAL LOW (ref 34.0–46.6)
Immature Grans (Abs): 0 10*3/uL (ref 0.0–0.1)
Immature Granulocytes: 0 %
LYMPHS ABS: 1.6 10*3/uL (ref 0.7–3.1)
Lymphs: 24 %
MCH: 30.4 pg (ref 26.6–33.0)
MCHC: 34.3 g/dL (ref 31.5–35.7)
MCV: 89 fL (ref 79–97)
Monocytes Absolute: 0.9 10*3/uL (ref 0.1–0.9)
Monocytes: 13 %
NEUTROS ABS: 4.2 10*3/uL (ref 1.4–7.0)
Neutrophils: 63 %
Platelets: 445 10*3/uL — ABNORMAL HIGH (ref 150–379)
RBC: 3.59 x10E6/uL — ABNORMAL LOW (ref 3.77–5.28)
RDW: 12.6 % (ref 12.3–15.4)
RH TYPE: POSITIVE
RPR: NONREACTIVE
Rubella Antibodies, IGG: 3.19 index (ref >0.99)
WBC: 6.7 10*3/uL (ref 3.4–10.8)

## 2016-11-13 LAB — VITAMIN D 25 HYDROXY (VIT D DEFICIENCY, FRACTURES): VIT D 25 HYDROXY: 9.9 ng/mL — AB (ref 30.0–100.0)

## 2016-11-13 LAB — HEMOGLOBINOPATHY EVALUATION
HGB C: 0 %
HGB S: 39.7 % — AB
Hemoglobin A2 Quantitation: 4.2 % — ABNORMAL HIGH (ref 0.7–3.1)
Hemoglobin F Quantitation: 0 % (ref 0.0–2.0)
Hgb A: 56.1 % — ABNORMAL LOW (ref 94.0–98.0)

## 2016-11-13 LAB — HEMOGLOBIN A1C
Est. average glucose Bld gHb Est-mCnc: 94 mg/dL
HEMOGLOBIN A1C: 4.9 % (ref 4.8–5.6)

## 2016-11-13 LAB — VARICELLA ZOSTER ANTIBODY, IGG: VARICELLA: 767 {index} (ref >165)

## 2016-11-13 LAB — TOXASSURE SELECT 13 (MW), URINE

## 2016-11-13 MED ORDER — VITAMIN D (ERGOCALCIFEROL) 1.25 MG (50000 UNIT) PO CAPS: 50000.0000 [IU] | ORAL_CAPSULE | ORAL | 2 refills | Status: DC

## 2016-12-03 ENCOUNTER — Encounter: Payer: Self-pay | Admitting: Obstetrics

## 2016-12-03 ENCOUNTER — Ambulatory Visit (INDEPENDENT_AMBULATORY_CARE_PROVIDER_SITE_OTHER): Payer: Medicaid Other | Admitting: Obstetrics

## 2016-12-03 VITALS — BP 112/78 | HR 87 | Temp 97.9°F | Wt 121.2 lb

## 2016-12-03 DIAGNOSIS — Z3491 Encounter for supervision of normal pregnancy, unspecified, first trimester: Secondary | ICD-10-CM

## 2016-12-03 DIAGNOSIS — Z3490 Encounter for supervision of normal pregnancy, unspecified, unspecified trimester: Secondary | ICD-10-CM

## 2016-12-03 NOTE — Progress Notes (Signed)
  Subjective:    Stacey Reid is a 20 y.o. female being seen today for her obstetrical visit. She is at 4559w5d gestation. Patient reports: nausea.  Problem List Items Addressed This Visit    Supervision of normal pregnancy, antepartum - Primary     Patient Active Problem List   Diagnosis Date Noted  . Sickle cell trait (HCC) 11/13/2016  . Low vitamin D level 11/13/2016  . Supervision of normal pregnancy, antepartum 11/05/2016  . Asthma exacerbation, mild 11/05/2016  . NVD (normal vaginal delivery) 04/02/2015  . Pre-eclampsia 03/31/2015    Objective:     BP 112/78   Pulse 87   Temp 97.9 F (36.6 C)   Wt 121 lb 3.2 oz (55 kg)   LMP 09/05/2016   BMI 21.47 kg/m  Uterine Size: Below umbilicus     Assessment:    Pregnancy @ 1259w5d  Weeks.  Less nausea. Doing well    Plan:    Problem list reviewed and updated. Labs reviewed.  Follow up in 4 weeks. FIRST/CF mutation testing/NIPT/QUAD SCREEN/fragile X/Ashkenazi Jewish population testing/Spinal muscular atrophy discussed: requested. Role of ultrasound in pregnancy discussed; fetal survey: requested. Amniocentesis discussed: not indicated. 50% of 15 minute visit spent on counseling and coordination of care.

## 2016-12-05 ENCOUNTER — Telehealth: Payer: Self-pay

## 2016-12-05 DIAGNOSIS — O219 Vomiting of pregnancy, unspecified: Secondary | ICD-10-CM

## 2016-12-05 MED ORDER — PROMETHAZINE HCL 25 MG RE SUPP: 25.0000 mg | Freq: Four times a day (QID) | RECTAL | 2 refills | Status: DC | PRN

## 2016-12-05 NOTE — Telephone Encounter (Signed)
Patient called in request medication refill, advised would send per provider approval.

## 2016-12-24 NOTE — L&D Delivery Note (Signed)
Delivery Note Admitted for IOL d/t GHTN. Received cytotec x 1, then foley bulb, then srom'd on her own. No pitocin.  At 7:18 AM a viable female was delivered via Vaginal, Spontaneous Delivery (Presentation: ROA). Lt (anterior shoulder) snug coming through, but delivered w/o any maneuvers. Vigorous infant placed directly on mom's abdomen for bonding/skin-to-skin. Delayed cord clamping, then cord clamped x 2, and cut by FOB.    APGAR: 9, 9; weight: pending at time of note.  Steady flow of blood immediately after birth, that stopped on its own.    Placenta status: delivered spontaneously intact.  Cord:3vc  with the following complications: none.  Cord pH: n/a  Anesthesia:  epidural Episiotomy: None Lacerations: deep slightly oozing 1st degree supraurethral Suture Repair: 4.0 vicryl Est. Blood Loss (mL): 350  Mom to postpartum.  Baby to Nursery. Plans to breast & bottlefeed, plans vasectomy  Marge DuncansBooker, Dalissa Lovin Randall 05/29/2017, 7:39 AM

## 2016-12-31 ENCOUNTER — Ambulatory Visit (INDEPENDENT_AMBULATORY_CARE_PROVIDER_SITE_OTHER): Payer: Medicaid Other | Admitting: Certified Nurse Midwife

## 2016-12-31 VITALS — BP 113/76 | HR 83 | Temp 98.0°F | Wt 125.7 lb

## 2016-12-31 DIAGNOSIS — D573 Sickle-cell trait: Secondary | ICD-10-CM

## 2016-12-31 DIAGNOSIS — O26892 Other specified pregnancy related conditions, second trimester: Secondary | ICD-10-CM

## 2016-12-31 DIAGNOSIS — J45901 Unspecified asthma with (acute) exacerbation: Secondary | ICD-10-CM

## 2016-12-31 DIAGNOSIS — Z3480 Encounter for supervision of other normal pregnancy, unspecified trimester: Secondary | ICD-10-CM

## 2016-12-31 DIAGNOSIS — O99012 Anemia complicating pregnancy, second trimester: Secondary | ICD-10-CM

## 2016-12-31 DIAGNOSIS — R7989 Other specified abnormal findings of blood chemistry: Secondary | ICD-10-CM

## 2016-12-31 DIAGNOSIS — O99512 Diseases of the respiratory system complicating pregnancy, second trimester: Secondary | ICD-10-CM

## 2016-12-31 DIAGNOSIS — R51 Headache: Secondary | ICD-10-CM

## 2016-12-31 DIAGNOSIS — E559 Vitamin D deficiency, unspecified: Secondary | ICD-10-CM

## 2016-12-31 MED ORDER — BUTALBITAL-APAP-CAFFEINE 50-325-40 MG PO TABS: 1.0000 | ORAL_TABLET | Freq: Four times a day (QID) | ORAL | 4 refills | Status: DC | PRN

## 2016-12-31 NOTE — Progress Notes (Signed)
   PRENATAL VISIT NOTE  Subjective:  Stacey Reid is a 21 y.o. G3P1011 at 6872w5d being seen today for ongoing prenatal care.  She is currently monitored for the following issues for this low-risk pregnancy and has Pre-eclampsia; Supervision of normal pregnancy, antepartum; Asthma exacerbation, mild; Sickle cell trait (HCC); and Low vitamin D level on her problem list.  Patient reports headache, no bleeding, no contractions, no cramping, no leaking and spitting.  Contractions: Irritability.  .  Movement: Present. Denies leaking of fluid.   The following portions of the patient's history were reviewed and updated as appropriate: allergies, current medications, past family history, past medical history, past social history, past surgical history and problem list. Problem list updated.  Objective:   Vitals:   12/31/16 1454  BP: 113/76  Pulse: 83  Temp: 98 F (36.7 C)  Weight: 125 lb 11.2 oz (57 kg)    Fetal Status: Fetal Heart Rate (bpm): 150   Movement: Present   FH: at U.   General:  Alert, oriented and cooperative. Patient is in no acute distress.  Skin: Skin is warm and dry. No rash noted.   Cardiovascular: Normal heart rate noted  Respiratory: Normal respiratory effort, no problems with respiration noted  Abdomen: Soft, gravid, appropriate for gestational age. Pain/Pressure: Present     Pelvic:  Cervical exam deferred        Extremities: Normal range of motion.  Edema: None  Mental Status: Normal mood and affect. Normal behavior. Normal judgment and thought content.   Assessment and Plan:  Pregnancy: G3P1011 at 5372w5d  1. Headache in pregnancy, antepartum, second trimester Hx of HA in last pregnancy, has tried OTC Tylenol - butalbital-acetaminophen-caffeine (FIORICET, ESGIC) 50-325-40 MG tablet; Take 1-2 tablets by mouth every 6 (six) hours as needed.  Dispense: 45 tablet; Refill: 4  2. Asthma exacerbation, mild Stable  3. Supervision of other normal pregnancy,  antepartum - US MFM OB COMP + 14 WK; Future - MaterniT21 PLUS Core+SCA - AFP, Serum, Open Spina Bifida  4. Sickle cell trait (HCC)  - US MFM OB COMP + 14 WK; Future  5. Low vitamin D level  On Vitamin D.   Preterm labor symptoms and general obstetric precautions including but not limited to vaginal bleeding, contractions, leaking of fluid and fetal movement were reviewed in detail with the patient. Please refer to After Visit Summary for other counseling recommendations.  Return in about 4 weeks (around 01/28/2017) for ROB.   Roe Coombsachelle A Orlando Devereux, CNM

## 2016-12-31 NOTE — Progress Notes (Signed)
Patient c/o migraine headaches with some blurry vision.

## 2017-01-03 ENCOUNTER — Other Ambulatory Visit: Payer: Self-pay | Admitting: Certified Nurse Midwife

## 2017-01-03 DIAGNOSIS — Z3480 Encounter for supervision of other normal pregnancy, unspecified trimester: Secondary | ICD-10-CM

## 2017-01-03 LAB — AFP, SERUM, OPEN SPINA BIFIDA
AFP MOM: 0.62
AFP VALUE AFPOSL: 30.1 ng/mL
GEST. AGE ON COLLECTION DATE: 17.3 wk
Maternal Age At EDD: 23.2 years
OSBR RISK 1 IN: 10000
Test Results:: NEGATIVE
WEIGHT: 125 [lb_av]

## 2017-01-07 ENCOUNTER — Other Ambulatory Visit: Payer: Self-pay | Admitting: Certified Nurse Midwife

## 2017-01-07 DIAGNOSIS — Z3480 Encounter for supervision of other normal pregnancy, unspecified trimester: Secondary | ICD-10-CM

## 2017-01-07 LAB — MATERNIT21 PLUS CORE+SCA
Chromosome 13: NEGATIVE
Chromosome 18: NEGATIVE
Chromosome 21: NEGATIVE
PDF: 0
Y Chromosome: NOT DETECTED

## 2017-01-10 ENCOUNTER — Encounter (HOSPITAL_COMMUNITY): Payer: Self-pay | Admitting: Certified Nurse Midwife

## 2017-01-17 ENCOUNTER — Ambulatory Visit (HOSPITAL_COMMUNITY)
Admission: RE | Admit: 2017-01-17 | Discharge: 2017-01-17 | Disposition: A | Discharge status: Home / Self Care | Payer: Medicaid Other | Source: Ambulatory Visit | Attending: Certified Nurse Midwife | Admitting: Certified Nurse Midwife

## 2017-01-17 DIAGNOSIS — Z3482 Encounter for supervision of other normal pregnancy, second trimester: Secondary | ICD-10-CM | POA: Insufficient documentation

## 2017-01-17 DIAGNOSIS — Z3A19 19 weeks gestation of pregnancy: Secondary | ICD-10-CM | POA: Diagnosis not present

## 2017-01-17 DIAGNOSIS — Z3480 Encounter for supervision of other normal pregnancy, unspecified trimester: Secondary | ICD-10-CM

## 2017-01-17 DIAGNOSIS — D573 Sickle-cell trait: Secondary | ICD-10-CM

## 2017-01-18 ENCOUNTER — Other Ambulatory Visit: Payer: Self-pay | Admitting: Certified Nurse Midwife

## 2017-01-18 DIAGNOSIS — Z3480 Encounter for supervision of other normal pregnancy, unspecified trimester: Secondary | ICD-10-CM

## 2017-01-28 ENCOUNTER — Ambulatory Visit (INDEPENDENT_AMBULATORY_CARE_PROVIDER_SITE_OTHER): Payer: Medicaid Other | Admitting: Certified Nurse Midwife

## 2017-01-28 VITALS — BP 111/69 | HR 87 | Wt 139.0 lb

## 2017-01-28 DIAGNOSIS — R7989 Other specified abnormal findings of blood chemistry: Secondary | ICD-10-CM

## 2017-01-28 DIAGNOSIS — O99012 Anemia complicating pregnancy, second trimester: Secondary | ICD-10-CM

## 2017-01-28 DIAGNOSIS — D573 Sickle-cell trait: Secondary | ICD-10-CM

## 2017-01-28 DIAGNOSIS — O09299 Supervision of pregnancy with other poor reproductive or obstetric history, unspecified trimester: Secondary | ICD-10-CM

## 2017-01-28 DIAGNOSIS — O09212 Supervision of pregnancy with history of pre-term labor, second trimester: Secondary | ICD-10-CM

## 2017-01-28 DIAGNOSIS — O99512 Diseases of the respiratory system complicating pregnancy, second trimester: Secondary | ICD-10-CM

## 2017-01-28 DIAGNOSIS — D559 Anemia due to enzyme disorder, unspecified: Secondary | ICD-10-CM

## 2017-01-28 DIAGNOSIS — Z3490 Encounter for supervision of normal pregnancy, unspecified, unspecified trimester: Secondary | ICD-10-CM

## 2017-01-28 DIAGNOSIS — J45901 Unspecified asthma with (acute) exacerbation: Secondary | ICD-10-CM

## 2017-01-28 NOTE — Progress Notes (Signed)
   PRENATAL VISIT NOTE  Subjective:  Stacey Reid is a 21 y.o. G3P1011 at 7073w5d being seen today for ongoing prenatal care.  She is currently monitored for the following issues for this low-risk pregnancy and has History of pre-eclampsia in prior pregnancy, currently pregnant; Supervision of normal pregnancy, antepartum; Asthma exacerbation, mild; Sickle cell trait (HCC); and Low vitamin D level on her problem list.  Patient reports no bleeding, no contractions, no cramping, no leaking and round ligament pain.  Contractions: Not present. Vag. Bleeding: None.  Movement: Present. Denies leaking of fluid.   The following portions of the patient's history were reviewed and updated as appropriate: allergies, current medications, past family history, past medical history, past social history, past surgical history and problem list. Problem list updated.  Objective:   Vitals:   01/28/17 1343  BP: 111/69  Pulse: 87  Weight: 139 lb (63 kg)    Fetal Status: Fetal Heart Rate (bpm): 142 Fundal Height: 21 cm Movement: Present     General:  Alert, oriented and cooperative. Patient is in no acute distress.  Skin: Skin is warm and dry. No rash noted.   Cardiovascular: Normal heart rate noted  Respiratory: Normal respiratory effort, no problems with respiration noted  Abdomen: Soft, gravid, appropriate for gestational age. Pain/Pressure: Present     Pelvic:  Cervical exam deferred        Extremities: Normal range of motion.  Edema: None  Mental Status: Normal mood and affect. Normal behavior. Normal judgment and thought content.   Assessment and Plan:  Pregnancy: G3P1011 at 3873w5d  1. Encounter for supervision of normal pregnancy, antepartum, unspecified gravidity     Round ligament pain of pregnancy, may need abdominal support belt later on.   2. Sickle cell trait (HCC)     3. Low vitamin D level     Taking weekly vitamin D. Discussed OTC drops.   4. Asthma exacerbation, mild  Has albuterol as needed.   5. History of pre-eclampsia in prior pregnancy, currently pregnant     On baby ASA.  Preterm labor symptoms and general obstetric precautions including but not limited to vaginal bleeding, contractions, leaking of fluid and fetal movement were reviewed in detail with the patient. Please refer to After Visit Summary for other counseling recommendations.  Return in about 4 weeks (around 02/25/2017) for ROB.   Roe Coombsachelle A Sevag Shearn, CNM

## 2017-01-28 NOTE — Progress Notes (Signed)
Pt has not been on Vitamin D supplement. Pt would like to know if there is a liquid form she could take, doesn't like pills. Pt is having lower right pelvic pain, ? Round ligament.

## 2017-02-15 ENCOUNTER — Inpatient Hospital Stay (HOSPITAL_COMMUNITY)
Admission: AD | Admit: 2017-02-15 | Discharge: 2017-02-15 | Disposition: A | Discharge status: Home / Self Care | Payer: Medicaid Other | Source: Ambulatory Visit | Attending: Obstetrics and Gynecology | Admitting: Obstetrics and Gynecology

## 2017-02-15 ENCOUNTER — Encounter (HOSPITAL_COMMUNITY): Payer: Self-pay | Admitting: *Deleted

## 2017-02-15 DIAGNOSIS — Z3A23 23 weeks gestation of pregnancy: Secondary | ICD-10-CM | POA: Diagnosis not present

## 2017-02-15 DIAGNOSIS — O99352 Diseases of the nervous system complicating pregnancy, second trimester: Secondary | ICD-10-CM | POA: Diagnosis not present

## 2017-02-15 DIAGNOSIS — G44209 Tension-type headache, unspecified, not intractable: Secondary | ICD-10-CM

## 2017-02-15 DIAGNOSIS — Z3480 Encounter for supervision of other normal pregnancy, unspecified trimester: Secondary | ICD-10-CM

## 2017-02-15 DIAGNOSIS — J45909 Unspecified asthma, uncomplicated: Secondary | ICD-10-CM | POA: Diagnosis not present

## 2017-02-15 DIAGNOSIS — R51 Headache: Secondary | ICD-10-CM | POA: Diagnosis present

## 2017-02-15 DIAGNOSIS — O99512 Diseases of the respiratory system complicating pregnancy, second trimester: Secondary | ICD-10-CM | POA: Insufficient documentation

## 2017-02-15 LAB — URINALYSIS, ROUTINE W REFLEX MICROSCOPIC
BILIRUBIN URINE: NEGATIVE
Glucose, UA: NEGATIVE mg/dL
Hgb urine dipstick: NEGATIVE
KETONES UR: NEGATIVE mg/dL
Nitrite: NEGATIVE
Protein, ur: NEGATIVE mg/dL
Specific Gravity, Urine: 1.012 (ref 1.005–1.030)
pH: 8 (ref 5.0–8.0)

## 2017-02-15 MED ORDER — ACETAMINOPHEN 500 MG PO TABS: 1000.0000 mg | ORAL_TABLET | Freq: Four times a day (QID) | ORAL | Status: DC | PRN

## 2017-02-15 MED ORDER — ACETAMINOPHEN 160 MG/5ML PO SOLN
650.0000 mg | Freq: Once | ORAL | Status: AC
  Administered 2017-02-15: 650 mg via ORAL
  Filled 2017-02-15: qty 20.3

## 2017-02-15 NOTE — MAU Provider Note (Signed)
Chief Complaint: Headache   First Provider Initiated Contact with Patient 02/15/17 1218     SUBJECTIVE HPI: Stacey Reid is a 21 y.o. G3P1011 at [redacted]w[redacted]d who presents to Maternity Admissions reporting h/a since yesterday.  Location: head, diffuse b/l temporal radiating to b/l occiput Quality: aching Severity: 7/10 in pain scale Duration: constant Context: pregnant, has h/o h/a with similar characteristic Timing: yesterday Modifying factors: none, has not tried tylenol Associated signs and symptoms: lower abd pain b/l  Denies contractions, leakage of fluid or vaginal bleeding. Good fetal movement.   Pregnancy Course:   Past Medical History:  Diagnosis Date  . Asthma    years ago  . Dyspnea   . Hyperemesis 2016   OB History  Gravida Para Term Preterm AB Living  3 1 1  0 1 1  SAB TAB Ectopic Multiple Live Births  1 0 0 0 1    # Outcome Date GA Lbr Len/2nd Weight Sex Delivery Anes PTL Lv  3 Current           2 SAB 01/29/16 [redacted]w[redacted]d         1 Term 04/02/15 [redacted]w[redacted]d 413:16 / 01:08 5 lb 13.5 oz (2.651 kg) M Vag-Spont EPI, Local  LIV     Past Surgical History:  Procedure Laterality Date  . WISDOM TOOTH EXTRACTION     Family History  Problem Relation Age of Onset  . Asthma Mother   . Hypertension Mother   . Miscarriages / [place] Mother    Social History  Substance Use Topics  . Smoking status: Never Smoker  . Smokeless tobacco: Never Used  . Alcohol use No   No Known Allergies Prescriptions Prior to Admission  Medication Sig Dispense Refill Last Dose  . albuterol (PROVENTIL HFA;VENTOLIN HFA) 108 (90 Base) MCG/ACT inhaler Inhale 2 puffs into the lungs every 6 (six) hours as needed for wheezing or shortness of breath. 18 g 1 11/09/2016 at Unknown time  . butalbital-acetaminophen-caffeine (FIORICET, ESGIC) 50-325-40 MG tablet Take 1-2 tablets by mouth every 6 (six) hours as needed. (Patient not taking: Reported on 01/28/2017) 45 tablet 4 Not Taking  . Prenatal Vit-Fe  Fumarate-FA (PRENATAL COMPLETE) 14-0.4 MG TABS Take 1 tablet by mouth at bedtime.   Taking  . promethazine (PHENERGAN) 25 MG suppository Place 1 suppository (25 mg total) rectally every 6 (six) hours as needed for nausea or vomiting. 24 each 2 Taking  . Vitamin D, Ergocalciferol, (DRISDOL) 50000 units CAPS capsule Take 1 capsule (50,000 Units total) by mouth every 7 (seven) days. (Patient not taking: Reported on 01/28/2017) 30 capsule 2 Not Taking    I have reviewed patient's Past Medical Hx, Surgical Hx, Family Hx, Social Hx, medications and allergies.   ROS:  Review of Systems  Constitutional: Negative for chills, fatigue and fever.  HENT: Negative for congestion and sore throat.   Eyes: Negative for photophobia and visual disturbance.  Respiratory: Negative for cough, chest tightness, shortness of breath and wheezing.   Cardiovascular: Negative for chest pain, palpitations and leg swelling.  Gastrointestinal: Positive for nausea and vomiting. Negative for abdominal pain, constipation and diarrhea.  Genitourinary: Negative for dysuria, frequency, hematuria, urgency, vaginal bleeding, vaginal discharge and vaginal pain.  Musculoskeletal: Negative for back pain, neck pain and neck stiffness.  Neurological: Negative for dizziness, weakness and headaches.    Physical Exam   Patient Vitals for the past 24 hrs:  BP Temp Temp src Pulse Resp Weight  02/15/17 1138 104/67 98.8 F (37.1 C) Oral 94  18 143 lb 6.4 oz (65 kg)   Constitutional: Well-developed, well-nourished female in no acute distress.  Cardiovascular: normal rate Respiratory: normal effort GI: Abd soft, non-tender, gravid appropriate for gestational age. Pos BS x 4 MS: Extremities nontender, no edema, normal ROM Neurologic: Alert and oriented x 4.  GU: Neg CVAT.  Pelvic: NEFG, physiologic discharge, no blood, cervix clean. No CMT     FHT:  Baseline 145 , moderate variability, accelerations present, no  decelerations Contractions: none   Labs: No results found for this or any previous visit (from the past 24 hour(s)).  Imaging:  [place] Mfm Ob Comp + 14 Wk  Result Date: 01/17/2017 ----------------------------------------------------------------------  OBSTETRICS REPORT                      (Signed Final 01/17/2017 02:30 pm) ---------------------------------------------------------------------- Patient Info  ID #:       [id number]                          D.O.B.:  [birth year] (20 yrs)  Name:       Stacey Reid                       Visit Date: 01/17/2017 12:45 pm              [place] ---------------------------------------------------------------------- Performed By  Performed By:     Vivien Rota        Ref. Address:     [address] [place],                                                             [place] [address]  Attending:        Charlsie Merles MD         Location:         [facility]  Referred By:      Duane Lope NP ---------------------------------------------------------------------- Orders   #  Description                                 Code   1  [place] MFM OB COMP + 14 WK                      76805.01  ----------------------------------------------------------------------   #  Ordered By               Order #        Accession #    Episode #   1  RACHELLE DENNEY          [id number]  [id number]     [id number]  ---------------------------------------------------------------------- Indications   [redacted] weeks gestation of pregnancy                Z3A.19   Encounter for antenatal screening for          Z36.3   malformations  ---------------------------------------------------------------------- OB History  Gravidity:    3         Term:   1         SAB:   1  Living:       1 ---------------------------------------------------------------------- Fetal Evaluation  Num Of Fetuses:     1  Fetal Heart          150  Rate(bpm):  Cardiac Activity:   Observed  Presentation:       Variable  Placenta:           Posterior, above cervical os  P. Cord Insertion:  Visualized, central  Amniotic Fluid  AFI FV:      Subjectively within normal limits                              Largest Pocket(cm)                              5.86 ---------------------------------------------------------------------- Biometry  BPD:        46  mm     G. Age:  19w 6d         81  %    CI:        72.31   %    70 - 86                                                          FL/HC:      16.7   %    16.1 - 18.3  HC:      172.1  mm     G. Age:  19w 6d         73  %    HC/AC:      1.22        1.09 - 1.39  AC:      140.9  mm     G. Age:  19w 3d         56  %    FL/BPD:     62.6   %  FL:       28.8  mm     G. Age:  18w 6d         33  %    FL/AC:      20.4   %    20 - 24  HUM:      29.5  mm     G. Age:  19w 5d         64  %  CER:      20.2  mm     G. Age:  19w 1d         51  %  NFT:         5  mm  CM:        3.8  mm  Est. FW:     283  gm    0 lb 10 oz      48  % ---------------------------------------------------------------------- Gestational Age  LMP:           19w 1d        Date:  09/05/16                 EDD:   06/12/17  U/S Today:     19w 4d                                        EDD:   06/09/17  Best:          19w 1d     Det. By:  LMP  (09/05/16)          EDD:   06/12/17 ---------------------------------------------------------------------- Anatomy  Cranium:               Appears normal         Aortic Arch:            Appears normal  Cavum:                 Appears normal         Ductal Arch:            Appears normal  Ventricles:            Appears normal         Diaphragm:              Appears normal  Choroid Plexus:        Appears normal         Stomach:                Appears normal, left                                                                        sided  Cerebellum:            Appears normal         Abdomen:                Appears normal   Posterior Fossa:       Appears normal         Abdominal Wall:         Appears nml (cord                                                                        insert, abd wall)  Nuchal Fold:           Appears normal         Cord Vessels:           Appears normal (3  vessel cord)  Face:                  Appears normal         Kidneys:                Appear normal                         (orbits and profile)  Lips:                  Appears normal         Bladder:                Appears normal  Thoracic:              Appears normal         Spine:                  Appears normal  Heart:                 Appears normal         Upper Extremities:      Appears normal                         (4CH, axis, and situs  RVOT:                  Appears normal         Lower Extremities:      Appears normal  LVOT:                  Appears normal  Other:  Parents do not wish to know sex of fetus. Open hands visualized.          Heels and 5th digit visualized. Nasal bone visualized. ---------------------------------------------------------------------- Cervix Uterus Adnexa  Cervix  Length:           5.35  cm.  Normal appearance by transabdominal scan.  Uterus  No abnormality visualized.  Left Ovary  Not visualized.  Right Ovary  Within normal limits.  Adnexa:       No abnormality visualized. No adnexal mass                visualized. ---------------------------------------------------------------------- Impression  Singleton intrauterine pregnancy at 19+1 weeks  Review of the anatomy shows no sonographic markers for  aneuploidy or structural anomalies  Amniotic fluid volume is normal  Estimated fetal weight is 283g which is growth in the 48th  percentile ---------------------------------------------------------------------- Recommendations  Normal fetal growth and development. Follow-up ultrasounds  as clinically indicated.  ----------------------------------------------------------------------                Charlsie Merles, MD Electronically Signed Final Report   01/17/2017 02:30 pm ----------------------------------------------------------------------   MAU Course: Orders Placed This Encounter  Procedures  . Urinalysis, Routine w reflex microscopic   Meds ordered this encounter  Medications  . DISCONTD: acetaminophen (TYLENOL) tablet 1,000 mg  . acetaminophen (TYLENOL) solution 650 mg    MDM:  Assessment: Headache resolved with tylenol. Remained normotensive during visit. Not c/w pre-E.  1. Acute non intractable tension-type headache   2. Supervision of other normal pregnancy, antepartum    Plan: --Discharge home in stable condition.  --Tylenol PRN for h/a. --Discussed reason to return to MAU including pre-E si/sx w/o improvement of h/a with tylenol Follow-up Information    Roe Coombs, CNM. Go  on 02/25/2017.   Specialty:  Certified Nurse Midwife Why:  Go to appt at 1:00 PM. Contact information: [address] [place] [address] [phone]           Allergies as of 02/15/2017   No Known Allergies     Medication List    TAKE these medications   albuterol 108 (90 Base) MCG/ACT inhaler Commonly known as:  PROVENTIL HFA;VENTOLIN HFA Inhale 2 puffs into the lungs every 6 (six) hours as needed for wheezing or shortness of breath.   butalbital-acetaminophen-caffeine 50-325-40 MG tablet Commonly known as:  FIORICET, ESGIC Take 1-2 tablets by mouth every 6 (six) hours as needed.   PRENATAL COMPLETE 14-0.4 MG Tabs Take 1 tablet by mouth at bedtime.   promethazine 25 MG suppository Commonly known as:  PHENERGAN Place 1 suppository (25 mg total) rectally every 6 (six) hours as needed for nausea or vomiting.   Vitamin D (Ergocalciferol) 50000 units Caps capsule Commonly known as:  DRISDOL Take 1 capsule (50,000 Units total) by mouth every 7 (seven) days.        Wendee Beaversavid J McMullen, DO, PGY-1 02/15/2017, 1:50 PM   OB FELLOW DISCHARGE ATTESTATION  I have seen and examined this patient and agree with above documentation in the resident's note.    Patient is a 21 year old G3 P1 at 23 weeks who presents with headache. Patient reports she gets regular headaches. This headache is no different. She has not taken any medicine for this headache. She usually takes Tylenol. She is unable to swallow pills. Additionally she reports some lower abdominal pain worse with walking around. She reports is sharp and bilateral. It resolves with sitting. She reports it was happening at night but improved with using a pregnancy pillow.  Gen.: Well-appearing no acute distress Pulmonary: No respiratory distress, no wheezing Cardiovascular: Regular rate, distal pulses intact Abdomen: Gravid approximately 22 week uterus, nontender to palpation no contractions palpable Extremities: +1 edema  Assessment and plan: Tension headache resolved with Tylenol advised heating pads and Tylenol when necessary at home. Ernestina PennaNicholas Jin Capote, MD 3:32 PM

## 2017-02-15 NOTE — MAU Note (Signed)
Been having like really, really bad pain in lower abd, shooting down into pelvic, started a couple days ago. Been having back to back migraines.

## 2017-02-15 NOTE — Discharge Instructions (Signed)

## 2017-02-21 ENCOUNTER — Encounter (HOSPITAL_COMMUNITY): Payer: Self-pay | Admitting: *Deleted

## 2017-02-21 ENCOUNTER — Inpatient Hospital Stay (HOSPITAL_COMMUNITY)
Admission: AD | Admit: 2017-02-21 | Discharge: 2017-02-21 | Disposition: A | Discharge status: Home / Self Care | Payer: Medicaid Other | Source: Ambulatory Visit | Attending: Obstetrics & Gynecology | Admitting: Obstetrics & Gynecology

## 2017-02-21 DIAGNOSIS — G44209 Tension-type headache, unspecified, not intractable: Secondary | ICD-10-CM

## 2017-02-21 DIAGNOSIS — O99352 Diseases of the nervous system complicating pregnancy, second trimester: Secondary | ICD-10-CM | POA: Diagnosis not present

## 2017-02-21 DIAGNOSIS — O09299 Supervision of pregnancy with other poor reproductive or obstetric history, unspecified trimester: Secondary | ICD-10-CM | POA: Diagnosis not present

## 2017-02-21 DIAGNOSIS — O9989 Other specified diseases and conditions complicating pregnancy, childbirth and the puerperium: Secondary | ICD-10-CM

## 2017-02-21 DIAGNOSIS — Z3A24 24 weeks gestation of pregnancy: Secondary | ICD-10-CM | POA: Diagnosis not present

## 2017-02-21 DIAGNOSIS — O99891 Other specified diseases and conditions complicating pregnancy: Secondary | ICD-10-CM

## 2017-02-21 DIAGNOSIS — R079 Chest pain, unspecified: Secondary | ICD-10-CM | POA: Diagnosis not present

## 2017-02-21 DIAGNOSIS — O26892 Other specified pregnancy related conditions, second trimester: Secondary | ICD-10-CM | POA: Diagnosis not present

## 2017-02-21 DIAGNOSIS — R0602 Shortness of breath: Secondary | ICD-10-CM

## 2017-02-21 DIAGNOSIS — R51 Headache: Secondary | ICD-10-CM | POA: Diagnosis present

## 2017-02-21 LAB — CBC
HCT: 30.1 % — ABNORMAL LOW (ref 36.0–46.0)
Hemoglobin: 11 g/dL — ABNORMAL LOW (ref 12.0–15.0)
MCH: 32.4 pg (ref 26.0–34.0)
MCHC: 36.5 g/dL — AB (ref 30.0–36.0)
MCV: 88.5 fL (ref 78.0–100.0)
PLATELETS: 292 10*3/uL (ref 150–400)
RBC: 3.4 MIL/uL — ABNORMAL LOW (ref 3.87–5.11)
RDW: 12.2 % (ref 11.5–15.5)
WBC: 10.6 10*3/uL — AB (ref 4.0–10.5)

## 2017-02-21 LAB — PROTEIN / CREATININE RATIO, URINE
Creatinine, Urine: 98 mg/dL
PROTEIN CREATININE RATIO: 0.06 mg/mg{creat} (ref 0.00–0.15)
TOTAL PROTEIN, URINE: 6 mg/dL

## 2017-02-21 LAB — URINALYSIS, ROUTINE W REFLEX MICROSCOPIC
BILIRUBIN URINE: NEGATIVE
GLUCOSE, UA: NEGATIVE mg/dL
HGB URINE DIPSTICK: NEGATIVE
Ketones, ur: NEGATIVE mg/dL
Leukocytes, UA: NEGATIVE
Nitrite: NEGATIVE
Protein, ur: NEGATIVE mg/dL
SPECIFIC GRAVITY, URINE: 1.013 (ref 1.005–1.030)
pH: 7 (ref 5.0–8.0)

## 2017-02-21 LAB — COMPREHENSIVE METABOLIC PANEL
ALT: 8 U/L — ABNORMAL LOW (ref 14–54)
ANION GAP: 6 (ref 5–15)
AST: 15 U/L (ref 15–41)
Albumin: 3.1 g/dL — ABNORMAL LOW (ref 3.5–5.0)
Alkaline Phosphatase: 49 U/L (ref 38–126)
BUN: 6 mg/dL (ref 6–20)
CHLORIDE: 105 mmol/L (ref 101–111)
CO2: 25 mmol/L (ref 22–32)
Calcium: 8.6 mg/dL — ABNORMAL LOW (ref 8.9–10.3)
Creatinine, Ser: 0.54 mg/dL (ref 0.44–1.00)
Glucose, Bld: 91 mg/dL (ref 65–99)
POTASSIUM: 4.2 mmol/L (ref 3.5–5.1)
Sodium: 136 mmol/L (ref 135–145)
TOTAL PROTEIN: 7 g/dL (ref 6.5–8.1)
Total Bilirubin: 0.2 mg/dL — ABNORMAL LOW (ref 0.3–1.2)

## 2017-02-21 MED ORDER — METOCLOPRAMIDE HCL 5 MG/5ML PO SOLN: 10.0000 mg | Freq: Once | ORAL | Status: DC

## 2017-02-21 MED ORDER — DIPHENHYDRAMINE HCL 12.5 MG/5ML PO ELIX
25.0000 mg | ORAL_SOLUTION | Freq: Once | ORAL | Status: AC
  Administered 2017-02-21: 25 mg via ORAL
  Filled 2017-02-21: qty 10

## 2017-02-21 MED ORDER — DEXAMETHASONE 1 MG/ML PO CONC: 10.0000 mg | Freq: Once | ORAL | Status: DC

## 2017-02-21 MED ORDER — BUTALBITAL-APAP-CAFFEINE 50-325-40 MG PO TABS
1.0000 | ORAL_TABLET | Freq: Four times a day (QID) | ORAL | Status: DC | PRN
  Administered 2017-02-21: 1 via ORAL
  Filled 2017-02-21: qty 1

## 2017-02-21 MED ORDER — ACETAMINOPHEN 160 MG/5ML PO SOLN
1000.0000 mg | Freq: Four times a day (QID) | ORAL | Status: DC | PRN
  Administered 2017-02-21: 1000 mg via ORAL
  Filled 2017-02-21 (×2): qty 40

## 2017-02-21 MED ORDER — ASPIRIN 81 MG PO CHEW
81.0000 mg | CHEWABLE_TABLET | Freq: Every day | ORAL | 0 refills | Status: AC
Start: 2017-02-21 — End: 2017-03-23

## 2017-02-21 NOTE — MAU Provider Note (Signed)
History     CSN: [id number]  Arrival date and time: 02/21/17 1740   None     Chief Complaint  Patient presents with  . Headache  . Shoulder Pain  . Abdominal Pain  . Shortness of Breath   G3P1011 @24 .1 weeks here with HA and SOB. She reports sx started 2 weeks ago. HA is in temporal and occipital regions and radiates up from shoulders. She used Tylenol yesterday and had little relief. Rates pain 4/10. SOB started 2 weeks ago. She notices it daily and when she is resting or ambulatory. She also reports pain in center of her chest. Pain started last week. Describes as intermittent, dull and achy. She is not having the pain currently. She reports "numbness pain" in right arm since last week. Describes as tingly feeling in rt arm and hand. Denies heartburn or GERD. Good FM. No VB, LOF, or ctx. Pregnancy complicated by hx of pre-e with last pregnancy.   OB History    Gravida Para Term Preterm AB Living   3 1 1  0 1 1   SAB TAB Ectopic Multiple Live Births   1 0 0 0 1      Past Medical History:  Diagnosis Date  . Asthma    years ago  . Dyspnea   . Hyperemesis 2016    Past Surgical History:  Procedure Laterality Date  . WISDOM TOOTH EXTRACTION      Family History  Problem Relation Age of Onset  . Asthma Mother   . Hypertension Mother   . Miscarriages / [place] Mother     Social History  Substance Use Topics  . Smoking status: Never Smoker  . Smokeless tobacco: Never Used  . Alcohol use No    Allergies: No Known Allergies  Prescriptions Prior to Admission  Medication Sig Dispense Refill Last Dose  . acetaminophen (TYLENOL) 160 MG/5ML suspension Take 640 mg by mouth every 6 (six) hours as needed for mild pain or headache.   02/20/2017 at Unknown time  . albuterol (PROVENTIL HFA;VENTOLIN HFA) 108 (90 Base) MCG/ACT inhaler Inhale 2 puffs into the lungs every 6 (six) hours as needed for wheezing or shortness of breath. 18 g 1 02/21/2017 at Unknown time  .  cholecalciferol (D-VI-SOL) 400 UNIT/ML LIQD Take 50,000 Units by mouth once a week. Every Wednesday.   02/20/2017 at Unknown time  . Prenatal Vit-Fe Fum-Fe Bisg-FA (NATACHEW PO) Take 1 tablet by mouth daily.   02/20/2017 at Unknown time  . butalbital-acetaminophen-caffeine (FIORICET, ESGIC) 50-325-40 MG tablet Take 1-2 tablets by mouth every 6 (six) hours as needed. (Patient not taking: Reported on 01/28/2017) 45 tablet 4 Not Taking at Unknown time  . promethazine (PHENERGAN) 25 MG suppository Place 1 suppository (25 mg total) rectally every 6 (six) hours as needed for nausea or vomiting. (Patient not taking: Reported on 02/21/2017) 24 each 2 Not Taking at Unknown time  . Vitamin D, Ergocalciferol, (DRISDOL) 50000 units CAPS capsule Take 1 capsule (50,000 Units total) by mouth every 7 (seven) days. (Patient not taking: Reported on 01/28/2017) 30 capsule 2 Not Taking at Unknown time    Review of Systems  Constitutional: Negative for fever.  Eyes: Negative for visual disturbance.  Respiratory: Negative for shortness of breath.   Cardiovascular: Positive for chest pain.  Gastrointestinal: Negative for nausea.  Genitourinary: Negative for vaginal bleeding.  Neurological: Positive for numbness and headaches. Negative for light-headedness.   Physical Exam   Blood pressure 114/66, pulse 80, temperature 98.8 F (37.1  C), temperature source Oral, resp. rate 18, height 5\' 2"  (1.575 m), weight 66.7 kg (147 lb), last menstrual period 09/05/2016, SpO2 100 %, unknown if currently breastfeeding.  Physical Exam  Nursing note and vitals reviewed. Constitutional: She is oriented to person, place, and time. She appears well-developed and well-nourished. No distress (appears comfortable).  HENT:  Head: Normocephalic and atraumatic.  Neck: Normal range of motion. Neck supple.  Cardiovascular: Normal rate, regular rhythm and normal heart sounds.   Respiratory: Effort normal and breath sounds normal. No respiratory  distress.  GI: Soft. She exhibits no distension. There is no tenderness.  Musculoskeletal: Normal range of motion. She exhibits no edema.  Neurological: She is alert and oriented to person, place, and time. She has normal reflexes.  Skin: Skin is warm and dry.  Psychiatric: She has a normal mood and affect.   EFM: 150 bpm, mod variability, + accels, no decels Toco: none  Results for orders placed or performed during the hospital encounter of 02/21/17 (from the past 24 hour(s))  Urinalysis, Routine w reflex microscopic     Status: Abnormal   Collection Time: 02/21/17  5:55 PM  Result Value Ref Range   Color, Urine YELLOW YELLOW   APPearance HAZY (A) CLEAR   Specific Gravity, Urine 1.013 1.005 - 1.030   pH 7.0 5.0 - 8.0   Glucose, UA NEGATIVE NEGATIVE mg/dL   Hgb urine dipstick NEGATIVE NEGATIVE   Bilirubin Urine NEGATIVE NEGATIVE   Ketones, ur NEGATIVE NEGATIVE mg/dL   Protein, ur NEGATIVE NEGATIVE mg/dL   Nitrite NEGATIVE NEGATIVE   Leukocytes, UA NEGATIVE NEGATIVE  Protein / creatinine ratio, urine     Status: None   Collection Time: 02/21/17  5:55 PM  Result Value Ref Range   Creatinine, Urine 98.00 mg/dL   Total Protein, Urine 6 mg/dL   Protein Creatinine Ratio 0.06 0.00 - 0.15 mg/mg[Cre]  CBC     Status: Abnormal   Collection Time: 02/21/17  6:54 PM  Result Value Ref Range   WBC 10.6 (H) 4.0 - 10.5 K/uL   RBC 3.40 (L) 3.87 - 5.11 MIL/uL   Hemoglobin 11.0 (L) 12.0 - 15.0 g/dL   HCT 11.9 (L) 14.7 - 82.9 %   MCV 88.5 78.0 - 100.0 fL   MCH 32.4 26.0 - 34.0 pg   MCHC 36.5 (H) 30.0 - 36.0 g/dL   RDW 56.2 13.0 - 86.5 %   Platelets 292 150 - 400 K/uL  Comprehensive metabolic panel     Status: Abnormal   Collection Time: 02/21/17  6:54 PM  Result Value Ref Range   Sodium 136 135 - 145 mmol/L   Potassium 4.2 3.5 - 5.1 mmol/L   Chloride 105 101 - 111 mmol/L   CO2 25 22 - 32 mmol/L   Glucose, Bld 91 65 - 99 mg/dL   BUN 6 6 - 20 mg/dL   Creatinine, Ser 7.84 0.44 - 1.00  mg/dL   Calcium 8.6 (L) 8.9 - 10.3 mg/dL   Total Protein 7.0 6.5 - 8.1 g/dL   Albumin 3.1 (L) 3.5 - 5.0 g/dL   AST 15 15 - 41 U/L   ALT 8 (L) 14 - 54 U/L   Alkaline Phosphatase 49 38 - 126 U/L   Total Bilirubin 0.2 (L) 0.3 - 1.2 mg/dL   GFR calc non Af Amer >60 >60 mL/min   GFR calc Af Amer >60 >60 mL/min   Anion gap 6 5 - 15    MAU Course  Procedures  Tylenol 1g Benadryl 25 mg (pt cannot take pills > Fioricet, Reglan, or Decadron not available in liquid).  MDM Labs ordered and reviewed. EKG ordered. Consult with Dr. Purvis SheffieldKoneswaran (cardiology), reviewed EKG, discusses pt symptoms-no concern for acute pathology, compared today's EKG to last one, would recommend outpt cardiology f/u only if CP persists. No evidence of pre-e. HA completely resolved after meds. Stable for discharge home.  Assessment and Plan   1. [redacted] weeks gestation of pregnancy   2. Acute non intractable tension-type headache   3. Shortness of breath during pregnancy   4. Chest pain, unspecified type   5. History of pre-eclampsia in prior pregnancy, currently pregnant    Discharge home Follow up in office in 4 days as scheduled Tylenol and Benadryl q6 hrs prn HA Start ASA 81 mg (hx of pre-e)  Allergies as of 02/21/2017   No Known Allergies     Medication List    STOP taking these medications   butalbital-acetaminophen-caffeine 50-325-40 MG tablet Commonly known as:  FIORICET, ESGIC   promethazine 25 MG suppository Commonly known as:  PHENERGAN   Vitamin D (Ergocalciferol) 50000 units Caps capsule Commonly known as:  DRISDOL     TAKE these medications   acetaminophen 160 MG/5ML suspension Commonly known as:  TYLENOL Take 640 mg by mouth every 6 (six) hours as needed for mild pain or headache.   albuterol 108 (90 Base) MCG/ACT inhaler Commonly known as:  PROVENTIL HFA;VENTOLIN HFA Inhale 2 puffs into the lungs every 6 (six) hours as needed for wheezing or shortness of breath.   aspirin 81 MG chewable  tablet Commonly known as:  ASPIRIN CHILDRENS Chew 1 tablet (81 mg total) by mouth daily.   cholecalciferol 400 UNIT/ML Liqd Commonly known as:  D-VI-SOL Take 50,000 Units by mouth once a week. Every Wednesday.   NATACHEW PO Take 1 tablet by mouth daily.      Donette LarryMelanie Maximillian Habibi, CNM 02/21/2017, 6:26 PM

## 2017-02-21 NOTE — Discharge Instructions (Signed)

## 2017-02-21 NOTE — MAU Note (Signed)
Pt reports she has been having headaches off and on tylenol does not help. Also c/o upper abd pain that is sharp and Shortness of breath. (stated using her inhaler more often). C/o chest discomfort and swelling in her hands. Stated she is feeling anxious at times as well.

## 2017-02-25 ENCOUNTER — Encounter: Payer: Self-pay | Admitting: Certified Nurse Midwife

## 2017-02-25 ENCOUNTER — Ambulatory Visit (INDEPENDENT_AMBULATORY_CARE_PROVIDER_SITE_OTHER): Payer: Medicaid Other | Admitting: Certified Nurse Midwife

## 2017-02-25 VITALS — BP 111/73 | HR 96 | Wt 147.6 lb

## 2017-02-25 DIAGNOSIS — D573 Sickle-cell trait: Secondary | ICD-10-CM

## 2017-02-25 DIAGNOSIS — O99012 Anemia complicating pregnancy, second trimester: Secondary | ICD-10-CM

## 2017-02-25 DIAGNOSIS — R7989 Other specified abnormal findings of blood chemistry: Secondary | ICD-10-CM

## 2017-02-25 DIAGNOSIS — O09299 Supervision of pregnancy with other poor reproductive or obstetric history, unspecified trimester: Secondary | ICD-10-CM

## 2017-02-25 DIAGNOSIS — E559 Vitamin D deficiency, unspecified: Secondary | ICD-10-CM

## 2017-02-25 DIAGNOSIS — Z3480 Encounter for supervision of other normal pregnancy, unspecified trimester: Secondary | ICD-10-CM

## 2017-02-25 DIAGNOSIS — O09292 Supervision of pregnancy with other poor reproductive or obstetric history, second trimester: Secondary | ICD-10-CM

## 2017-02-25 NOTE — Progress Notes (Signed)
Patient was started on baby ASA for pressure/pain at MAU

## 2017-02-25 NOTE — Progress Notes (Signed)
   PRENATAL VISIT NOTE  Subjective:  Stacey Reid is a 21 y.o. G3P1011 at 1575w5d being seen today for ongoing prenatal care.  She is currently monitored for the following issues for this low-risk pregnancy and has History of pre-eclampsia in prior pregnancy, currently pregnant; Supervision of normal pregnancy, antepartum; Asthma exacerbation, mild; Sickle cell trait (HCC); and Low vitamin D level on her problem list.  Patient reports no complaints.  Contractions: Irregular. Vag. Bleeding: None.  Movement: Present. Denies leaking of fluid.   The following portions of the patient's history were reviewed and updated as appropriate: allergies, current medications, past family history, past medical history, past social history, past surgical history and problem list. Problem list updated.  Objective:   Vitals:   02/25/17 1310  BP: 111/73  Pulse: 96  Weight: 147 lb 9.6 oz (67 kg)    Fetal Status: Fetal Heart Rate (bpm): 155 Fundal Height: 24 cm Movement: Present     General:  Alert, oriented and cooperative. Patient is in no acute distress.  Skin: Skin is warm and dry. No rash noted.   Cardiovascular: Normal heart rate noted  Respiratory: Normal respiratory effort, no problems with respiration noted  Abdomen: Soft, gravid, appropriate for gestational age. Pain/Pressure: Present     Pelvic:  Cervical exam deferred        Extremities: Normal range of motion.  Edema: None  Mental Status: Normal mood and affect. Normal behavior. Normal judgment and thought content.   Assessment and Plan:  Pregnancy: G3P1011 at 2575w5d  1. Supervision of other normal pregnancy, antepartum     Doing well  2. Sickle cell trait (HCC)      - Culture, OB Urine  3. Low vitamin D level     Taking weekly vitamin D.   4. History of pre-eclampsia in prior pregnancy, currently pregnant     On baby ASA.    Preterm labor symptoms and general obstetric precautions including but not limited to vaginal  bleeding, contractions, leaking of fluid and fetal movement were reviewed in detail with the patient. Please refer to After Visit Summary for other counseling recommendations.  Return in about 3 weeks (around 03/18/2017) for ROB, 2 hr OGTT.   Roe Coombsachelle A Josh Nicolosi, CNM

## 2017-03-01 LAB — URINE CULTURE, OB REFLEX

## 2017-03-01 LAB — CULTURE, OB URINE

## 2017-03-15 ENCOUNTER — Inpatient Hospital Stay (HOSPITAL_COMMUNITY)
Admission: AD | Admit: 2017-03-15 | Discharge: 2017-03-15 | Disposition: A | Discharge status: Home / Self Care | Payer: Medicaid Other | Source: Ambulatory Visit | Attending: Obstetrics & Gynecology | Admitting: Obstetrics & Gynecology

## 2017-03-15 ENCOUNTER — Encounter (HOSPITAL_COMMUNITY): Payer: Self-pay

## 2017-03-15 DIAGNOSIS — O26899 Other specified pregnancy related conditions, unspecified trimester: Secondary | ICD-10-CM

## 2017-03-15 DIAGNOSIS — R51 Headache: Secondary | ICD-10-CM | POA: Insufficient documentation

## 2017-03-15 DIAGNOSIS — Z3689 Encounter for other specified antenatal screening: Secondary | ICD-10-CM

## 2017-03-15 DIAGNOSIS — R109 Unspecified abdominal pain: Secondary | ICD-10-CM | POA: Diagnosis not present

## 2017-03-15 DIAGNOSIS — Z3480 Encounter for supervision of other normal pregnancy, unspecified trimester: Secondary | ICD-10-CM

## 2017-03-15 DIAGNOSIS — R1010 Upper abdominal pain, unspecified: Secondary | ICD-10-CM | POA: Insufficient documentation

## 2017-03-15 DIAGNOSIS — Z7982 Long term (current) use of aspirin: Secondary | ICD-10-CM | POA: Diagnosis not present

## 2017-03-15 DIAGNOSIS — Z3A27 27 weeks gestation of pregnancy: Secondary | ICD-10-CM | POA: Insufficient documentation

## 2017-03-15 DIAGNOSIS — O26892 Other specified pregnancy related conditions, second trimester: Secondary | ICD-10-CM | POA: Insufficient documentation

## 2017-03-15 LAB — PROTEIN / CREATININE RATIO, URINE
CREATININE, URINE: 88 mg/dL
Total Protein, Urine: <6 mg/dL

## 2017-03-15 LAB — CBC
HCT: 31.4 % — ABNORMAL LOW (ref 36.0–46.0)
HEMOGLOBIN: 10.9 g/dL — AB (ref 12.0–15.0)
MCH: 31.6 pg (ref 26.0–34.0)
MCHC: 34.7 g/dL (ref 30.0–36.0)
MCV: 91 fL (ref 78.0–100.0)
PLATELETS: 307 10*3/uL (ref 150–400)
RBC: 3.45 MIL/uL — ABNORMAL LOW (ref 3.87–5.11)
RDW: 11.9 % (ref 11.5–15.5)
WBC: 9.4 10*3/uL (ref 4.0–10.5)

## 2017-03-15 LAB — URINALYSIS, ROUTINE W REFLEX MICROSCOPIC
BILIRUBIN URINE: NEGATIVE
GLUCOSE, UA: NEGATIVE mg/dL
Hgb urine dipstick: NEGATIVE
Ketones, ur: NEGATIVE mg/dL
LEUKOCYTES UA: NEGATIVE
NITRITE: NEGATIVE
PH: 6 (ref 5.0–8.0)
PROTEIN: NEGATIVE mg/dL
Specific Gravity, Urine: 1.01 (ref 1.005–1.030)

## 2017-03-15 LAB — COMPREHENSIVE METABOLIC PANEL
ALT: 10 U/L — AB (ref 14–54)
ANION GAP: 5 (ref 5–15)
AST: 15 U/L (ref 15–41)
Albumin: 3 g/dL — ABNORMAL LOW (ref 3.5–5.0)
Alkaline Phosphatase: 52 U/L (ref 38–126)
BUN: <5 mg/dL — ABNORMAL LOW (ref 6–20)
CO2: 27 mmol/L (ref 22–32)
CREATININE: 0.47 mg/dL (ref 0.44–1.00)
Calcium: 8.7 mg/dL — ABNORMAL LOW (ref 8.9–10.3)
Chloride: 103 mmol/L (ref 101–111)
GFR calc non Af Amer: >60 mL/min (ref >60)
Glucose, Bld: 78 mg/dL (ref 65–99)
POTASSIUM: 3.9 mmol/L (ref 3.5–5.1)
SODIUM: 135 mmol/L (ref 135–145)
Total Bilirubin: 0.9 mg/dL (ref 0.3–1.2)
Total Protein: 6.9 g/dL (ref 6.5–8.1)

## 2017-03-15 LAB — WET PREP, GENITAL
CLUE CELLS WET PREP: NONE SEEN
SPERM: NONE SEEN
TRICH WET PREP: NONE SEEN
YEAST WET PREP: NONE SEEN

## 2017-03-15 MED ORDER — ACETAMINOPHEN 160 MG/5ML PO SOLN
1000.0000 mg | Freq: Four times a day (QID) | ORAL | Status: DC | PRN
  Administered 2017-03-15: 1000 mg via ORAL
  Filled 2017-03-15 (×2): qty 40

## 2017-03-15 MED ORDER — ACETAMINOPHEN 500 MG PO TABS: 1000.0000 mg | ORAL_TABLET | Freq: Four times a day (QID) | ORAL | Status: DC | PRN

## 2017-03-15 NOTE — MAU Note (Signed)
Pt c/o burning across the top of her stomach. Pt c/o abdominal cramping that has been getting worse. Pt states her hands and face felt swollen and tingly when she woke up this morning. This is a new finding today. Pt states baby is moving normally. Pt denies vaginal bleeding and leaking. Pt states she has had increased discharge this week.

## 2017-03-15 NOTE — Discharge Instructions (Signed)
Abdominal Pain During Pregnancy °Abdominal pain is common in pregnancy. Most of the time, it does not cause harm. There are many causes of abdominal pain. Some causes are more serious than others and sometimes the cause is not known. Abdominal pain can be a sign that something is very wrong with the pregnancy or the pain may have nothing to do with the pregnancy. Always tell your health care provider if you have any abdominal pain. °Follow these instructions at home: °· Do not have sex or put anything in your vagina until your symptoms go away completely. °· Watch your abdominal pain for any changes. °· Get plenty of rest until your pain improves. °· Drink enough fluid to keep your urine clear or pale yellow. °· Take over-the-counter or prescription medicines only as told by your health care provider. °· Keep all follow-up visits as told by your health care provider. This is important. °Contact a health care provider if: °· You have a fever. °· Your pain gets worse or you have cramping. °· Your pain continues after resting. °Get help right away if: °· You are bleeding, leaking fluid, or passing tissue from the vagina. °· You have vomiting or diarrhea that does not go away. °· You have painful or bloody urination. °· You notice a decrease in your baby's movements. °· You feel very weak or faint. °· You have shortness of breath. °· You develop a severe headache with abdominal pain. °· You have abnormal vaginal discharge with abdominal pain. °This information is not intended to replace advice given to you by your health care provider. Make sure you discuss any questions you have with your health care provider. °Document Released: 12/10/2005 Document Revised: 09/20/2016 Document Reviewed: 07/09/2013 °Elsevier Interactive Patient Education © 2017 [employer] ° °General Headache Without Cause °A headache is pain or discomfort felt around the head or neck area. The specific cause of a headache may not be found. There are  many causes and types of headaches. A few common ones are: °· Tension headaches. °· Migraine headaches. °· Cluster headaches. °· Chronic daily headaches. °Follow these instructions at home: °Watch your condition for any changes. Take these steps to help with your condition: °Managing pain  °· Take over-the-counter and prescription medicines only as told by your health care provider. °· Lie down in a dark, quiet room when you have a headache. °· If directed, apply ice to the head and neck area: °¨ Put ice in a plastic bag. °¨ Place a towel between your skin and the bag. °¨ Leave the ice on for 20 minutes, 2-3 times per day. °· Use a heating pad or hot shower to apply heat to the head and neck area as told by your health care provider. °· Keep lights dim if bright lights bother you or make your headaches worse. °Eating and drinking  °· Eat meals on a regular schedule. °· Limit alcohol use. °· Decrease the amount of caffeine you drink, or stop drinking caffeine. °General instructions  °· Keep all follow-up visits as told by your health care provider. This is important. °· Keep a headache journal to help find out what may trigger your headaches. For example, write down: °¨ What you eat and drink. °¨ How much sleep you get. °¨ Any change to your diet or medicines. °· Try massage or other relaxation techniques. °· Limit stress. °· Sit up straight, and do not tense your muscles. °· Do not use tobacco products, including cigarettes, chewing tobacco, or   e-cigarettes. If you need help quitting, ask your health care provider. °· Exercise regularly as told by your health care provider. °· Sleep on a regular schedule. Get 7-9 hours of sleep, or the amount recommended by your health care provider. °Contact a health care provider if: °· Your symptoms are not helped by medicine. °· You have a headache that is different from the usual headache. °· You have nausea or you vomit. °· You have a fever. °Get help right away if: °· Your  headache becomes severe. °· You have repeated vomiting. °· You have a stiff neck. °· You have a loss of vision. °· You have problems with speech. °· You have pain in the eye or ear. °· You have muscular weakness or loss of muscle control. °· You lose your balance or have trouble walking. °· You feel faint or pass out. °· You have confusion. °This information is not intended to replace advice given to you by your health care provider. Make sure you discuss any questions you have with your health care provider. °Document Released: 12/10/2005 Document Revised: 05/17/2016 Document Reviewed: 04/04/2015 °Elsevier Interactive Patient Education © 2017 [employer] ° °

## 2017-03-15 NOTE — MAU Provider Note (Signed)
History     CSN: [id number]  Arrival date and time: 03/15/17 1334   First Provider Initiated Contact with Patient 03/15/17 1406      G3P1011 @27 .2 weeks here with upper abdominal pain x4 days. Worse at night but occurs during the daytime also. Describes as burning and middle of upper abdomen. Denies heartburn or reflux. Emesis x1 this am. Denies visual disturbances but reports frontal HA this am. Has not used anything for it. Rates pain 3/10. She's also concerned because she woke up with swollen eyes this am and swelling in hands which required her to remove rings. Associated sx are tingling in hands. She also reports increased clear vaginal discharge in her underwear yesterday, none since. Reports +FM. No VB. Reports irregular ctx, at times up to 6 per hour. Hx of pre-e in previous pregnancy.   OB History    Gravida Para Term Preterm AB Living   3 1 1  0 1 1   SAB TAB Ectopic Multiple Live Births   1 0 0 0 1      Past Medical History:  Diagnosis Date  . Asthma    years ago  . Dyspnea   . Hyperemesis 2016    Past Surgical History:  Procedure Laterality Date  . WISDOM TOOTH EXTRACTION      Family History  Problem Relation Age of Onset  . Asthma Mother   . Hypertension Mother   . M[place]riages [place] Mother     Social History  Substance Use Topics  . Smoking status: Never Smoker  . Smokeless tobacco: Never Used  . Alcohol use No    Allergies: No Known Allergies  Prescriptions Prior to Admission  Medication Sig Dispense Refill Last Dose  . acetaminophen (TYLENOL) 160 MG/5ML suspension Take 640 mg by mouth every 6 (six) hours as needed for mild pain or headache.   Past Week at Unknown time  . albuterol (PROVENTIL HFA;VENTOLIN HFA) 108 (90 Base) MCG/ACT inhaler Inhale 2 puffs into the lungs every 6 (six) hours as needed for wheezing or shortness of breath. 18 g 1 Past Week at Unknown time  . aspirin (ASPIRIN CHILDRENS) 81 MG chewable tablet Chew 1 tablet (81 mg  total) by mouth daily. 30 tablet 0 03/14/2017 at Unknown time  . cholecalciferol (D-VI-SOL) 400 UNIT/ML LIQD Take 50,000 Units by mouth once a week. Every Wednesday.   Past Week at Unknown time  . Prenatal Vit-Fe Fum-Fe Bisg-FA (NATACHEW PO) Take 1 tablet by mouth daily.   03/15/2017 at Unknown time    Review of Systems  Eyes: Negative for visual disturbance.  Gastrointestinal: Positive for abdominal pain.  Genitourinary: Positive for vaginal discharge. Negative for vaginal bleeding.  Neurological: Positive for headaches.   Physical Exam   Blood pressure 118/68, pulse 100, temperature 98.8 F (37.1 C), temperature source Oral, resp. rate 18, height 5\' 2"  (1.575 m), last menstrual period 09/05/2016, SpO2 99 %, unknown if currently breastfeeding.  Physical Exam  Nursing note and vitals reviewed. Constitutional: She is oriented to person, place, and time. She appears well-developed and well-nourished. No distress.  HENT:  Head: Normocephalic and atraumatic.  Neck: Normal range of motion.  Cardiovascular: Normal rate, regular rhythm and normal heart sounds.   Respiratory: Effort normal and breath sounds normal. No respiratory distress.  GI: Soft. She exhibits no distension. There is no tenderness.  gravid  Genitourinary:  Genitourinary Comments: External: no lesions or erythema Vagina: rugated, parous, small thin white discharge SVE: closed/long   Musculoskeletal: Normal  range of motion. She exhibits no edema.  Neurological: She is alert and oriented to person, place, and time.  Skin: Skin is warm and dry.  Psychiatric: She has a normal mood and affect.   EFM: 145 bpm, mod variability, + accels, no decels Toco: irritability  Results for orders placed or performed during the hospital encounter of 03/15/17 (from the past 24 hour(s))  Protein / creatinine ratio, urine     Status: None   Collection Time: 03/15/17 11:34 AM  Result Value Ref Range   Creatinine, Urine 88.00 mg/dL    Total Protein, Urine <6 mg/dL   Protein Creatinine Ratio        0.00 - 0.15 mg/mg[Cre]  Urinalysis, Routine w reflex microscopic     Status: Abnormal   Collection Time: 03/15/17  1:39 PM  Result Value Ref Range   Color, Urine YELLOW YELLOW   APPearance HAZY (A) CLEAR   Specific Gravity, Urine 1.010 1.005 - 1.030   pH 6.0 5.0 - 8.0   Glucose, UA NEGATIVE NEGATIVE mg/dL   Hgb urine dipstick NEGATIVE NEGATIVE   Bilirubin Urine NEGATIVE NEGATIVE   Ketones, ur NEGATIVE NEGATIVE mg/dL   Protein, ur NEGATIVE NEGATIVE mg/dL   Nitrite NEGATIVE NEGATIVE   Leukocytes, UA NEGATIVE NEGATIVE  Wet prep, genital     Status: Abnormal   Collection Time: 03/15/17  2:15 PM  Result Value Ref Range   Yeast Wet Prep HPF POC NONE SEEN NONE SEEN   Trich, Wet Prep NONE SEEN NONE SEEN   Clue Cells Wet Prep HPF POC NONE SEEN NONE SEEN   WBC, Wet Prep HPF POC FEW (A) NONE SEEN   Sperm NONE SEEN   CBC     Status: Abnormal   Collection Time: 03/15/17  2:43 PM  Result Value Ref Range   WBC 9.4 4.0 - 10.5 K/uL   RBC 3.45 (L) 3.87 - 5.11 MIL/uL   Hemoglobin 10.9 (L) 12.0 - 15.0 g/dL   HCT 09.8 (L) 11.9 - 14.7 %   MCV 91.0 78.0 - 100.0 fL   MCH 31.6 26.0 - 34.0 pg   MCHC 34.7 30.0 - 36.0 g/dL   RDW 82.9 56.2 - 13.0 %   Platelets 307 150 - 400 K/uL  Comprehensive metabolic panel     Status: Abnormal   Collection Time: 03/15/17  2:43 PM  Result Value Ref Range   Sodium 135 135 - 145 mmol/L   Potassium 3.9 3.5 - 5.1 mmol/L   Chloride 103 101 - 111 mmol/L   CO2 27 22 - 32 mmol/L   Glucose, Bld 78 65 - 99 mg/dL   BUN <5 (L) 6 - 20 mg/dL   Creatinine, Ser 8.65 0.44 - 1.00 mg/dL   Calcium 8.7 (L) 8.9 - 10.3 mg/dL   Total Protein 6.9 6.5 - 8.1 g/dL   Albumin 3.0 (L) 3.5 - 5.0 g/dL   AST 15 15 - 41 U/L   ALT 10 (L) 14 - 54 U/L   Alkaline Phosphatase 52 38 - 126 U/L   Total Bilirubin 0.9 0.3 - 1.2 mg/dL   GFR calc non Af Amer >60 >60 mL/min   GFR calc Af Amer >60 >60 mL/min   Anion gap 5 5 - 15   MAU  Course  Procedures Tylenol 1g po  MDM Labs ordered and reviewed. No evidence of PTL or pre-e. HA improved after Tylenol. Abdominal pain likely physiologic to pregnancy. Stable for discharge home.   Assessment and Plan  1. [redacted] weeks gestation of pregnancy   2. Supervision of other normal pregnancy, antepartum   3. NST (non-stress test) reactive   4. Headache in pregnancy, antepartum, second trimester   5. Abdominal pain affecting pregnancy    Discharge home Follow up in office in 3 days as scheduled Pre-e precautions  PTL precautions Tylenol prn  Allergies as of 03/15/2017   No Known Allergies     Medication List    TAKE these medications   acetaminophen 160 MG/5ML suspension Commonly known as:  TYLENOL Take 640 mg by mouth every 6 (six) hours as needed for mild pain or headache.   albuterol 108 (90 Base) MCG/ACT inhaler Commonly known as:  PROVENTIL HFA;VENTOLIN HFA Inhale 2 puffs into the lungs every 6 (six) hours as needed for wheezing or shortness of breath.   aspirin 81 MG chewable tablet Commonly known as:  ASPIRIN CHILDRENS Chew 1 tablet (81 mg total) by mouth daily.   cholecalciferol 400 UNIT/ML Liqd Commonly known as:  D-VI-SOL Take 50,000 Units by mouth once a week. Every Wednesday.   NATACHEW PO Take 1 tablet by mouth daily.      [name], CNM 03/15/2017, 2:07 PM

## 2017-03-18 ENCOUNTER — Ambulatory Visit (INDEPENDENT_AMBULATORY_CARE_PROVIDER_SITE_OTHER): Payer: Medicaid Other | Admitting: Certified Nurse Midwife

## 2017-03-18 ENCOUNTER — Encounter: Payer: Self-pay | Admitting: Certified Nurse Midwife

## 2017-03-18 ENCOUNTER — Other Ambulatory Visit: Payer: Medicaid Other

## 2017-03-18 VITALS — BP 116/76 | HR 91 | Temp 98.7°F | Wt 156.6 lb

## 2017-03-18 DIAGNOSIS — O09299 Supervision of pregnancy with other poor reproductive or obstetric history, unspecified trimester: Secondary | ICD-10-CM

## 2017-03-18 DIAGNOSIS — D573 Sickle-cell trait: Secondary | ICD-10-CM

## 2017-03-18 DIAGNOSIS — E559 Vitamin D deficiency, unspecified: Secondary | ICD-10-CM

## 2017-03-18 DIAGNOSIS — O09212 Supervision of pregnancy with history of pre-term labor, second trimester: Secondary | ICD-10-CM

## 2017-03-18 DIAGNOSIS — Z3480 Encounter for supervision of other normal pregnancy, unspecified trimester: Secondary | ICD-10-CM

## 2017-03-18 DIAGNOSIS — Z23 Encounter for immunization: Secondary | ICD-10-CM | POA: Diagnosis not present

## 2017-03-18 DIAGNOSIS — R7989 Other specified abnormal findings of blood chemistry: Secondary | ICD-10-CM

## 2017-03-18 DIAGNOSIS — O99012 Anemia complicating pregnancy, second trimester: Secondary | ICD-10-CM

## 2017-03-18 LAB — GC/CHLAMYDIA PROBE AMP (~~LOC~~) NOT AT ARMC
Chlamydia: NEGATIVE
Neisseria Gonorrhea: NEGATIVE

## 2017-03-18 NOTE — Progress Notes (Signed)
Patient reports good fetal movement and contractions that come and go. 

## 2017-03-18 NOTE — Progress Notes (Signed)
   PRENATAL VISIT NOTE  Subjective:  Stacey Reid is a 21 y.o. G3P1011 at 4640w5d being seen today for ongoing prenatal care.  She is currently monitored for the following issues for this low-risk pregnancy and has History of pre-eclampsia in prior pregnancy, currently pregnant; Supervision of normal pregnancy, antepartum; Asthma exacerbation, mild; Sickle cell trait (HCC); and Low vitamin D level on her problem list.  Patient reports no bleeding, no cramping, no leaking and occasional contractions.  Contractions: Irregular. Vag. Bleeding: None.  Movement: Present. Denies leaking of fluid.   The following portions of the patient's history were reviewed and updated as appropriate: allergies, current medications, past family history, past medical history, past social history, past surgical history and problem list. Problem list updated.  Objective:   Vitals:   03/18/17 0906  BP: 116/76  Pulse: 91  Temp: 98.7 F (37.1 C)  Weight: 156 lb 9.6 oz (71 kg)    Fetal Status: Fetal Heart Rate (bpm): 140 Fundal Height: 27 cm Movement: Present     General:  Alert, oriented and cooperative. Patient is in no acute distress.  Skin: Skin is warm and dry. No rash noted.   Cardiovascular: Normal heart rate noted  Respiratory: Normal respiratory effort, no problems with respiration noted  Abdomen: Soft, gravid, appropriate for gestational age. Pain/Pressure: Absent     Pelvic:  Cervical exam deferred        Extremities: Normal range of motion.  Edema: Trace  Mental Status: Normal mood and affect. Normal behavior. Normal judgment and thought content.   Assessment and Plan:  Pregnancy: G3P1011 at 4040w5d  1. Supervision of other normal pregnancy, antepartum     Doing well  2. Sickle cell trait (HCC)      Urine culture 02/25/17: normal  3. Low vitamin D level     Taking vitamin D supplementation  4. History of pre-eclampsia in prior pregnancy, currently pregnant     Taking daily  ASA  Preterm labor symptoms and general obstetric precautions including but not limited to vaginal bleeding, contractions, leaking of fluid and fetal movement were reviewed in detail with the patient. Please refer to After Visit Summary for other counseling recommendations.  Return in about 2 weeks (around 04/01/2017) for ROB.   Roe Coombsachelle A Jasaun Carn, CNM

## 2017-03-19 LAB — RPR: RPR Ser Ql: NONREACTIVE

## 2017-03-19 LAB — CBC
HEMATOCRIT: 30.6 % — AB (ref 34.0–46.6)
Hemoglobin: 10.6 g/dL — ABNORMAL LOW (ref 11.1–15.9)
MCH: 31.7 pg (ref 26.6–33.0)
MCHC: 34.6 g/dL (ref 31.5–35.7)
MCV: 92 fL (ref 79–97)
Platelets: 306 10*3/uL (ref 150–379)
RBC: 3.34 x10E6/uL — AB (ref 3.77–5.28)
RDW: 12.6 % (ref 12.3–15.4)
WBC: 8.3 10*3/uL (ref 3.4–10.8)

## 2017-03-19 LAB — GLUCOSE TOLERANCE, 2 HOURS W/ 1HR
Glucose, 1 hour: 91 mg/dL (ref 65–179)
Glucose, 2 hour: 100 mg/dL (ref 65–152)
Glucose, Fasting: 78 mg/dL (ref 65–91)

## 2017-03-19 LAB — HIV ANTIBODY (ROUTINE TESTING W REFLEX): HIV Screen 4th Generation wRfx: NONREACTIVE

## 2017-03-21 ENCOUNTER — Other Ambulatory Visit: Payer: Self-pay | Admitting: Certified Nurse Midwife

## 2017-03-21 ENCOUNTER — Encounter: Payer: Self-pay | Admitting: *Deleted

## 2017-03-21 ENCOUNTER — Encounter: Payer: Self-pay | Admitting: Certified Nurse Midwife

## 2017-03-21 DIAGNOSIS — O99019 Anemia complicating pregnancy, unspecified trimester: Secondary | ICD-10-CM

## 2017-03-21 DIAGNOSIS — Z3480 Encounter for supervision of other normal pregnancy, unspecified trimester: Secondary | ICD-10-CM

## 2017-03-21 DIAGNOSIS — O99013 Anemia complicating pregnancy, third trimester: Secondary | ICD-10-CM

## 2017-03-21 HISTORY — DX: Anemia complicating pregnancy, unspecified trimester: O99.019

## 2017-04-01 ENCOUNTER — Ambulatory Visit (INDEPENDENT_AMBULATORY_CARE_PROVIDER_SITE_OTHER): Payer: Medicaid Other | Admitting: Certified Nurse Midwife

## 2017-04-01 ENCOUNTER — Encounter: Payer: Self-pay | Admitting: Certified Nurse Midwife

## 2017-04-01 VITALS — BP 129/74 | HR 101 | Wt 163.4 lb

## 2017-04-01 DIAGNOSIS — O09299 Supervision of pregnancy with other poor reproductive or obstetric history, unspecified trimester: Secondary | ICD-10-CM

## 2017-04-01 DIAGNOSIS — O09293 Supervision of pregnancy with other poor reproductive or obstetric history, third trimester: Secondary | ICD-10-CM

## 2017-04-01 DIAGNOSIS — Z3480 Encounter for supervision of other normal pregnancy, unspecified trimester: Secondary | ICD-10-CM

## 2017-04-01 NOTE — Progress Notes (Signed)
Patient has been monitored she has not had close contractions. Patient reports swelling in her hands and face.Pain is in the front and usually over night- changing position helps.

## 2017-04-01 NOTE — Progress Notes (Signed)
   PRENATAL VISIT NOTE  Subjective:  Stacey Reid is a 21 y.o. G3P1011 at [redacted]w[redacted]d being seen today for ongoing prenatal care.  She is currently monitored for the following issues for this low-risk pregnancy and has History of pre-eclampsia in prior pregnancy, currently pregnant; Supervision of normal pregnancy, antepartum; Asthma exacerbation, mild; Sickle cell trait (HCC); Low vitamin D level; and Anemia affecting pregnancy on her problem list.  Patient reports no complaints.  Contractions: Irregular. Vag. Bleeding: None.  Movement: Present. Denies leaking of fluid.   The following portions of the patient's history were reviewed and updated as appropriate: allergies, current medications, past family history, past medical history, past social history, past surgical history and problem list. Problem list updated.  Objective:   Vitals:   04/01/17 1428  BP: 129/74  Pulse: (!) 101  Weight: 163 lb 6.4 oz (74.1 kg)    Fetal Status: Fetal Heart Rate (bpm): 146 Fundal Height: 29 cm Movement: Present     General:  Alert, oriented and cooperative. Patient is in no acute distress.  Skin: Skin is warm and dry. No rash noted.   Cardiovascular: Normal heart rate noted  Respiratory: Normal respiratory effort, no problems with respiration noted  Abdomen: Soft, gravid, appropriate for gestational age. Pain/Pressure: Present     Pelvic:  Cervical exam deferred        Extremities: Normal range of motion.  Edema: None  Mental Status: Normal mood and affect. Normal behavior. Normal judgment and thought content.   Assessment and Plan:  Pregnancy: G3P1011 at [redacted]w[redacted]d  1. History of pre-eclampsia in prior pregnancy, currently pregnant     Taking baby ASA  2. Supervision of other normal pregnancy, antepartum      Doing well  Preterm labor symptoms and general obstetric precautions including but not limited to vaginal bleeding, contractions, leaking of fluid and fetal movement were reviewed in  detail with the patient. Please refer to After Visit Summary for other counseling recommendations.  Return in about 2 weeks (around 04/15/2017) for ROB.   Roe Coombs, CNM

## 2017-04-17 ENCOUNTER — Encounter: Payer: Self-pay | Admitting: Certified Nurse Midwife

## 2017-04-17 ENCOUNTER — Ambulatory Visit (INDEPENDENT_AMBULATORY_CARE_PROVIDER_SITE_OTHER): Payer: Medicaid Other | Admitting: Certified Nurse Midwife

## 2017-04-17 VITALS — BP 112/71 | HR 85 | Wt 165.0 lb

## 2017-04-17 DIAGNOSIS — O99013 Anemia complicating pregnancy, third trimester: Secondary | ICD-10-CM

## 2017-04-17 DIAGNOSIS — O09293 Supervision of pregnancy with other poor reproductive or obstetric history, third trimester: Secondary | ICD-10-CM

## 2017-04-17 DIAGNOSIS — E559 Vitamin D deficiency, unspecified: Secondary | ICD-10-CM

## 2017-04-17 DIAGNOSIS — Z3483 Encounter for supervision of other normal pregnancy, third trimester: Secondary | ICD-10-CM

## 2017-04-17 DIAGNOSIS — R7989 Other specified abnormal findings of blood chemistry: Secondary | ICD-10-CM

## 2017-04-17 DIAGNOSIS — O09299 Supervision of pregnancy with other poor reproductive or obstetric history, unspecified trimester: Secondary | ICD-10-CM

## 2017-04-17 DIAGNOSIS — Z3480 Encounter for supervision of other normal pregnancy, unspecified trimester: Secondary | ICD-10-CM

## 2017-04-17 DIAGNOSIS — D649 Anemia, unspecified: Secondary | ICD-10-CM

## 2017-04-17 NOTE — Progress Notes (Signed)
   PRENATAL VISIT NOTE  Subjective:  Stacey Reid is a 21 y.o. G3P1011 at [redacted]w[redacted]d being seen today for ongoing prenatal care.  She is currently monitored for the following issues for this low-risk pregnancy and has History of pre-eclampsia in prior pregnancy, currently pregnant; Supervision of normal pregnancy, antepartum; Asthma exacerbation, mild; Sickle cell trait (HCC); Low vitamin D level; and Anemia affecting pregnancy on her problem list.  Patient reports no complaints.  Contractions: Irritability. Vag. Bleeding: None.  Movement: Present. Denies leaking of fluid.   The following portions of the patient's history were reviewed and updated as appropriate: allergies, current medications, past family history, past medical history, past social history, past surgical history and problem list. Problem list updated.  Objective:   Vitals:   04/17/17 1306  BP: 112/71  Pulse: 85  Weight: 165 lb (74.8 kg)    Fetal Status: Fetal Heart Rate (bpm): 152 Fundal Height: 32 cm Movement: Present     General:  Alert, oriented and cooperative. Patient is in no acute distress.  Skin: Skin is warm and dry. No rash noted.   Cardiovascular: Normal heart rate noted  Respiratory: Normal respiratory effort, no problems with respiration noted  Abdomen: Soft, gravid, appropriate for gestational age. Pain/Pressure: Present     Pelvic:  Cervical exam deferred        Extremities: Normal range of motion.  Edema: Trace  Mental Status: Normal mood and affect. Normal behavior. Normal judgment and thought content.   Assessment and Plan:  Pregnancy: G3P1011 at [redacted]w[redacted]d  1. History of pre-eclampsia in prior pregnancy, currently pregnant      Taking ASA chewables.   2. Supervision of other normal pregnancy, antepartum      Doing well  3. Low vitamin D level     Taking vitamin D liquid  4. Anemia affecting pregnancy in third trimester     HgB: 10.6 on 03/18/17  Preterm labor symptoms and general  obstetric precautions including but not limited to vaginal bleeding, contractions, leaking of fluid and fetal movement were reviewed in detail with the patient. Please refer to After Visit Summary for other counseling recommendations.  Return in about 2 weeks (around 05/01/2017) for ROB.   Roe Coombs, CNM

## 2017-04-28 ENCOUNTER — Encounter (HOSPITAL_COMMUNITY): Payer: Self-pay | Admitting: *Deleted

## 2017-04-28 ENCOUNTER — Inpatient Hospital Stay (HOSPITAL_COMMUNITY)
Admission: AD | Admit: 2017-04-28 | Discharge: 2017-04-28 | Disposition: A | Discharge status: Home / Self Care | Payer: Medicaid Other | Source: Ambulatory Visit | Attending: Obstetrics and Gynecology | Admitting: Obstetrics and Gynecology

## 2017-04-28 DIAGNOSIS — O219 Vomiting of pregnancy, unspecified: Secondary | ICD-10-CM | POA: Insufficient documentation

## 2017-04-28 DIAGNOSIS — Z79899 Other long term (current) drug therapy: Secondary | ICD-10-CM | POA: Diagnosis not present

## 2017-04-28 DIAGNOSIS — M549 Dorsalgia, unspecified: Secondary | ICD-10-CM | POA: Diagnosis not present

## 2017-04-28 DIAGNOSIS — Z825 Family history of asthma and other chronic lower respiratory diseases: Secondary | ICD-10-CM | POA: Insufficient documentation

## 2017-04-28 DIAGNOSIS — Z8249 Family history of ischemic heart disease and other diseases of the circulatory system: Secondary | ICD-10-CM | POA: Diagnosis not present

## 2017-04-28 DIAGNOSIS — R51 Headache: Secondary | ICD-10-CM | POA: Diagnosis not present

## 2017-04-28 DIAGNOSIS — O99513 Diseases of the respiratory system complicating pregnancy, third trimester: Secondary | ICD-10-CM | POA: Diagnosis present

## 2017-04-28 DIAGNOSIS — R109 Unspecified abdominal pain: Secondary | ICD-10-CM | POA: Diagnosis not present

## 2017-04-28 DIAGNOSIS — Z3A33 33 weeks gestation of pregnancy: Secondary | ICD-10-CM | POA: Insufficient documentation

## 2017-04-28 DIAGNOSIS — O26893 Other specified pregnancy related conditions, third trimester: Secondary | ICD-10-CM | POA: Insufficient documentation

## 2017-04-28 DIAGNOSIS — R112 Nausea with vomiting, unspecified: Secondary | ICD-10-CM | POA: Diagnosis present

## 2017-04-28 LAB — URINALYSIS, ROUTINE W REFLEX MICROSCOPIC
Bilirubin Urine: NEGATIVE
Glucose, UA: NEGATIVE mg/dL
Hgb urine dipstick: NEGATIVE
Ketones, ur: NEGATIVE mg/dL
LEUKOCYTES UA: NEGATIVE
NITRITE: NEGATIVE
PH: 6 (ref 5.0–8.0)
Protein, ur: NEGATIVE mg/dL
SPECIFIC GRAVITY, URINE: 1.01 (ref 1.005–1.030)

## 2017-04-28 NOTE — Discharge Instructions (Signed)
Keep your appointment in the office this week. Continue to drink lots of fluids as you have been doing. You may have a virus causing you to vomit - eat bland foods today and tomorrow. Use an ice pack on your back to ease the pain. Check the dose of tylenol you are using - 650 mg is the appropriate dose.

## 2017-04-28 NOTE — MAU Note (Signed)
Pt presents to MAU with complaints of nausea, vomiting, lower abdominal cramping and headache since yesterday

## 2017-04-28 NOTE — MAU Provider Note (Signed)
+ History     CSN: [id number]  Arrival date and time: 04/28/17 1621   First Provider Initiated Contact with Patient 04/28/17 1652      Chief Complaint  Patient presents with  . Nausea  . Emesis  . Abdominal Pain  . Headache   HPI Stacey Reid 21 y.o. 3972w4d  Comes to MAU today with vomiting twice today.  Vomited bile this afternoon.  [facility] - did not vomit any food particles.  Was worried about the baby with this vomiting.  [facility] [place].  Is complaining of low back pain today.  States tylenol does not work - usually take 5ml of liquid tylenol - discussed this is likely not an appropriate dose for an adult as tylenol liquid is designed for children. That may be the reason that tylenol does not work for her.   OB History    Gravida Para Term Preterm AB Living   3 1 1  0 1 1   SAB TAB Ectopic Multiple Live Births   1 0 0 0 1      Past Medical History:  Diagnosis Date  . Anemia affecting pregnancy 03/21/2017  . Asthma    years ago  . Dyspnea   . Hyperemesis 2016    Past Surgical History:  Procedure Laterality Date  . WISDOM TOOTH EXTRACTION      Family History  Problem Relation Age of Onset  . Asthma Mother   . Hypertension Mother   . Miscarriages / [place] Mother     Social History  Substance Use Topics  . Smoking status: Never Smoker  . Smokeless tobacco: Never Used  . Alcohol use No    Allergies: No Known Allergies  Prescriptions Prior to Admission  Medication Sig Dispense Refill Last Dose  . acetaminophen (TYLENOL) 160 MG/5ML suspension Take 320 mg by mouth every 6 (six) hours as needed for mild pain, moderate pain, fever or headache.    Past Month at Unknown time  . albuterol (PROVENTIL HFA;VENTOLIN HFA) 108 (90 Base) MCG/ACT inhaler Inhale 2 puffs into the lungs every 6 (six) hours as needed for wheezing or shortness of breath. 18 g 1 04/28/2017 at Unknown time  . ergocalciferol (DRISDOL)  8000 UNIT/ML drops Take 50,000 Units by mouth every Wednesday.   04/24/2017 at unknown  . ferrous sulfate (FER-IN-SOL) 75 (15 Fe) MG/ML SOLN Take 60 mg of iron by mouth daily.   04/27/2017 at Unknown time  . Prenatal Vit-Fe Fum-Fe Bisg-FA (NATACHEW) 28-1 MG CHEW Chew 1 tablet by mouth daily.   04/27/2017 at Unknown time    Review of Systems  Constitutional: Negative for fever.  Gastrointestinal: Positive for nausea and vomiting. Negative for abdominal pain.  Musculoskeletal: Positive for back pain.   Physical Exam   Blood pressure 121/80, pulse 99, temperature 99.3 F (37.4 C), resp. rate 18, height 5\' 3"  (1.6 m), weight 165 lb (74.8 kg), last menstrual period 09/05/2016, SpO2 100 %, unknown if currently breastfeeding.  Physical Exam  Nursing note and vitals reviewed. Constitutional: She is oriented to person, place, and time. She appears well-developed and well-nourished.  HENT:  Head: Normocephalic.  Eyes: EOM are normal.  Neck: Neck supple.  GI: Soft. There is no tenderness. There is no rebound and no guarding.  On fetal monitor - FHT baseline 130 with more than 15x15 accels  - reactive strip.  No contractions seen - not in labor  Genitourinary:  Genitourinary Comments: Speculum exam - no leaking, cervix appears closed. Bimanual exam - presenting part is high.  Cervix internal os is closed, cervix is long and soft.  Musculoskeletal: Normal range of motion.  Neurological: She is alert and oriented to person, place, and time.  Skin: Skin is warm and dry.  Psychiatric: She has a normal mood and affect.    MAU Course  Procedures Results for orders placed or performed during the hospital encounter of 04/28/17 (from the past 24 hour(s))  Urinalysis, Routine w reflex microscopic     Status: None   Collection Time: 04/28/17  4:24 PM  Result Value Ref Range   Color, Urine YELLOW YELLOW   APPearance CLEAR CLEAR   Specific Gravity, Urine 1.010 1.005 - 1.030   pH 6.0 5.0 - 8.0   Glucose,  UA NEGATIVE NEGATIVE mg/dL   Hgb urine dipstick NEGATIVE NEGATIVE   Bilirubin Urine NEGATIVE NEGATIVE   Ketones, ur NEGATIVE NEGATIVE mg/dL   Protein, ur NEGATIVE NEGATIVE mg/dL   Nitrite NEGATIVE NEGATIVE   Leukocytes, UA NEGATIVE NEGATIVE    MDM Likely her walking yesterday is preciptating her low back pain - no contractions on the monitor - not in labor - cervix is closed.  Baby is reactive.  Discussed a bland diet for tonight.  While she may have a viral GI illness, she needs to continue lots of fluids and eat lightly today.  Assessment and Plan  Vomiting in pregnancy, third trimester Back pain in pregnancy, third trimester  Plan Keep your appointment in the office on May 10 Drink at least 8 8-oz glasses of water every day. Take Tylenol 325 mg 2 tablets by mouth every 4 hours if needed for pain. - use an appropriate dose.  Consider cutting tablets in half to be able swallow. Practice swallowing the tiny chocolate M&Ms so you can graduate to swallowing pills. Return if you develop abdominal pain, leaking of fluid or vaginal bleeding.  Stacey Reid 04/28/2017, 5:16 PM

## 2017-05-02 ENCOUNTER — Encounter: Payer: Self-pay | Admitting: Certified Nurse Midwife

## 2017-05-02 ENCOUNTER — Ambulatory Visit (INDEPENDENT_AMBULATORY_CARE_PROVIDER_SITE_OTHER): Payer: Medicaid Other | Admitting: Certified Nurse Midwife

## 2017-05-02 VITALS — BP 125/77 | HR 98 | Wt 170.2 lb

## 2017-05-02 DIAGNOSIS — R7989 Other specified abnormal findings of blood chemistry: Secondary | ICD-10-CM

## 2017-05-02 DIAGNOSIS — D573 Sickle-cell trait: Secondary | ICD-10-CM

## 2017-05-02 DIAGNOSIS — O09299 Supervision of pregnancy with other poor reproductive or obstetric history, unspecified trimester: Secondary | ICD-10-CM

## 2017-05-02 DIAGNOSIS — E559 Vitamin D deficiency, unspecified: Secondary | ICD-10-CM

## 2017-05-02 DIAGNOSIS — Z3480 Encounter for supervision of other normal pregnancy, unspecified trimester: Secondary | ICD-10-CM

## 2017-05-02 DIAGNOSIS — O99013 Anemia complicating pregnancy, third trimester: Secondary | ICD-10-CM

## 2017-05-02 DIAGNOSIS — Z3483 Encounter for supervision of other normal pregnancy, third trimester: Secondary | ICD-10-CM

## 2017-05-02 DIAGNOSIS — O09293 Supervision of pregnancy with other poor reproductive or obstetric history, third trimester: Secondary | ICD-10-CM

## 2017-05-02 NOTE — Progress Notes (Signed)
   PRENATAL VISIT NOTE  Subjective:  Stacey Reid is a 21 y.o. G3P1011 at 3440w1d being seen today for ongoing prenatal care.  She is currently monitored for the following issues for this low-risk pregnancy and has History of pre-eclampsia in prior pregnancy, currently pregnant; Supervision of normal pregnancy, antepartum; Asthma exacerbation, mild; Sickle cell trait (HCC); Low vitamin D level; and Anemia affecting pregnancy on her problem list.  Patient reports no complaints.  Contractions: Irregular. Vag. Bleeding: None.  Movement: Present. Denies leaking of fluid.   The following portions of the patient's history were reviewed and updated as appropriate: allergies, current medications, past family history, past medical history, past social history, past surgical history and problem list. Problem list updated.  Objective:   Vitals:   05/02/17 1523  BP: 125/77  Pulse: 98  Weight: 170 lb 3.2 oz (77.2 kg)    Fetal Status: Fetal Heart Rate (bpm): 134 Fundal Height: 33 cm Movement: Present     General:  Alert, oriented and cooperative. Patient is in no acute distress.  Skin: Skin is warm and dry. No rash noted.   Cardiovascular: Normal heart rate noted  Respiratory: Normal respiratory effort, no problems with respiration noted  Abdomen: Soft, gravid, appropriate for gestational age. Pain/Pressure: Present     Pelvic:  Cervical exam deferred        Extremities: Normal range of motion.  Edema: Trace  Mental Status: Normal mood and affect. Normal behavior. Normal judgment and thought content.   Assessment and Plan:  Pregnancy: G3P1011 at 4740w1d  1. Supervision of other normal pregnancy, antepartum     Doing well  2. History of pre-eclampsia in prior pregnancy, currently pregnant     Taking ASA OTC  3. Sickle cell trait (HCC)       4. Low vitamin D level     Taking weekly vitamin D.   5. Anemia affecting pregnancy in third trimester     Taking OTC iron.    Preterm labor  symptoms and general obstetric precautions including but not limited to vaginal bleeding, contractions, leaking of fluid and fetal movement were reviewed in detail with the patient. Please refer to After Visit Summary for other counseling recommendations.  Return in about 1 week (around 05/09/2017) for ROB, GBS.   Roe Coombsenney, Rachelle A, CNM

## 2017-05-14 ENCOUNTER — Ambulatory Visit (INDEPENDENT_AMBULATORY_CARE_PROVIDER_SITE_OTHER): Payer: Medicaid Other | Admitting: Certified Nurse Midwife

## 2017-05-14 ENCOUNTER — Encounter: Payer: Self-pay | Admitting: Certified Nurse Midwife

## 2017-05-14 VITALS — BP 116/70 | HR 79 | Wt 173.0 lb

## 2017-05-14 DIAGNOSIS — O09293 Supervision of pregnancy with other poor reproductive or obstetric history, third trimester: Secondary | ICD-10-CM

## 2017-05-14 DIAGNOSIS — Z3483 Encounter for supervision of other normal pregnancy, third trimester: Secondary | ICD-10-CM

## 2017-05-14 DIAGNOSIS — Z113 Encounter for screening for infections with a predominantly sexual mode of transmission: Secondary | ICD-10-CM

## 2017-05-14 DIAGNOSIS — O09299 Supervision of pregnancy with other poor reproductive or obstetric history, unspecified trimester: Secondary | ICD-10-CM

## 2017-05-14 DIAGNOSIS — Z3480 Encounter for supervision of other normal pregnancy, unspecified trimester: Secondary | ICD-10-CM

## 2017-05-14 DIAGNOSIS — E559 Vitamin D deficiency, unspecified: Secondary | ICD-10-CM

## 2017-05-14 DIAGNOSIS — R7989 Other specified abnormal findings of blood chemistry: Secondary | ICD-10-CM

## 2017-05-14 NOTE — Progress Notes (Signed)
   PRENATAL VISIT NOTE  Subjective:  Stacey Reid is a 21 y.o. G3P1011 at 5731w6d being seen today for ongoing prenatal care.  She is currently monitored for the following issues for this low-risk pregnancy and has History of pre-eclampsia in prior pregnancy, currently pregnant; Supervision of normal pregnancy, antepartum; Asthma exacerbation, mild; Sickle cell trait (HCC); Low vitamin D level; and Anemia affecting pregnancy on her problem list.  Patient reports no complaints.  Contractions: Irregular. Vag. Bleeding: None.  Movement: Present. Denies leaking of fluid.   The following portions of the patient's history were reviewed and updated as appropriate: allergies, current medications, past family history, past medical history, past social history, past surgical history and problem list. Problem list updated.  Objective:   Vitals:   05/14/17 1313  BP: 116/70  Pulse: 79  Weight: 173 lb (78.5 kg)    Fetal Status: Fetal Heart Rate (bpm): 152 Fundal Height: 35 cm Movement: Present  Presentation: Vertex  General:  Alert, oriented and cooperative. Patient is in no acute distress.  Skin: Skin is warm and dry. No rash noted.   Cardiovascular: Normal heart rate noted  Respiratory: Normal respiratory effort, no problems with respiration noted  Abdomen: Soft, gravid, appropriate for gestational age. Pain/Pressure: Present     Pelvic:  Cervical exam performed Dilation: Closed Effacement (%): 30 Station: -3  Extremities: Normal range of motion.     Mental Status: Normal mood and affect. Normal behavior. Normal judgment and thought content.   Assessment and Plan:  Pregnancy: G3P1011 at 6631w6d  1. Supervision of other normal pregnancy, antepartum     Doing well - Strep Gp B NAA - Cervicovaginal ancillary only  2. History of pre-eclampsia in prior pregnancy, currently pregnant     Was taking baby ASA  3. Low vitamin D level     Taking vitamin D liquid.   Preterm labor symptoms  and general obstetric precautions including but not limited to vaginal bleeding, contractions, leaking of fluid and fetal movement were reviewed in detail with the patient. Please refer to After Visit Summary for other counseling recommendations.  Return in about 1 week (around 05/21/2017) for ROB.   Roe Coombsachelle A Voyd Groft, CNM

## 2017-05-14 NOTE — Progress Notes (Signed)
Pt states that she has been having a lot of pelvic pain.

## 2017-05-15 LAB — CERVICOVAGINAL ANCILLARY ONLY
BACTERIAL VAGINITIS: NEGATIVE
Candida vaginitis: NEGATIVE
Chlamydia: NEGATIVE
Neisseria Gonorrhea: NEGATIVE
Trichomonas: NEGATIVE

## 2017-05-16 LAB — STREP GP B NAA: Strep Gp B NAA: NEGATIVE

## 2017-05-19 ENCOUNTER — Inpatient Hospital Stay (HOSPITAL_COMMUNITY)
Admission: AD | Admit: 2017-05-19 | Discharge: 2017-05-19 | Disposition: A | Discharge status: Home / Self Care | Payer: Medicaid Other | Source: Ambulatory Visit | Attending: Obstetrics & Gynecology | Admitting: Obstetrics & Gynecology

## 2017-05-19 ENCOUNTER — Encounter (HOSPITAL_COMMUNITY): Payer: Self-pay | Admitting: *Deleted

## 2017-05-19 DIAGNOSIS — R110 Nausea: Secondary | ICD-10-CM | POA: Diagnosis not present

## 2017-05-19 DIAGNOSIS — O26893 Other specified pregnancy related conditions, third trimester: Secondary | ICD-10-CM | POA: Insufficient documentation

## 2017-05-19 DIAGNOSIS — O09299 Supervision of pregnancy with other poor reproductive or obstetric history, unspecified trimester: Secondary | ICD-10-CM

## 2017-05-19 DIAGNOSIS — O26899 Other specified pregnancy related conditions, unspecified trimester: Secondary | ICD-10-CM

## 2017-05-19 DIAGNOSIS — R7989 Other specified abnormal findings of blood chemistry: Secondary | ICD-10-CM | POA: Diagnosis present

## 2017-05-19 DIAGNOSIS — D573 Sickle-cell trait: Secondary | ICD-10-CM | POA: Diagnosis present

## 2017-05-19 DIAGNOSIS — R51 Headache: Secondary | ICD-10-CM

## 2017-05-19 DIAGNOSIS — R1013 Epigastric pain: Secondary | ICD-10-CM | POA: Insufficient documentation

## 2017-05-19 DIAGNOSIS — Z3A36 36 weeks gestation of pregnancy: Secondary | ICD-10-CM | POA: Insufficient documentation

## 2017-05-19 DIAGNOSIS — R200 Anesthesia of skin: Secondary | ICD-10-CM | POA: Insufficient documentation

## 2017-05-19 DIAGNOSIS — R109 Unspecified abdominal pain: Secondary | ICD-10-CM | POA: Diagnosis not present

## 2017-05-19 DIAGNOSIS — O9989 Other specified diseases and conditions complicating pregnancy, childbirth and the puerperium: Secondary | ICD-10-CM | POA: Diagnosis not present

## 2017-05-19 DIAGNOSIS — R197 Diarrhea, unspecified: Secondary | ICD-10-CM | POA: Diagnosis not present

## 2017-05-19 DIAGNOSIS — J45901 Unspecified asthma with (acute) exacerbation: Secondary | ICD-10-CM | POA: Diagnosis present

## 2017-05-19 DIAGNOSIS — Z3490 Encounter for supervision of normal pregnancy, unspecified, unspecified trimester: Secondary | ICD-10-CM

## 2017-05-19 LAB — COMPREHENSIVE METABOLIC PANEL
ALK PHOS: 117 U/L (ref 38–126)
ALT: 11 U/L — AB (ref 14–54)
AST: 20 U/L (ref 15–41)
Albumin: 2.8 g/dL — ABNORMAL LOW (ref 3.5–5.0)
Anion gap: 7 (ref 5–15)
BILIRUBIN TOTAL: 0.6 mg/dL (ref 0.3–1.2)
CALCIUM: 8.7 mg/dL — AB (ref 8.9–10.3)
CO2: 22 mmol/L (ref 22–32)
CREATININE: 0.41 mg/dL — AB (ref 0.44–1.00)
Chloride: 106 mmol/L (ref 101–111)
Glucose, Bld: 80 mg/dL (ref 65–99)
Potassium: 3.8 mmol/L (ref 3.5–5.1)
Sodium: 135 mmol/L (ref 135–145)
Total Protein: 6.4 g/dL — ABNORMAL LOW (ref 6.5–8.1)

## 2017-05-19 LAB — URINALYSIS, ROUTINE W REFLEX MICROSCOPIC
BILIRUBIN URINE: NEGATIVE
Glucose, UA: NEGATIVE mg/dL
Hgb urine dipstick: NEGATIVE
Ketones, ur: NEGATIVE mg/dL
Nitrite: NEGATIVE
PH: 6 (ref 5.0–8.0)
Protein, ur: NEGATIVE mg/dL
SPECIFIC GRAVITY, URINE: 1.008 (ref 1.005–1.030)

## 2017-05-19 LAB — CBC
HEMATOCRIT: 29.5 % — AB (ref 36.0–46.0)
Hemoglobin: 10.1 g/dL — ABNORMAL LOW (ref 12.0–15.0)
MCH: 28.8 pg (ref 26.0–34.0)
MCHC: 34.2 g/dL (ref 30.0–36.0)
MCV: 84 fL (ref 78.0–100.0)
PLATELETS: 307 10*3/uL (ref 150–400)
RBC: 3.51 MIL/uL — AB (ref 3.87–5.11)
RDW: 12.6 % (ref 11.5–15.5)
WBC: 7.5 10*3/uL (ref 4.0–10.5)

## 2017-05-19 LAB — PROTEIN / CREATININE RATIO, URINE
Creatinine, Urine: 77 mg/dL
Protein Creatinine Ratio: 0.12 mg/mg{Cre} (ref 0.00–0.15)
Total Protein, Urine: 9 mg/dL

## 2017-05-19 MED ORDER — BUTALBITAL-APAP-CAFFEINE 50-325-40 MG PO CAPS: 1.0000 | ORAL_CAPSULE | Freq: Four times a day (QID) | ORAL | 3 refills | Status: DC | PRN

## 2017-05-19 MED ORDER — TRAMADOL 5 MG/ML ORAL SUSPENSION: 25.0000 mg | Freq: Once | ORAL | Status: DC

## 2017-05-19 NOTE — Discharge Instructions (Signed)
General Headache Without Cause A headache is pain or discomfort felt around the head or neck area. The specific cause of a headache may not be found. There are many causes and types of headaches. A few common ones are:  Tension headaches.  Migraine headaches.  Cluster headaches.  Chronic daily headaches.  Follow these instructions at home: Watch your condition for any changes. Take these steps to help with your condition: Managing pain  Take over-the-counter and prescription medicines only as told by your health care provider.  Lie down in a dark, quiet room when you have a headache.  If directed, apply ice to the head and neck area: ? Put ice in a plastic bag. ? Place a towel between your skin and the bag. ? Leave the ice on for 20 minutes, 2-3 times per day.  Use a heating pad or hot shower to apply heat to the head and neck area as told by your health care provider.  Keep lights dim if bright lights bother you or make your headaches worse. Eating and drinking  Eat meals on a regular schedule.  Limit alcohol use.  Decrease the amount of caffeine you drink, or stop drinking caffeine. General instructions  Keep all follow-up visits as told by your health care provider. This is important.  Keep a headache journal to help find out what may trigger your headaches. For example, write down: ? What you eat and drink. ? How much sleep you get. ? Any change to your diet or medicines.  Try massage or other relaxation techniques.  Limit stress.  Sit up straight, and do not tense your muscles.  Do not use tobacco products, including cigarettes, chewing tobacco, or e-cigarettes. If you need help quitting, ask your health care provider.  Exercise regularly as told by your health care provider.  Sleep on a regular schedule. Get 7-9 hours of sleep, or the amount recommended by your health care provider. Contact a health care provider if:  Your symptoms are not helped by  medicine.  You have a headache that is different from the usual headache.  You have nausea or you vomit.  You have a fever. Get help right away if:  Your headache becomes severe.  You have repeated vomiting.  You have a stiff neck.  You have a loss of vision.  You have problems with speech.  You have pain in the eye or ear.  You have muscular weakness or loss of muscle control.  You lose your balance or have trouble walking.  You feel faint or pass out.  You have confusion. This information is not intended to replace advice given to you by your health care provider. Make sure you discuss any questions you have with your health care provider. Document Released: 12/10/2005 Document Revised: 05/17/2016 Document Reviewed: 04/04/2015 [employer] Interactive Patient Education  2017 [employer]  

## 2017-05-19 NOTE — MAU Provider Note (Signed)
Chief Complaint:  Diarrhea and Abdominal Pain   First Provider Initiated Contact with Patient 05/19/17 1024      HPI: Stacey Reid is a 21 y.o. G3P1011 at [redacted]w[redacted]d who presents to maternity admissions reporting h/a, numbness and tingling of right arm/hand,epigastric/rib pain, and abdominal pain and loose stools.  She had preeclampsia in her previous pregnancy and was concerned that this could be happening again. She reports daily right sided h/a associated with nausea, usually resolved by Tylenol but not resolved after Tylenol dose this morning.  She has new onset tingling of her right arm today and it feels "heavy".  Her rib pain is in the right upper side and constant, starting this morning when she woke up. She has tried Tylenol at 2 am this morning which did not help her h/a or abdominal/rib pain.  She reports loose stool/diarrhea x 2 in 24 hours.   She reports good fetal movement, denies LOF, vaginal bleeding, vaginal itching/burning, urinary symptoms, dizziness, n/v, or fever/chills.    HPI  Past Medical History: Past Medical History:  Diagnosis Date  . Anemia affecting pregnancy 03/21/2017  . Asthma    years ago  . Dyspnea   . Hyperemesis 2016    Past obstetric history: OB History  Gravida Para Term Preterm AB Living  3 1 1  0 1 1  SAB TAB Ectopic Multiple Live Births  1 0 0 0 1    # Outcome Date GA Lbr Len/2nd Weight Sex Delivery Anes PTL Lv  3 Current           2 SAB 01/29/16 [redacted]w[redacted]d         1 Term 04/02/15 [redacted]w[redacted]d 413:16 / 01:08 5 lb 13.5 oz (2.651 kg) M Vag-Spont EPI, Local  LIV      Past Surgical History: Past Surgical History:  Procedure Laterality Date  . WISDOM TOOTH EXTRACTION      Family History: Family History  Problem Relation Age of Onset  . Asthma Mother   . Hypertension Mother   . Miscarriages / [place] Mother     Social History: Social History  Substance Use Topics  . Smoking status: Never Smoker  . Smokeless tobacco: Never Used  .  Alcohol use No    Allergies: No Known Allergies  Meds:  Prescriptions Prior to Admission  Medication Sig Dispense Refill Last Dose  . acetaminophen (TYLENOL) 160 MG/5ML suspension Take 320 mg by mouth every 6 (six) hours as needed for mild pain, moderate pain, fever or headache.    05/18/2017 at Unknown time  . albuterol (PROVENTIL HFA;VENTOLIN HFA) 108 (90 Base) MCG/ACT inhaler Inhale 2 puffs into the lungs every 6 (six) hours as needed for wheezing or shortness of breath. 18 g 1 05/18/2017 at Unknown time  . Prenatal Vit-Fe Fum-Fe Bisg-FA (NATACHEW) 28-1 MG CHEW Chew 1 tablet by mouth daily.   05/18/2017 at Unknown time    ROS:  Review of Systems  Constitutional: Negative for chills, fatigue and fever.  Eyes: Negative for visual disturbance.  Respiratory: Negative for shortness of breath.   Cardiovascular: Negative for chest pain.  Gastrointestinal: Positive for abdominal pain and diarrhea. Negative for nausea and vomiting.  Genitourinary: Negative for difficulty urinating, dysuria, flank pain, pelvic pain, vaginal bleeding, vaginal discharge and vaginal pain.  Neurological: Positive for headaches. Negative for dizziness.  Psychiatric/Behavioral: Negative.      I have reviewed patient's Past Medical Hx, Surgical Hx, Family Hx, Social Hx, medications and allergies.   Physical Exam  Patient  Vitals for the past 24 hrs:  BP Temp Temp src Pulse Resp Weight  05/19/17 1030 135/85 - - 94 18 -  05/19/17 1025 (!) 138/91 - - 93 18 -  05/19/17 0956 109/82 99.9 F (37.7 C) Oral 86 18 -  05/19/17 0944 (!) 107/59 98 F (36.7 C) - (!) 103 18 175 lb (79.4 kg)   Constitutional: Well-developed, well-nourished female in no acute distress.  Cardiovascular: normal rate Respiratory: normal effort GI: Abd soft, non-tender, gravid appropriate for gestational age.  MS: Extremities nontender, no edema, normal ROM Neurologic: Alert and oriented x 4.  GU: Neg CVAT.   Dilation: Fingertip Effacement  (%): 50 Cervical Position: Posterior Station: -3 Exam by:: l.leftwich-kirby, cnm  FHT:  Baseline 150 , moderate variability, accelerations present, no decelerations Contractions: irregular, mild to palpation   Labs: Results for orders placed or performed during the hospital encounter of 05/19/17 (from the past 24 hour(s))  Urinalysis, Routine w reflex microscopic     Status: Abnormal   Collection Time: 05/19/17  9:48 AM  Result Value Ref Range   Color, Urine YELLOW YELLOW   APPearance HAZY (A) CLEAR   Specific Gravity, Urine 1.008 1.005 - 1.030   pH 6.0 5.0 - 8.0   Glucose, UA NEGATIVE NEGATIVE mg/dL   Hgb urine dipstick NEGATIVE NEGATIVE   Bilirubin Urine NEGATIVE NEGATIVE   Ketones, ur NEGATIVE NEGATIVE mg/dL   Protein, ur NEGATIVE NEGATIVE mg/dL   Nitrite NEGATIVE NEGATIVE   Leukocytes, UA MODERATE (A) NEGATIVE   RBC / HPF 0-5 0 - 5 RBC/hpf   WBC, UA 6-30 0 - 5 WBC/hpf   Bacteria, UA MANY (A) NONE SEEN   Squamous Epithelial / LPF 6-30 (A) NONE SEEN   Mucous PRESENT    O/Positive/-- (11/13 1430)  Imaging:  No results found.  MAU Course/MDM: I have ordered labs and reviewed results.  NST reviewed and reactive Dr Jodi MarbleHarrraway-Smith assumed care of pt in MAU    Sharen CounterLisa Leftwich-Kirby Certified Nurse-Midwife 05/19/2017 10:39 AM   By the time I saw pt she was complaining only of a mild HA. She reports tha tshe has had HA with erh prev pregnancies. She took Tylenol x q for this HA. She was worried about preeclampsia which is why she came in. She denies RUQ pain. She is unable to take pills. She drove herself in today. When asked if she wanted meds here or to take home she wanted to take her meds a t home. She did not sleep well last pm and I recommended that she take a nap after taking her meds.   BP 119/70   Pulse 92   Temp 99.8 F (37.7 C) (Oral)   Resp 18   Wt 175 lb (79.4 kg)   LMP 09/05/2016   BMI 31.00 kg/m  Abd: gravid,; NT, ND, specifically no RUQ  tenderness Ext: no edema.  All PIH labs were wnl  Ok for discharge to home.  F/u on Tues as scheduled for ROB appt F/u sooner prn s/sx of preeclampsia Of note: pt has not been taking her her baby asa regularly. I have stressed to her the importance of taking her meds  Wayman Hoard L. Erin FullingHarraway-Smith, M.D., FACOG .

## 2017-05-19 NOTE — Progress Notes (Deleted)
Discharge instructions reviewed with pt. Pt and significant other state they understand when to return to the hospital and to follow up with the OB on the scheduled appointment on Thursday. All questions answered. Pt escorted to exit by RN

## 2017-05-19 NOTE — MAU Note (Signed)
Pt states lingering headache for 3 days unrelieved by tylenol, loose stools, upper right abdominal pain, and heavy arms. Denies leaking of fluid, vaginal bleeding, but has mild contractions.

## 2017-05-19 NOTE — MAU Note (Addendum)
Pt presents to MAU with complaints of discomfort in the right upper part of her abdomen. Denies any VB or LOF. Reports loose stools 2-3 times a day. Pt also complains of tingling in her arms and legs

## 2017-05-21 ENCOUNTER — Ambulatory Visit (INDEPENDENT_AMBULATORY_CARE_PROVIDER_SITE_OTHER): Payer: Medicaid Other | Admitting: Obstetrics and Gynecology

## 2017-05-21 VITALS — BP 121/73 | HR 84 | Wt 176.8 lb

## 2017-05-21 DIAGNOSIS — Z3483 Encounter for supervision of other normal pregnancy, third trimester: Secondary | ICD-10-CM

## 2017-05-21 DIAGNOSIS — O09299 Supervision of pregnancy with other poor reproductive or obstetric history, unspecified trimester: Secondary | ICD-10-CM

## 2017-05-21 DIAGNOSIS — Z3480 Encounter for supervision of other normal pregnancy, unspecified trimester: Secondary | ICD-10-CM

## 2017-05-21 DIAGNOSIS — O09293 Supervision of pregnancy with other poor reproductive or obstetric history, third trimester: Secondary | ICD-10-CM

## 2017-05-21 NOTE — Progress Notes (Signed)
Patient complains of difficulty breathing and she is using her inhaler more often. Patient had contractions yesterday and wants to be checked

## 2017-05-21 NOTE — Patient Instructions (Signed)

## 2017-05-21 NOTE — Progress Notes (Signed)
Subjective:  Stacey Reid is a 21 y.o. G3P1011 at 7193w6d being seen today for ongoing prenatal care.  She is currently monitored for the following issues for this low-risk pregnancy and has History of pre-eclampsia in prior pregnancy, currently pregnant; Supervision of normal pregnancy, antepartum; Asthma exacerbation, mild; Sickle cell trait (HCC); Low vitamin D level; and Anemia affecting pregnancy on her problem list.  Patient reports feels SOB at times. No wheezeing. H/O asthma. Has been using MDI. Marland Kitchen.  Contractions: Irregular. Vag. Bleeding: None.  Movement: Present. Denies leaking of fluid.   The following portions of the patient's history were reviewed and updated as appropriate: allergies, current medications, past family history, past medical history, past social history, past surgical history and problem list. Problem list updated.  Objective:   Vitals:   05/21/17 1449  BP: 121/73  Pulse: 84  Weight: 176 lb 12.8 oz (80.2 kg)    Fetal Status: Fetal Heart Rate (bpm): 138   Movement: Present     General:  Alert, oriented and cooperative. Patient is in no acute distress.  Skin: Skin is warm and dry. No rash noted.   Cardiovascular: Normal heart rate noted  Respiratory: Normal respiratory effort, no problems with respiration noted  Abdomen: Soft, gravid, appropriate for gestational age. Pain/Pressure: Present     Pelvic:  Cervical exam performed        Extremities: Normal range of motion.  Edema: Trace  Mental Status: Normal mood and affect. Normal behavior. Normal judgment and thought content.   Urinalysis:      Assessment and Plan:  Pregnancy: G3P1011 at 6393w6d  1. Supervision of other normal pregnancy, antepartum Labor precautions - GC/Chlamydia Probe Amp Explained to pt the use of her MDI for asthma. SOB most likely related to pregnancy. Exam today, lungs clear 2. History of pre-eclampsia in prior pregnancy, currently pregnant   Term labor symptoms and general  obstetric precautions including but not limited to vaginal bleeding, contractions, leaking of fluid and fetal movement were reviewed in detail with the patient. Please refer to After Visit Summary for other counseling recommendations.  Return in about 1 week (around 05/28/2017) for OB visit.   Hermina StaggersErvin, Kaeo Jacome L, MD

## 2017-05-21 NOTE — Addendum Note (Signed)
Addended by: Dalphine HandingGARDNER, Lynett Brasil L on: 05/21/2017 04:45 PM   Modules accepted: Orders

## 2017-05-23 ENCOUNTER — Telehealth: Payer: Self-pay

## 2017-05-23 NOTE — Telephone Encounter (Signed)
Pt called to state that she is having some swelling, and seeing black spots. Pt advised to go to MAU asap for evaluation.

## 2017-05-28 ENCOUNTER — Encounter (HOSPITAL_COMMUNITY): Payer: Self-pay | Admitting: *Deleted

## 2017-05-28 ENCOUNTER — Encounter: Payer: Self-pay | Admitting: Certified Nurse Midwife

## 2017-05-28 ENCOUNTER — Ambulatory Visit (INDEPENDENT_AMBULATORY_CARE_PROVIDER_SITE_OTHER): Payer: Medicaid Other | Admitting: Certified Nurse Midwife

## 2017-05-28 ENCOUNTER — Inpatient Hospital Stay (HOSPITAL_COMMUNITY)
Admission: AD | Admit: 2017-05-28 | Discharge: 2017-05-30 | DRG: 775 | Disposition: A | Discharge status: Home / Self Care | Payer: Medicaid Other | Source: Ambulatory Visit | Attending: Family Medicine | Admitting: Family Medicine

## 2017-05-28 VITALS — BP 147/91 | HR 96 | Wt 178.4 lb

## 2017-05-28 DIAGNOSIS — Z3490 Encounter for supervision of normal pregnancy, unspecified, unspecified trimester: Secondary | ICD-10-CM | POA: Diagnosis present

## 2017-05-28 DIAGNOSIS — D573 Sickle-cell trait: Secondary | ICD-10-CM | POA: Diagnosis present

## 2017-05-28 DIAGNOSIS — O163 Unspecified maternal hypertension, third trimester: Secondary | ICD-10-CM

## 2017-05-28 DIAGNOSIS — Z3A37 37 weeks gestation of pregnancy: Secondary | ICD-10-CM

## 2017-05-28 DIAGNOSIS — J45909 Unspecified asthma, uncomplicated: Secondary | ICD-10-CM | POA: Diagnosis present

## 2017-05-28 DIAGNOSIS — O09299 Supervision of pregnancy with other poor reproductive or obstetric history, unspecified trimester: Secondary | ICD-10-CM

## 2017-05-28 DIAGNOSIS — Z3483 Encounter for supervision of other normal pregnancy, third trimester: Secondary | ICD-10-CM

## 2017-05-28 DIAGNOSIS — O1404 Mild to moderate pre-eclampsia, complicating childbirth: Secondary | ICD-10-CM | POA: Diagnosis present

## 2017-05-28 DIAGNOSIS — O134 Gestational [pregnancy-induced] hypertension without significant proteinuria, complicating childbirth: Secondary | ICD-10-CM | POA: Diagnosis present

## 2017-05-28 DIAGNOSIS — Z3480 Encounter for supervision of other normal pregnancy, unspecified trimester: Secondary | ICD-10-CM

## 2017-05-28 DIAGNOSIS — O9902 Anemia complicating childbirth: Secondary | ICD-10-CM | POA: Diagnosis present

## 2017-05-28 DIAGNOSIS — R7989 Other specified abnormal findings of blood chemistry: Secondary | ICD-10-CM

## 2017-05-28 DIAGNOSIS — J45901 Unspecified asthma with (acute) exacerbation: Secondary | ICD-10-CM

## 2017-05-28 DIAGNOSIS — E559 Vitamin D deficiency, unspecified: Secondary | ICD-10-CM

## 2017-05-28 DIAGNOSIS — O99013 Anemia complicating pregnancy, third trimester: Secondary | ICD-10-CM

## 2017-05-28 DIAGNOSIS — O9952 Diseases of the respiratory system complicating childbirth: Secondary | ICD-10-CM | POA: Diagnosis present

## 2017-05-28 HISTORY — DX: Gestational (pregnancy-induced) hypertension without significant proteinuria, unspecified trimester: O13.9

## 2017-05-28 HISTORY — DX: Essential (primary) hypertension: I10

## 2017-05-28 LAB — URINALYSIS, ROUTINE W REFLEX MICROSCOPIC
Bilirubin Urine: NEGATIVE
GLUCOSE, UA: NEGATIVE mg/dL
Hgb urine dipstick: NEGATIVE
KETONES UR: NEGATIVE mg/dL
Leukocytes, UA: NEGATIVE
Nitrite: NEGATIVE
Protein, ur: NEGATIVE mg/dL
Specific Gravity, Urine: 1.003 — ABNORMAL LOW (ref 1.005–1.030)
pH: 6 (ref 5.0–8.0)

## 2017-05-28 LAB — CBC
HEMATOCRIT: 27.6 % — AB (ref 36.0–46.0)
Hemoglobin: 9.5 g/dL — ABNORMAL LOW (ref 12.0–15.0)
MCH: 28 pg (ref 26.0–34.0)
MCHC: 34.4 g/dL (ref 30.0–36.0)
MCV: 81.4 fL (ref 78.0–100.0)
Platelets: 300 10*3/uL (ref 150–400)
RBC: 3.39 MIL/uL — AB (ref 3.87–5.11)
RDW: 12.6 % (ref 11.5–15.5)
WBC: 6.2 10*3/uL (ref 4.0–10.5)

## 2017-05-28 LAB — COMPREHENSIVE METABOLIC PANEL
ALT: 11 U/L — ABNORMAL LOW (ref 14–54)
AST: 21 U/L (ref 15–41)
Albumin: 2.8 g/dL — ABNORMAL LOW (ref 3.5–5.0)
Alkaline Phosphatase: 118 U/L (ref 38–126)
Anion gap: 8 (ref 5–15)
BUN: <5 mg/dL — ABNORMAL LOW (ref 6–20)
CHLORIDE: 107 mmol/L (ref 101–111)
CO2: 21 mmol/L — ABNORMAL LOW (ref 22–32)
CREATININE: 0.48 mg/dL (ref 0.44–1.00)
Calcium: 8.2 mg/dL — ABNORMAL LOW (ref 8.9–10.3)
Glucose, Bld: 91 mg/dL (ref 65–99)
Potassium: 3.4 mmol/L — ABNORMAL LOW (ref 3.5–5.1)
Sodium: 136 mmol/L (ref 135–145)
TOTAL PROTEIN: 6.5 g/dL (ref 6.5–8.1)
Total Bilirubin: 0.5 mg/dL (ref 0.3–1.2)

## 2017-05-28 LAB — TYPE AND SCREEN
ABO/RH(D): O POS
ANTIBODY SCREEN: NEGATIVE

## 2017-05-28 LAB — PROTEIN / CREATININE RATIO, URINE
Creatinine, Urine: 47 mg/dL
Total Protein, Urine: <6 mg/dL

## 2017-05-28 MED ORDER — MISOPROSTOL 25 MCG QUARTER TABLET
25.0000 ug | ORAL_TABLET | ORAL | Status: DC | PRN
  Administered 2017-05-28 (×2): 25 ug via VAGINAL
  Filled 2017-05-28 (×3): qty 1

## 2017-05-28 MED ORDER — OXYCODONE-ACETAMINOPHEN 5-325 MG PO TABS: 2.0000 | ORAL_TABLET | ORAL | Status: DC | PRN

## 2017-05-28 MED ORDER — FENTANYL CITRATE (PF) 100 MCG/2ML IJ SOLN
100.0000 ug | INTRAMUSCULAR | Status: DC | PRN
  Administered 2017-05-28 – 2017-05-29 (×3): 100 ug via INTRAVENOUS
  Filled 2017-05-28 (×2): qty 2

## 2017-05-28 MED ORDER — FENTANYL CITRATE (PF) 100 MCG/2ML IJ SOLN
INTRAMUSCULAR | Status: AC
  Administered 2017-05-28: 100 ug via INTRAVENOUS
  Filled 2017-05-28: qty 2

## 2017-05-28 MED ORDER — ACETAMINOPHEN 325 MG PO TABS: 650.0000 mg | ORAL_TABLET | ORAL | Status: DC | PRN

## 2017-05-28 MED ORDER — BUTALBITAL-APAP-CAFFEINE 50-325-40 MG PO TABS
1.0000 | ORAL_TABLET | Freq: Once | ORAL | Status: DC
  Filled 2017-05-28: qty 1

## 2017-05-28 MED ORDER — LIDOCAINE HCL (PF) 1 % IJ SOLN
30.0000 mL | INTRAMUSCULAR | Status: DC | PRN
  Filled 2017-05-28: qty 30

## 2017-05-28 MED ORDER — SOD CITRATE-CITRIC ACID 500-334 MG/5ML PO SOLN: 30.0000 mL | ORAL | Status: DC | PRN

## 2017-05-28 MED ORDER — LACTATED RINGERS IV SOLN
INTRAVENOUS | Status: DC
  Administered 2017-05-28 – 2017-05-29 (×2): via INTRAVENOUS

## 2017-05-28 MED ORDER — OXYTOCIN BOLUS FROM INFUSION
500.0000 mL | Freq: Once | INTRAVENOUS | Status: AC
  Administered 2017-05-29: 500 mL via INTRAVENOUS

## 2017-05-28 MED ORDER — FLEET ENEMA 7-19 GM/118ML RE ENEM: 1.0000 | ENEMA | Freq: Every day | RECTAL | Status: DC | PRN

## 2017-05-28 MED ORDER — LACTATED RINGERS IV SOLN: 500.0000 mL | INTRAVENOUS | Status: DC | PRN

## 2017-05-28 MED ORDER — ONDANSETRON HCL 4 MG/2ML IJ SOLN: 4.0000 mg | Freq: Four times a day (QID) | INTRAMUSCULAR | Status: DC | PRN

## 2017-05-28 MED ORDER — LABETALOL HCL 5 MG/ML IV SOLN: 20.0000 mg | INTRAVENOUS | Status: DC | PRN

## 2017-05-28 MED ORDER — HYDRALAZINE HCL 20 MG/ML IJ SOLN: 10.0000 mg | Freq: Once | INTRAMUSCULAR | Status: DC | PRN

## 2017-05-28 MED ORDER — OXYTOCIN 40 UNITS IN LACTATED RINGERS INFUSION - SIMPLE MED
2.5000 [IU]/h | INTRAVENOUS | Status: DC
  Filled 2017-05-28: qty 1000

## 2017-05-28 MED ORDER — ACETAMINOPHEN 160 MG/5ML PO SOLN
650.0000 mg | ORAL | Status: DC | PRN
  Administered 2017-05-28: 650 mg via ORAL
  Filled 2017-05-28: qty 20.3

## 2017-05-28 MED ORDER — TERBUTALINE SULFATE 1 MG/ML IJ SOLN: 0.2500 mg | Freq: Once | INTRAMUSCULAR | Status: DC | PRN

## 2017-05-28 MED ORDER — OXYCODONE-ACETAMINOPHEN 5-325 MG PO TABS
1.0000 | ORAL_TABLET | ORAL | Status: DC | PRN
Start: 2017-05-28 — End: 2017-05-29

## 2017-05-28 NOTE — MAU Note (Signed)
Sent form OB's office for further evaluation of BP; hx of preeclampsia with 1st pregnancy;

## 2017-05-28 NOTE — MAU Provider Note (Signed)
Chief Complaint:  Headache and Blurred Vision   HPI: Stacey Reid is a 21 y.o. G3P1011 at [redacted]w[redacted]d who presents to maternity admissions reporting  . Went to office 2/2 headache, had elevated BPs in office today in 140s/90s and on repeat, sent in for evaluation for preeclampsia. No blurred vision today, had headache but it is gone. Has frequent headaches. H/o preeclampsia last pregnancy.   Denies contractions, leakage of fluid or vaginal bleeding. Good fetal movement.   Pregnancy Course:   Past Medical History: Past Medical History:  Diagnosis Date  . Anemia affecting pregnancy 03/21/2017  . Asthma    years ago  . Dyspnea   . Hyperemesis 2016  . Hypertension   . Pregnancy induced hypertension     Past obstetric history: OB History  Gravida Para Term Preterm AB Living  3 1 1  0 1 1  SAB TAB Ectopic Multiple Live Births  1 0 0 0 1    # Outcome Date GA Lbr Len/2nd Weight Sex Delivery Anes PTL Lv  3 Current           2 SAB 01/29/16 [redacted]w[redacted]d         1 Term 04/02/15 [redacted]w[redacted]d 413:16 / 01:08 5 lb 13.5 oz (2.651 kg) M Vag-Spont EPI, Local  LIV      Past Surgical History: Past Surgical History:  Procedure Laterality Date  . WISDOM TOOTH EXTRACTION       Family History: Family History  Problem Relation Age of Onset  . Asthma Mother   . Hypertension Mother   . Miscarriages / [place] Mother     Social History: Social History  Substance Use Topics  . Smoking status: Never Smoker  . Smokeless tobacco: Never Used  . Alcohol use No    Allergies: No Known Allergies  Meds:  Prescriptions Prior to Admission  Medication Sig Dispense Refill Last Dose  . acetaminophen (TYLENOL) 160 MG/5ML suspension Take 320 mg by mouth every 6 (six) hours as needed for mild pain, moderate pain, fever or headache.    Taking  . albuterol (PROVENTIL HFA;VENTOLIN HFA) 108 (90 Base) MCG/ACT inhaler Inhale 2 puffs into the lungs every 6 (six) hours as needed for wheezing or shortness of breath.  18 g 1 Taking  . Butalbital-APAP-Caffeine 50-325-40 MG capsule Take 1-2 capsules by mouth every 6 (six) hours as needed for headache. 30 capsule 3 Taking  . Prenatal Vit-Fe Fum-Fe Bisg-FA (NATACHEW) 28-1 MG CHEW Chew 1 tablet by mouth daily.   Taking    I have reviewed patient's Past Medical Hx, Surgical Hx, Family Hx, Social Hx, medications and allergies.   ROS:  A comprehensive ROS was negative except per HPI.    Physical Exam  Patient Vitals for the past 24 hrs:  BP Temp Pulse Resp Weight  05/28/17 1247 (!) 128/93 - 83 - -  05/28/17 1231 115/78 - 98 - -  05/28/17 1217 115/80 - 87 - -  05/28/17 1202 130/82 - 89 - -  05/28/17 1159 126/74 - 88 - -  05/28/17 1139 134/80 98.2 F (36.8 C) 93 18 179 lb (81.2 kg)   Constitutional: Well-developed, well-nourished female in no acute distress.  Cardiovascular: normal rate Respiratory: normal effort GI: Abd soft, non-tender, gravid appropriate for gestational age. Pos BS x 4 MS: Extremities nontender, no edema, normal ROM Neurologic: Alert and oriented x 4.  GU: Neg CVAT. Pelvic: NEFG, physiologic discharge, no blood, cervix clean. No CMT     Labs: Results for orders placed  or performed during the hospital encounter of 05/28/17 (from the past 24 hour(s))  Urinalysis, Routine w reflex microscopic     Status: Abnormal   Collection Time: 05/28/17 11:40 AM  Result Value Ref Range   Color, Urine YELLOW YELLOW   APPearance HAZY (A) CLEAR   Specific Gravity, Urine 1.003 (L) 1.005 - 1.030   pH 6.0 5.0 - 8.0   Glucose, UA NEGATIVE NEGATIVE mg/dL   Hgb urine dipstick NEGATIVE NEGATIVE   Bilirubin Urine NEGATIVE NEGATIVE   Ketones, ur NEGATIVE NEGATIVE mg/dL   Protein, ur NEGATIVE NEGATIVE mg/dL   Nitrite NEGATIVE NEGATIVE   Leukocytes, UA NEGATIVE NEGATIVE  Protein / creatinine ratio, urine     Status: None   Collection Time: 05/28/17 11:40 AM  Result Value Ref Range   Creatinine, Urine 47.00 mg/dL   Total Protein, Urine <6 mg/dL    Protein Creatinine Ratio        0.00 - 0.15 mg/mg[Cre]  Comprehensive metabolic panel     Status: Abnormal (Preliminary result)   Collection Time: 05/28/17 12:16 PM  Result Value Ref Range   Sodium 136 135 - 145 mmol/L   Potassium 3.4 (L) 3.5 - 5.1 mmol/L   Chloride 107 101 - 111 mmol/L   CO2 21 (L) 22 - 32 mmol/L   Glucose, Bld 91 65 - 99 mg/dL   BUN PENDING 6 - 20 mg/dL   Creatinine, Ser 6.960.48 0.44 - 1.00 mg/dL   Calcium 8.2 (L) 8.9 - 10.3 mg/dL   Total Protein 6.5 6.5 - 8.1 g/dL   Albumin 2.8 (L) 3.5 - 5.0 g/dL   AST 21 15 - 41 U/L   ALT 11 (L) 14 - 54 U/L   Alkaline Phosphatase 118 38 - 126 U/L   Total Bilirubin 0.5 0.3 - 1.2 mg/dL   GFR calc non Af Amer >60 >60 mL/min   GFR calc Af Amer >60 >60 mL/min   Anion gap 8 5 - 15  CBC     Status: Abnormal   Collection Time: 05/28/17 12:16 PM  Result Value Ref Range   WBC 6.2 4.0 - 10.5 K/uL   RBC 3.39 (L) 3.87 - 5.11 MIL/uL   Hemoglobin 9.5 (L) 12.0 - 15.0 g/dL   HCT 29.527.6 (L) 28.436.0 - 13.246.0 %   MCV 81.4 78.0 - 100.0 fL   MCH 28.0 26.0 - 34.0 pg   MCHC 34.4 30.0 - 36.0 g/dL   RDW 44.012.6 10.211.5 - 72.515.5 %   Platelets 300 150 - 400 K/uL    Imaging:  No results found.  MAU Course: Monitored for vitals, elevated BPs 4 hours apart. PIH labs normal NST reactive  MDM: Plan of care reviewed with patient, including labs and tests ordered and medical treatment. Discussed induction of labor due to new diagnosis of GHTN.   Assessment/Plan: Gestational Hypertension - Sent to L&D for IOL  Jen MowElizabeth Darald Uzzle, DO OB Fellow [facility] 05/28/2017 12:56 PM

## 2017-05-28 NOTE — MAU Note (Signed)
Pt sent from physician's office for increase in blood pressure. Complains of headache and blurred vision this morning. Pt has hx of pre-e with first pregnancy

## 2017-05-28 NOTE — Progress Notes (Signed)
Patient reports good fetal movement and complains of headaches, blurred vision and contractions that come and go.

## 2017-05-28 NOTE — H&P (Signed)
LABOR AND DELIVERY ADMISSION HISTORY AND PHYSICAL NOTE  Stacey Reid is a 21 y.o. female G3P1011 with IUP at [redacted]w[redacted]d by LMP c/w 19w ultrasound presenting for IOL for gHTN.   Went to office 2/2 headache, had elevated BPs in office today in 140s/90s and on repeat, sent in for evaluation for preeclampsia which was negative. No blurred vision today, had headache but it is gone. Has frequent headaches. H/o preeclampsia last pregnancy.  Admission for IOL for gHTN  She reports +FM, + contractions, No LOF, no VB, no blurry vision, headaches or peripheral edema, and RUQ pain.  She plans on breast and bottle feeding. Desires tubal ligation plan for contraception.   Prenatal History/Complications:  Past Medical History: Past Medical History:  Diagnosis Date  . Anemia affecting pregnancy 03/21/2017  . Asthma    years ago  . Dyspnea   . Hyperemesis 2016  . Hypertension   . Pregnancy induced hypertension     Past Surgical History: Past Surgical History:  Procedure Laterality Date  . WISDOM TOOTH EXTRACTION      Obstetrical History: OB History    Gravida Para Term Preterm AB Living   3 1 1  0 1 1   SAB TAB Ectopic Multiple Live Births   1 0 0 0 1      Social History: Social History   Social History  . Marital status: Single    Spouse name: N/A  . Number of children: N/A  . Years of education: N/A   Social History Main Topics  . Smoking status: Never Smoker  . Smokeless tobacco: Never Used  . Alcohol use No  . Drug use: No  . Sexual activity: Yes    Birth control/ protection: None     Comment: i month ago   Other Topics Concern  . None   Social History Narrative  . None    Family History: Family History  Problem Relation Age of Onset  . Asthma Mother   . Hypertension Mother   . Miscarriages / [place] Mother     Allergies: No Known Allergies  Prescriptions Prior to Admission  Medication Sig Dispense Refill Last Dose  . acetaminophen (TYLENOL) 160  MG/5ML suspension Take 320 mg by mouth every 6 (six) hours as needed for mild pain, moderate pain, fever or headache.    05/27/2017 at Unknown time  . albuterol (PROVENTIL HFA;VENTOLIN HFA) 108 (90 Base) MCG/ACT inhaler Inhale 2 puffs into the lungs every 6 (six) hours as needed for wheezing or shortness of breath. 18 g 1 Past Week at Unknown time  . diphenhydrAMINE (BENADRYL) 25 MG tablet Take 25 mg by mouth every 6 (six) hours as needed for allergies.   Past Week at Unknown time  . Prenatal Vit-Fe Fum-Fe Bisg-FA (NATACHEW) 28-1 MG CHEW Chew 1 tablet by mouth daily.   Past Week at Unknown time  . Butalbital-APAP-Caffeine 50-325-40 MG capsule Take 1-2 capsules by mouth every 6 (six) hours as needed for headache. 30 capsule 3 prn     Review of Systems   All systems reviewed and negative except as stated in HPI  Physical Exam Blood pressure (!) 135/94, pulse 81, temperature 98.2 F (36.8 C), resp. rate 18, weight 81.2 kg (179 lb), last menstrual period 09/05/2016, unknown if currently breastfeeding.  Fetal monitoring: 140 bpm, mod var, accels no decels Uterine activity: q57m Dilation: 1 Effacement (%): 50 Exam by:: per OB's office SVE last week   Prenatal labs: ABO, Rh: O/Positive/-- (11/13 1430) Antibody: Negative (  11/13 1430) Rubella: immune RPR: Non Reactive (03/26 1020)  HBsAg: Negative (11/13 1430)  HIV: Non Reactive (03/26 1020)  GBS: Negative (05/22 1420)  1 hr Glucola: WNL Genetic screening:  AFP negative, NIPS normal Anatomy US: normal @19w   Prenatal Transfer Tool  Maternal Diabetes: No Genetic Screening: Normal Maternal Ultrasounds/Referrals: Normal Fetal Ultrasounds or other Referrals:  None Maternal Substance Abuse:  No Significant Maternal Medications:  None Significant Maternal Lab Results: Lab values include: Group B Strep negative  Results for orders placed or performed during the hospital encounter of 05/28/17 (from the past 24 hour(s))  Urinalysis, Routine w  reflex microscopic   Collection Time: 05/28/17 11:40 AM  Result Value Ref Range   Color, Urine YELLOW YELLOW   APPearance HAZY (A) CLEAR   Specific Gravity, Urine 1.003 (L) 1.005 - 1.030   pH 6.0 5.0 - 8.0   Glucose, UA NEGATIVE NEGATIVE mg/dL   Hgb urine dipstick NEGATIVE NEGATIVE   Bilirubin Urine NEGATIVE NEGATIVE   Ketones, ur NEGATIVE NEGATIVE mg/dL   Protein, ur NEGATIVE NEGATIVE mg/dL   Nitrite NEGATIVE NEGATIVE   Leukocytes, UA NEGATIVE NEGATIVE  Protein / creatinine ratio, urine   Collection Time: 05/28/17 11:40 AM  Result Value Ref Range   Creatinine, Urine 47.00 mg/dL   Total Protein, Urine <6 mg/dL   Protein Creatinine Ratio        0.00 - 0.15 mg/mg[Cre]  Comprehensive metabolic panel   Collection Time: 05/28/17 12:16 PM  Result Value Ref Range   Sodium 136 135 - 145 mmol/L   Potassium 3.4 (L) 3.5 - 5.1 mmol/L   Chloride 107 101 - 111 mmol/L   CO2 21 (L) 22 - 32 mmol/L   Glucose, Bld 91 65 - 99 mg/dL   BUN <5 (L) 6 - 20 mg/dL   Creatinine, Ser 0.98 0.44 - 1.00 mg/dL   Calcium 8.2 (L) 8.9 - 10.3 mg/dL   Total Protein 6.5 6.5 - 8.1 g/dL   Albumin 2.8 (L) 3.5 - 5.0 g/dL   AST 21 15 - 41 U/L   ALT 11 (L) 14 - 54 U/L   Alkaline Phosphatase 118 38 - 126 U/L   Total Bilirubin 0.5 0.3 - 1.2 mg/dL   GFR calc non Af Amer >60 >60 mL/min   GFR calc Af Amer >60 >60 mL/min   Anion gap 8 5 - 15  CBC   Collection Time: 05/28/17 12:16 PM  Result Value Ref Range   WBC 6.2 4.0 - 10.5 K/uL   RBC 3.39 (L) 3.87 - 5.11 MIL/uL   Hemoglobin 9.5 (L) 12.0 - 15.0 g/dL   HCT 11.9 (L) 14.7 - 82.9 %   MCV 81.4 78.0 - 100.0 fL   MCH 28.0 26.0 - 34.0 pg   MCHC 34.4 30.0 - 36.0 g/dL   RDW 56.2 13.0 - 86.5 %   Platelets 300 150 - 400 K/uL    Patient Active Problem List   Diagnosis Date Noted  . Anemia affecting pregnancy 03/21/2017  . Sickle cell trait (HCC) 11/13/2016  . Low vitamin D level 11/13/2016  . Supervision of normal pregnancy, antepartum 11/05/2016  . Asthma  exacerbation, mild 11/05/2016  . History of pre-eclampsia in prior pregnancy, currently pregnant 03/31/2015    Assessment: Stacey Reid is a 21 y.o. G3P1011 at [redacted]w[redacted]d here for IOL For gHTN  #Labor: start with cytotec/foley #Pain: Planning for epidural #FWB: category 1 #ID:  GBS negative #MOF: breast and bottle #MOC: wants BTL but papers not yet  signed, will perform as interval BTL #Circ:  N/A  Howard PouchLauren Feng, MD PGY-1 [facility]  05/28/2017, 3:04 PM  OB FELLOW HISTORY AND PHYSICAL ATTESTATION  I have seen and examined this patient; I agree with above documentation in the resident's note.    Jen MowElizabeth Faren Florence, DO OB Fellow 05/28/2017, 8:52 PM

## 2017-05-28 NOTE — Progress Notes (Signed)
   PRENATAL VISIT NOTE  Subjective:  Stacey Reid is a 21 y.o. G3P1011 at 6365w6d being seen today for ongoing prenatal care.  She is currently monitored for the following issues for this low-risk pregnancy and has History of pre-eclampsia in prior pregnancy, currently pregnant; Supervision of normal pregnancy, antepartum; Asthma exacerbation, mild; Sickle cell trait (HCC); Low vitamin D level; and Anemia affecting pregnancy on her problem list.  Patient reports headache, no bleeding, no leaking, occasional contractions and edema in extremities since the weekend with elevated blood pressures reported at home.  Contractions: Irregular. Vag. Bleeding: None.  Movement: Present. Denies leaking of fluid.   The following portions of the patient's history were reviewed and updated as appropriate: allergies, current medications, past family history, past medical history, past social history, past surgical history and problem list. Problem list updated.  Objective:   Vitals:   05/28/17 1051 05/28/17 1057  BP: (!) 144/85 (!) 147/91  Pulse: (!) 101 96  Weight: 178 lb 6.4 oz (80.9 kg)     Fetal Status: Fetal Heart Rate (bpm): 145 Fundal Height: 38 cm Movement: Present     General:  Alert, oriented and cooperative. Patient is in no acute distress.  Skin: Skin is warm and dry. No rash noted.   Cardiovascular: Normal heart rate noted  Respiratory: Normal respiratory effort, no problems with respiration noted  Abdomen: Soft, gravid, appropriate for gestational age. Pain/Pressure: Present     Pelvic:  Cervical exam deferred        Extremities: Normal range of motion.  Edema: Trace  Mental Status: Normal mood and affect. Normal behavior. Normal judgment and thought content.   Assessment and Plan:  Pregnancy: G3P1011 at 7065w6d  1. Supervision of other normal pregnancy, antepartum     Elevated blood pressure today with hx of PIH  2. Low vitamin D level    Taking weekly vitamin D  3. Anemia  affecting pregnancy in third trimester     Taking OTC Iron  4. History of pre-eclampsia in prior pregnancy, currently pregnant     Was on baby ASA  5. Asthma exacerbation, mild     Has inhaler at home.   6. Elevated blood pressure complicating pregnancy, antepartum, third trimester     Labs cancelled d/t consistent HA after Tylenol and Fioricet.    Term labor symptoms and general obstetric precautions including but not limited to vaginal bleeding, contractions, leaking of fluid and fetal movement were reviewed in detail with the patient. Please refer to After Visit Summary for other counseling recommendations.  Return in about 1 week (around 06/04/2017) for ROB, if undelivered and B/P check later this week.   Roe Coombsachelle A Vicky Mccanless, CNM

## 2017-05-28 NOTE — Anesthesia Pain Management Evaluation Note (Signed)
  CRNA Pain Management Visit Note  Patient: Stacey Reid, 21 y.o., female  "Hello I am a member of the anesthesia team at [facility]. We have an anesthesia team available at all times to provide care throughout the hospital, including epidural management and anesthesia for C-section. I don't know your plan for the delivery whether it a natural birth, water birth, IV sedation, nitrous supplementation, doula or epidural, but we want to meet your pain goals."   1.Was your pain managed to your expectations on prior hospitalizations?   Yes   2.What is your expectation for pain management during this hospitalization?     Epidural  3.How can we help you reach that goal? unsure  Record the patient's initial score and the patient's pain goal.   Pain: 10  Pain Goal: 10 [facility] wants you to be able to say your pain was always managed very well.  Cephus ShellingBURGER,Haadiya Frogge 05/28/2017

## 2017-05-29 ENCOUNTER — Inpatient Hospital Stay (HOSPITAL_COMMUNITY): Payer: Medicaid Other | Admitting: Anesthesiology

## 2017-05-29 ENCOUNTER — Encounter (HOSPITAL_COMMUNITY): Payer: Self-pay

## 2017-05-29 DIAGNOSIS — O134 Gestational [pregnancy-induced] hypertension without significant proteinuria, complicating childbirth: Secondary | ICD-10-CM

## 2017-05-29 DIAGNOSIS — Z3A37 37 weeks gestation of pregnancy: Secondary | ICD-10-CM

## 2017-05-29 LAB — CBC
HCT: 28.7 % — ABNORMAL LOW (ref 36.0–46.0)
HEMATOCRIT: 29 % — AB (ref 36.0–46.0)
HEMOGLOBIN: 9.9 g/dL — AB (ref 12.0–15.0)
Hemoglobin: 9.6 g/dL — ABNORMAL LOW (ref 12.0–15.0)
MCH: 27.7 pg (ref 26.0–34.0)
MCH: 28.1 pg (ref 26.0–34.0)
MCHC: 33.4 g/dL (ref 30.0–36.0)
MCHC: 34.1 g/dL (ref 30.0–36.0)
MCV: 82.9 fL (ref 78.0–100.0)
MCV: 82.4 fL (ref 78.0–100.0)
PLATELETS: 300 10*3/uL (ref 150–400)
Platelets: 299 10*3/uL (ref 150–400)
RBC: 3.46 MIL/uL — AB (ref 3.87–5.11)
RBC: 3.52 MIL/uL — ABNORMAL LOW (ref 3.87–5.11)
RDW: 12.5 % (ref 11.5–15.5)
RDW: 12.8 % (ref 11.5–15.5)
WBC: 9.4 10*3/uL (ref 4.0–10.5)
WBC: 12.8 10*3/uL — AB (ref 4.0–10.5)

## 2017-05-29 LAB — COMPREHENSIVE METABOLIC PANEL
ALBUMIN: 2.8 g/dL — AB (ref 3.5–5.0)
ALK PHOS: 123 U/L (ref 38–126)
ALT: 11 U/L — ABNORMAL LOW (ref 14–54)
ANION GAP: 8 (ref 5–15)
AST: 22 U/L (ref 15–41)
CALCIUM: 8.6 mg/dL — AB (ref 8.9–10.3)
CO2: 21 mmol/L — AB (ref 22–32)
Chloride: 107 mmol/L (ref 101–111)
Creatinine, Ser: 0.56 mg/dL (ref 0.44–1.00)
GFR calc Af Amer: >60 mL/min (ref >60)
GFR calc non Af Amer: >60 mL/min (ref >60)
GLUCOSE: 87 mg/dL (ref 65–99)
POTASSIUM: 3.3 mmol/L — AB (ref 3.5–5.1)
SODIUM: 136 mmol/L (ref 135–145)
Total Bilirubin: 1 mg/dL (ref 0.3–1.2)
Total Protein: 6.4 g/dL — ABNORMAL LOW (ref 6.5–8.1)

## 2017-05-29 LAB — RPR: RPR Ser Ql: NONREACTIVE

## 2017-05-29 MED ORDER — FLEET ENEMA 7-19 GM/118ML RE ENEM: 1.0000 | ENEMA | Freq: Every day | RECTAL | Status: DC | PRN

## 2017-05-29 MED ORDER — FENTANYL 2.5 MCG/ML BUPIVACAINE 1/10 % EPIDURAL INFUSION (WH - ANES)
INTRAMUSCULAR | Status: AC
  Filled 2017-05-29: qty 100

## 2017-05-29 MED ORDER — EPHEDRINE 5 MG/ML INJ
10.0000 mg | INTRAVENOUS | Status: DC | PRN
  Filled 2017-05-29: qty 2

## 2017-05-29 MED ORDER — ACETAMINOPHEN 325 MG PO TABS: 650.0000 mg | ORAL_TABLET | ORAL | Status: DC | PRN

## 2017-05-29 MED ORDER — DIPHENHYDRAMINE HCL 25 MG PO CAPS: 25.0000 mg | ORAL_CAPSULE | Freq: Four times a day (QID) | ORAL | Status: DC | PRN

## 2017-05-29 MED ORDER — PHENYLEPHRINE 40 MCG/ML (10ML) SYRINGE FOR IV PUSH (FOR BLOOD PRESSURE SUPPORT)
PREFILLED_SYRINGE | INTRAVENOUS | Status: AC
  Administered 2017-05-29: 80 ug via INTRAVENOUS
  Filled 2017-05-29: qty 20

## 2017-05-29 MED ORDER — ZOLPIDEM TARTRATE 5 MG PO TABS: 5.0000 mg | ORAL_TABLET | Freq: Every evening | ORAL | Status: DC | PRN

## 2017-05-29 MED ORDER — COCONUT OIL OIL
1.0000 "application " | TOPICAL_OIL | Status: DC | PRN
  Filled 2017-05-29: qty 120

## 2017-05-29 MED ORDER — IBUPROFEN 100 MG/5ML PO SUSP
600.0000 mg | Freq: Four times a day (QID) | ORAL | Status: DC
  Filled 2017-05-29: qty 30

## 2017-05-29 MED ORDER — ONDANSETRON HCL 4 MG/2ML IJ SOLN
4.0000 mg | INTRAMUSCULAR | Status: DC | PRN
Start: 2017-05-29 — End: 2017-05-30

## 2017-05-29 MED ORDER — LACTATED RINGERS IV SOLN: 500.0000 mL | Freq: Once | INTRAVENOUS | Status: DC

## 2017-05-29 MED ORDER — OXYTOCIN 40 UNITS IN LACTATED RINGERS INFUSION - SIMPLE MED: 1.0000 m[IU]/min | INTRAVENOUS | Status: DC

## 2017-05-29 MED ORDER — SODIUM CHLORIDE 0.9 % IV SOLN: 250.0000 mL | INTRAVENOUS | Status: DC | PRN

## 2017-05-29 MED ORDER — IBUPROFEN 600 MG PO TABS: 600.0000 mg | ORAL_TABLET | Freq: Four times a day (QID) | ORAL | Status: DC

## 2017-05-29 MED ORDER — WITCH HAZEL-GLYCERIN EX PADS: 1.0000 "application " | MEDICATED_PAD | CUTANEOUS | Status: DC | PRN

## 2017-05-29 MED ORDER — DIPHENHYDRAMINE HCL 50 MG/ML IJ SOLN: 12.5000 mg | INTRAMUSCULAR | Status: DC | PRN

## 2017-05-29 MED ORDER — ONDANSETRON HCL 4 MG PO TABS: 4.0000 mg | ORAL_TABLET | ORAL | Status: DC | PRN

## 2017-05-29 MED ORDER — BENZOCAINE-MENTHOL 20-0.5 % EX AERO
1.0000 "application " | INHALATION_SPRAY | CUTANEOUS | Status: DC | PRN
  Administered 2017-05-29: 1 via TOPICAL
  Filled 2017-05-29: qty 56

## 2017-05-29 MED ORDER — BISACODYL 10 MG RE SUPP: 10.0000 mg | Freq: Every day | RECTAL | Status: DC | PRN

## 2017-05-29 MED ORDER — PHENYLEPHRINE 40 MCG/ML (10ML) SYRINGE FOR IV PUSH (FOR BLOOD PRESSURE SUPPORT)
80.0000 ug | PREFILLED_SYRINGE | INTRAVENOUS | Status: DC | PRN
  Administered 2017-05-29: 80 ug via INTRAVENOUS
  Filled 2017-05-29: qty 5

## 2017-05-29 MED ORDER — SODIUM CHLORIDE 0.9% FLUSH: 3.0000 mL | INTRAVENOUS | Status: DC | PRN

## 2017-05-29 MED ORDER — TETANUS-DIPHTH-ACELL PERTUSSIS 5-2.5-18.5 LF-MCG/0.5 IM SUSP: 0.5000 mL | Freq: Once | INTRAMUSCULAR | Status: DC

## 2017-05-29 MED ORDER — SENNOSIDES-DOCUSATE SODIUM 8.6-50 MG PO TABS
2.0000 | ORAL_TABLET | ORAL | Status: DC
  Filled 2017-05-29: qty 2

## 2017-05-29 MED ORDER — IBUPROFEN 100 MG/5ML PO SUSP
600.0000 mg | Freq: Four times a day (QID) | ORAL | Status: DC | PRN
  Administered 2017-05-29: 20 mg via ORAL
  Administered 2017-05-29 – 2017-05-30 (×2): 600 mg via ORAL
  Filled 2017-05-29 (×5): qty 30

## 2017-05-29 MED ORDER — MEASLES, MUMPS & RUBELLA VAC ~~LOC~~ INJ
0.5000 mL | INJECTION | Freq: Once | SUBCUTANEOUS | Status: DC
  Filled 2017-05-29: qty 0.5

## 2017-05-29 MED ORDER — SODIUM CHLORIDE 0.9% FLUSH: 3.0000 mL | Freq: Two times a day (BID) | INTRAVENOUS | Status: DC

## 2017-05-29 MED ORDER — FENTANYL 2.5 MCG/ML BUPIVACAINE 1/10 % EPIDURAL INFUSION (WH - ANES)
14.0000 mL/h | INTRAMUSCULAR | Status: DC | PRN
  Administered 2017-05-29: 14 mL/h via EPIDURAL

## 2017-05-29 MED ORDER — TERBUTALINE SULFATE 1 MG/ML IJ SOLN
0.2500 mg | Freq: Once | INTRAMUSCULAR | Status: DC | PRN
  Filled 2017-05-29: qty 1

## 2017-05-29 MED ORDER — DIBUCAINE 1 % RE OINT: 1.0000 "application " | TOPICAL_OINTMENT | RECTAL | Status: DC | PRN

## 2017-05-29 MED ORDER — PRENATAL MULTIVITAMIN CH
1.0000 | ORAL_TABLET | Freq: Every day | ORAL | Status: DC
  Filled 2017-05-29: qty 1

## 2017-05-29 MED ORDER — SIMETHICONE 80 MG PO CHEW: 80.0000 mg | CHEWABLE_TABLET | ORAL | Status: DC | PRN

## 2017-05-29 MED ORDER — LIDOCAINE HCL (PF) 1 % IJ SOLN
INTRAMUSCULAR | Status: DC | PRN
  Administered 2017-05-29 (×2): 4 mL via EPIDURAL

## 2017-05-29 NOTE — Anesthesia Procedure Notes (Signed)
Epidural Patient location during procedure: OB Start time: 05/29/2017 1:53 AM  Staffing Anesthesiologist: Karna ChristmasELLENDER, Asuna Peth P Performed: anesthesiologist   Preanesthetic Checklist Completed: patient identified, site marked, pre-op evaluation, timeout performed, IV checked, risks and benefits discussed and monitors and equipment checked  Epidural Patient position: sitting Prep: DuraPrep Patient monitoring: heart rate, cardiac monitor, continuous pulse ox and blood pressure Approach: midline Location: L3-L4 Injection technique: LOR air  Needle:  Needle type: Tuohy  Needle gauge: 17 G Needle length: 9 cm Needle insertion depth: 5 cm Catheter type: closed end flexible Catheter size: 19 Gauge Catheter at skin depth: 10 cm Test dose: negative and Other  Assessment Events: blood not aspirated, injection not painful, no injection resistance and negative IV test  Additional Notes Informed consent obtained prior to proceeding including risk of failure, 1% risk of PDPH, risk of minor discomfort and bruising.  Discussed rare but serious complications.  Discussed alternatives to epidural analgesia and patient desires to proceed.  Timeout performed pre-procedure verifying patient name, procedure, and platelet count.  Patient tolerated procedure well. Reason for block:procedure for pain

## 2017-05-29 NOTE — Anesthesia Preprocedure Evaluation (Signed)
Anesthesia Evaluation  Patient identified by MRN, date of birth, ID band Patient awake    Reviewed: Allergy & Precautions, NPO status , Patient's Chart, lab work & pertinent test results  Airway Mallampati: II  TM Distance: >3 FB Neck ROM: Full    Dental no notable dental hx.    Pulmonary asthma ,    Pulmonary exam normal breath sounds clear to auscultation       Cardiovascular hypertension, Normal cardiovascular exam Rhythm:Regular Rate:Normal     Neuro/Psych negative neurological ROS  negative psych ROS   GI/Hepatic negative GI ROS, Neg liver ROS,   Endo/Other  negative endocrine ROS  Renal/GU negative Renal ROS  negative genitourinary   Musculoskeletal negative musculoskeletal ROS (+)   Abdominal   Peds negative pediatric ROS (+)  Hematology negative hematology ROS (+)   Anesthesia Other Findings Plt: 300  Reproductive/Obstetrics (+) Pregnancy                             Anesthesia Physical Anesthesia Plan  ASA: II  Anesthesia Plan: Epidural   Post-op Pain Management:    Induction:   PONV Risk Score and Plan:   Airway Management Planned:   Additional Equipment:   Intra-op Plan:   Post-operative Plan:   Informed Consent: I have reviewed the patients History and Physical, chart, labs and discussed the procedure including the risks, benefits and alternatives for the proposed anesthesia with the patient or authorized representative who has indicated his/her understanding and acceptance.     Plan Discussed with:   Anesthesia Plan Comments:         Anesthesia Quick Evaluation

## 2017-05-29 NOTE — Progress Notes (Signed)
Patient ID: Stacey Reid, female   DOB: 01-08-1996, 21 y.o.   MRN: [id number] Stacey BrasilLavenia M Kehl is a 21 y.o. G3P1011 at 7975w0d [facility]  Subjective: Doing well. Denies ha, visual changes, ruq/epigastric pain, n/v.    Objective: BP 114/73   Pulse 68   Temp 98 F (36.7 C)   Resp 18   Ht 5\' 3"  (1.6 m)   Wt 81.2 kg (179 lb)   LMP 09/05/2016   SpO2 100%   BMI 31.71 kg/m  No intake/output data recorded.  FHT:  FHR: 120 bpm, variability: moderate,  accelerations:  Present,  decelerations:  Absent UC:   regular, every 1-3 minutes  SVE:   Dilation: 5 Effacement (%): 80 Station: 0 Exam by:: Cletis MediaK. Anderson, RN  Labs: Lab Results  Component Value Date   WBC 9.4 05/29/2017   HGB 9.6 (L) 05/29/2017   HCT 28.7 (L) 05/29/2017   MCV 82.9 05/29/2017   PLT 300 05/29/2017    Assessment / Plan: IOL d/t GHTN, foley bulb out @ midnight, was 3-4cm then and contracting well on her own, cx now 5/80/0, so will hold off on pitocin for right now  Labor: Progressing normally Fetal Wellbeing:  Category I Pain Control:  Labor support without medications Pre-eclampsia: bp's stable I/D:  n/a Anticipated MOD:  NSVD  Marge DuncansBooker, Richardson Dubree Randall CNM, WHNP-BC 05/29/2017, [phone]

## 2017-05-29 NOTE — Anesthesia Postprocedure Evaluation (Signed)
Anesthesia Post Note  Patient: Stacey BrasilLavenia M Veldman  Procedure(s) Performed: * No procedures listed *     Patient location during evaluation: Mother Baby Anesthesia Type: Epidural Level of consciousness: awake and alert Pain management: pain level controlled Vital Signs Assessment: post-procedure vital signs reviewed and stable Respiratory status: spontaneous breathing, nonlabored ventilation and respiratory function stable Cardiovascular status: stable Postop Assessment: no headache, no backache and epidural receding Anesthetic complications: no    Last Vitals:  Vitals:   05/29/17 0915 05/29/17 1024  BP: 138/81 123/82  Pulse: 79 75  Resp: 19 20  Temp: 37.2 C 36.7 C    Last Pain:  Vitals:   05/29/17 1024  TempSrc: Oral  PainSc: 0-No pain   Pain Goal:                 Denyce Harr

## 2017-05-29 NOTE — Progress Notes (Addendum)
Labor Progress Note  Stacey Reid is a 21 y.o. G3P1011 at 5258w0d admitted for induction of labor due to gestational hypertension.  S: Patient endorses lower back pain with contractions; she is able to breathe through them. Otherwise comfortable; no questions or concerns.   O:  BP (!) 127/91   Pulse 79   Temp 99.5 F (37.5 C) (Oral)   Resp 18   Ht 5\' 3"  (1.6 m)   Wt 81.2 kg (179 lb)   LMP 09/05/2016   BMI 31.71 kg/m   No intake/output data recorded.  FHT:  FHR: 120 bpm, variability: minimal ,  accelerations:  Present,  decelerations:  Absent UC:   every 2-3 min SVE:   Dilation: 1 Effacement (%): 60 Station: -3 Exam by:: Cletis MediaK. Anderson, RN   Labs: Lab Results  Component Value Date   WBC 6.2 05/28/2017   HGB 9.5 (L) 05/28/2017   HCT 27.6 (L) 05/28/2017   MCV 81.4 05/28/2017   PLT 300 05/28/2017    Assessment / Plan: 21 y.o. G3P1011 7158w0d  admitted for induction of labor due to gestational hypertension.  Labor: Foley bulb out; plan to start Pitocin  Fetal Wellbeing:  Category I Pain Control:  Epidural (planned) Anticipated MOD:  NSVD  Lavella HammockAlessandra Tomasi, MS3  I spoke with and examined patient and agree with resident/PA/SNM's note and plan of care.  Cheral MarkerKimberly R. Ernestine Rohman, CNM, WHNP-BC 05/29/2017 4:10 AM

## 2017-05-29 NOTE — Lactation Note (Addendum)
This note was copied from a baby's chart. Lactation Consultation Note  Patient Name: Stacey Reid Reason for consult: Initial assessment (baby presently having a bath and permom last fed at 1:26 28 ml of formula )  Mom standing watching the baby get a bath.  Baby is 7 hours old and has been to the breast in the am for 40 mins, 1 attempt .  Per mom plans to breast and formula.  LC reviewed supply and demand and the importance of giving the baby the opportunity to breast feed 1st before Supplementing. Reviewed the benefits of the colostrum.  Unable to show mom hand expressing at this point mom standing watching the bath.  Mother informed of post-discharge support and given phone number to the lactation department, including services for phone call assistance; out-patient appointments; and breastfeeding support group. List of other breastfeeding resources in the community given in the handout. Encouraged mother to call for problems or concerns related to breastfeeding.  Maternal Data Per mom breast fed her 1st baby for 4 months, and went back to work pumping, was given  Adequate time to pump at work, but had a difficult time letting down her milk.  Developed migrane headaches  Around that time and stopped breast feeding / pumping and switch  To formula. Mom denies issues with engorgement , or plugged ducts at that time.      Feeding Feeding Type:  (last fed at 1:26 pm and ate 28 ml ) Nipple Type: Slow - flow  LATCH Score/Interventions                Intervention(s): Breastfeeding basics reviewed (discussed supply and demad )     Lactation Tools Discussed/Used [facility]IC Program: [facility] [place] [facility]   Consult Status Consult Status: Follow-up (enc mom to page for feeding assessment / LC ) Date: 05/29/17 Follow-up type: In-patient    Stacey Reid, 2:39 PM

## 2017-05-29 NOTE — Progress Notes (Signed)
Patient ID: Stacey Reid, female   DOB: 1996/07/19, 21 y.o.   MRN: [id number] Stacey BrasilLavenia M Eveleth is a 21 y.o. G3P1011 at 2135w0d admitted [facility]  Subjective: Sharp constant pain upper mid back, hurts to move/breath  Objective: BP 133/84   Pulse (!) 102   Temp 98 F (36.7 C) (Oral)   Resp 18   Ht 5\' 3"  (1.6 m)   Wt 81.2 kg (179 lb)   LMP 09/05/2016   SpO2 98%   BMI 31.71 kg/m  No intake/output data recorded.  HR slighly tachycardic at 102, regular rhythm  LCTAB Pulse Ox 98% RA  FHT:  FHR: 125 bpm, variability: moderate,  accelerations:  Present,  decelerations:  Present earlies & mild variables UC:   q 2mins  SVE:   Dilation: 8 Effacement (%): 90 Station: +1 Exam by:: Genella RifeK. Other Atienza, CNM  + scalp stim Some increased vag bleeding on towel/pad below pt, bright red, some small stringy clots, nothing actively coming from vagina  Labs: Lab Results  Component Value Date   WBC 9.4 05/29/2017   HGB 9.6 (L) 05/29/2017   HCT 28.7 (L) 05/29/2017   MCV 82.9 05/29/2017   PLT 300 05/29/2017    Assessment / Plan: IOL d/t GHTN, progressing well on her own w/o pitocin. Increased vaginal bleeding. Pt stable w/ normal vs. FHR reassuring. Upper mid back pain- unsure if r/t epidural or referred pain- will try fentanyl and discuss w/ Dr. Adrian BlackwaterStinson  Will also recheck cbc & cmp  Labor: Progressing normally Fetal Wellbeing:  Category II Pain Control:  Epidural and IV pain meds Pre-eclampsia: bp's stable I/D:  n/a Anticipated MOD:  NSVD  Marge DuncansBooker, Chanze Teagle Randall CNM, WHNP-BC 05/29/2017, 6:17 AM

## 2017-05-30 MED ORDER — SENNOSIDES-DOCUSATE SODIUM 8.6-50 MG PO TABS: 2.0000 | ORAL_TABLET | Freq: Every day | ORAL | 2 refills | Status: DC

## 2017-05-30 MED ORDER — FERROUS SULFATE 325 (65 FE) MG PO TABS: 325.0000 mg | ORAL_TABLET | Freq: Three times a day (TID) | ORAL | 2 refills | Status: DC

## 2017-05-30 MED ORDER — IBUPROFEN 100 MG/5ML PO SUSP: 600.0000 mg | Freq: Four times a day (QID) | ORAL | 4 refills | Status: DC | PRN

## 2017-05-30 MED ORDER — FERROUS SULFATE 325 (65 FE) MG PO TABS
325.0000 mg | ORAL_TABLET | Freq: Three times a day (TID) | ORAL | Status: DC
  Filled 2017-05-30: qty 1

## 2017-05-30 NOTE — Progress Notes (Signed)
Post Partum Day 1  Subjective:  Stacey Reid is a 21 y.o. [id number] 8151w0d s/p NSVD.  No acute events overnight.  Pt denies problems with ambulating, voiding or po intake.  She denies nausea or vomiting.  Pain is well controlled.  She has had flatus. She has not had bowel movement.  Lochia small.  Plan for birth control is bilateral tubal ligation.  Method of Feeding: breast and bottle.   Objective: BP (!) 115/49   Pulse 64   Temp 98.2 F (36.8 C)   Resp 18   Ht 5\' 3"  (1.6 m)   Wt 81.2 kg (179 lb)   LMP 09/05/2016   SpO2 100%   Breastfeeding? Unknown   BMI 31.71 kg/m   Physical Exam:  General: alert, cooperative and no distress Lochia:normal flow Chest: CTAB Heart: RRR no m/r/g Abdomen: +BS, soft, nontender, fundus firm at umbilicus DVT Evaluation: No evidence of DVT seen on physical exam. Extremities: no edema   Recent Labs  05/29/17 0108 05/29/17 0635  HGB 9.6* 9.9*  HCT 28.7* 29.0*    Assessment/Plan:  ASSESSMENT: Stacey Reid is a 21 y.o. [id number] 2651w0d ppd #1 s/p NSVD doing well.   Plan for discharge tomorrow.    LOS: 2 days   Thurnell Stacey Reid 05/30/2017, 7:29 AM

## 2017-05-30 NOTE — Progress Notes (Signed)
Post Partum Day #1 Subjective: no complaints, up ad lib, voiding and tolerating PO  Objective: Blood pressure (!) 115/49, pulse 64, temperature 98.2 F (36.8 C), resp. rate 18, height 5\' 3"  (1.6 m), weight 179 lb (81.2 kg), last menstrual period 09/05/2016, SpO2 100 %, unknown if currently breastfeeding.  Physical Exam:  General: alert, cooperative and no distress Lochia: appropriate Uterine Fundus: firm Incision: 1st deg.; healing DVT Evaluation: No evidence of DVT seen on physical exam. No cords or calf tenderness. No significant calf/ankle edema.   Recent Labs  05/29/17 0108 05/29/17 0635  HGB 9.6* 9.9*  HCT 28.7* 29.0*    Assessment/Plan: Discharge home, Breastfeeding and Contraception interval BTL   LOS: 2 days   Stacey Reid, CNM 05/30/2017, 8:07 AM

## 2017-05-30 NOTE — Discharge Summary (Signed)
OB Discharge Summary     Patient Name: Stacey BrasilLavenia M Hufnagle DOB: 23-Feb-1996 MRN: [id number]  Date of admission: 05/28/2017 Delivering MD: Shawna ClampBOOKER, KIMBERLY R   Date of discharge: 05/30/2017  Admitting diagnosis: 37WKS, BP Intrauterine pregnancy: 4689w0d     Secondary diagnosis:  Active Problems:   Encounter for induction of labor  Additional problems: GHTN, hx of Preeclampsia     Discharge diagnosis: Term Pregnancy Delivered and Preeclampsia (mild)                                                                                                Post partum procedures:none  Augmentation: Foley Balloon  Complications: None  Hospital course:  Induction of Labor With Vaginal Delivery   21 y.o. yo [id number] at 3989w0d was admitted to the hospital 05/28/2017 for induction of labor.  Indication for induction: Gestational hypertension.  Patient had an uncomplicated labor course as follows: Membrane Rupture Time/Date: 2:04 AM ,05/29/2017   Intrapartum Procedures: Episiotomy: None [1]                                         Lacerations:  1st degree [2]  Patient had delivery of a Viable infant.  Information for the patient's newborn:  Verdia KubaLetterlough, Girl Legacy [[id number]][[id number]]  Delivery Method: Vag-Spont   05/29/2017  Details of delivery can be found in separate delivery note.  Patient had a routine postpartum course. Patient is discharged home 05/30/17.  Physical exam  Vitals:   05/29/17 1430 05/29/17 1810 05/29/17 2230 05/30/17 0548  BP: 123/72 118/65 110/63 (!) 115/49  Pulse: 77 75 80 64  Resp: 18 18 18 18   Temp: 98.3 F (36.8 C) 99.1 F (37.3 C) 98.1 F (36.7 C) 98.2 F (36.8 C)  TempSrc: Oral Oral Oral   SpO2:      Weight:      Height:       General: alert, cooperative and no distress Lochia: appropriate Uterine Fundus: firm Incision: N/A DVT Evaluation: No evidence of DVT seen on physical exam. No cords or calf tenderness. No significant calf/ankle edema. Labs: Lab Results   Component Value Date   WBC 12.8 (H) 05/29/2017   HGB 9.9 (L) 05/29/2017   HCT 29.0 (L) 05/29/2017   MCV 82.4 05/29/2017   PLT 299 05/29/2017   CMP Latest Ref Rng & Units 05/29/2017  Glucose 65 - 99 mg/dL 87  BUN 6 - 20 mg/dL <1(H<5(L)  Creatinine 0.860.44 - 1.00 mg/dL 5.780.56  Sodium 469135 - 629145 mmol/L 136  Potassium 3.5 - 5.1 mmol/L 3.3(L)  Chloride 101 - 111 mmol/L 107  CO2 22 - 32 mmol/L 21(L)  Calcium 8.9 - 10.3 mg/dL 5.2(W8.6(L)  Total Protein 6.5 - 8.1 g/dL 6.4(L)  Total Bilirubin 0.3 - 1.2 mg/dL 1.0  Alkaline Phos 38 - 126 U/L 123  AST 15 - 41 U/L 22  ALT 14 - 54 U/L 11(L)    Discharge instruction: per After Visit Summary and "Baby and Me Booklet".  After visit meds:  Allergies as of 05/30/2017   No Known Allergies     Medication List    STOP taking these medications   diphenhydrAMINE 25 MG tablet Commonly known as:  BENADRYL     TAKE these medications   acetaminophen 160 MG/5ML suspension Commonly known as:  TYLENOL Take 320 mg by mouth every 6 (six) hours as needed for mild pain, moderate pain, fever or headache.   albuterol 108 (90 Base) MCG/ACT inhaler Commonly known as:  PROVENTIL HFA;VENTOLIN HFA Inhale 2 puffs into the lungs every 6 (six) hours as needed for wheezing or shortness of breath.   Butalbital-APAP-Caffeine 50-325-40 MG capsule Take 1-2 capsules by mouth every 6 (six) hours as needed for headache.   ferrous sulfate 325 (65 FE) MG tablet Take 1 tablet (325 mg total) by mouth 3 (three) times daily with meals.   ibuprofen 100 MG/5ML suspension Commonly known as:  ADVIL,MOTRIN Take 30 mLs (600 mg total) by mouth every 6 (six) hours as needed for mild pain (for pain).   NATACHEW 28-1 MG Chew Chew 1 tablet by mouth daily.   senna-docusate 8.6-50 MG tablet Commonly known as:  Senokot-S Take 2 tablets by mouth at bedtime.       Diet: routine diet  Activity: Advance as tolerated. Pelvic rest for 6 weeks.   Outpatient follow up:1 week blood pressure  check, 4 week postpartum exam Follow up Appt:Future Appointments Date Time Provider Department Center  06/25/2017 1:00 PM Hermina Staggers, MD [facility] None   Follow up Visit:No Follow-up on file.  Postpartum contraception: Tubal Ligation  Newborn Data: Live born female  Birth Weight: 7 lb 4.2 oz (3295 g) APGAR: 9, 9  Baby Feeding: Breast Disposition:home with mother   05/30/2017 Roe Coombs, CNM

## 2017-05-30 NOTE — Progress Notes (Signed)
Postpartum and newborn education given.  Reminder of newborn and maternal MD visits.  Pt verbalizes understanding with no other questions.  Pt [facility].

## 2017-05-30 NOTE — Lactation Note (Addendum)
This note was copied from a baby's chart. Lactation Consultation Note: Mother describes pinching pain on her nipples when infant is latched on. Observed mother nipples and not cracking observed. Mother advised to hand express colostrum and apply to nipples. Mother has been using a #16 nipple, she reports that she sees milk in the shield. Observed mother placing nipple shield on . Mother was refit with a #20 nipple shield. Mother has just fed infant 14 ml of formula with a bottle. Mother advised to page [facility] when she latches infant on again. She reports that she has an electric pump at home. Advised mother to pump every 2-3 hours for 15-20 mins. Mother advised to do good breast massage and use Ice to decrease swelling as needed. Mother advised to be cautious about S/S of Mastitis. Mother is aware of available LC services as needed.   Patient Name: Stacey Reid: 05/30/2017 Reason for consult: Follow-up assessment   Maternal Data    Feeding Feeding Type: Formula Length of feed: 5 min  LATCH Score/Interventions                      Lactation Tools Discussed/Used     Consult Status      Michel BickersKendrick, Jamiere Gulas McCoy 05/30/2017, 10:24 AM

## 2017-06-04 ENCOUNTER — Encounter: Payer: Self-pay | Admitting: Certified Nurse Midwife

## 2017-06-05 ENCOUNTER — Ambulatory Visit: Payer: Medicaid Other | Admitting: *Deleted

## 2017-06-05 VITALS — BP 141/96 | HR 87

## 2017-06-05 DIAGNOSIS — Z0130 Encounter for examination of blood pressure without abnormal findings: Secondary | ICD-10-CM

## 2017-06-05 MED ORDER — HYDROCHLOROTHIAZIDE 25 MG PO TABS: 25.0000 mg | ORAL_TABLET | Freq: Every day | ORAL | 0 refills | Status: DC

## 2017-06-05 NOTE — Progress Notes (Signed)
Pt is in office for bp check. Pt states she is having some HA's and feeling engorged breast.  Pt has elevated BP in office today.   Reviewed with Dr Debroah LoopArnold. He orders HCTZ 25mg  1 PO daily, D-20. Pt to follow up in office in 1 week for BP check.  Pt states that she has hard time with pills.  Pt advised to check Rx before she pays, if to large of pill she may contact office to follow up on possible change to Rx. Pt advised she may also discuss with pt if tablet can be halved and/or crushed and take with same effectiveness.  Pt advised to contact office if any problems getting or taking Rx.  Pt advised if any symptoms do not improve or they become worse, vision changes/swelling/continual HA, to seek urgent care and/or contact office.   Pt states understanding. Pt to make appt for 1 week follow up at check out today.

## 2017-06-12 ENCOUNTER — Ambulatory Visit: Payer: Medicaid Other

## 2017-06-12 VITALS — BP 139/99 | HR 117

## 2017-06-12 DIAGNOSIS — I10 Essential (primary) hypertension: Secondary | ICD-10-CM

## 2017-06-12 MED ORDER — AMLODIPINE BESYLATE 5 MG PO TABS: 5.0000 mg | ORAL_TABLET | Freq: Every day | ORAL | 1 refills | Status: DC

## 2017-06-12 MED ORDER — CARVEDILOL 12.5 MG PO TABS: 12.5000 mg | ORAL_TABLET | Freq: Two times a day (BID) | ORAL | 1 refills | Status: DC

## 2017-06-12 NOTE — Progress Notes (Signed)
Patient is in the office for BP check. Provider ordered additional rx to be sent based on BP readings and pt to return in 2 weeks.

## 2017-06-25 ENCOUNTER — Ambulatory Visit (INDEPENDENT_AMBULATORY_CARE_PROVIDER_SITE_OTHER): Payer: Medicaid Other | Admitting: Obstetrics and Gynecology

## 2017-06-25 ENCOUNTER — Encounter: Payer: Self-pay | Admitting: Obstetrics and Gynecology

## 2017-06-25 ENCOUNTER — Encounter (HOSPITAL_COMMUNITY): Payer: Self-pay

## 2017-06-25 DIAGNOSIS — O139 Gestational [pregnancy-induced] hypertension without significant proteinuria, unspecified trimester: Secondary | ICD-10-CM

## 2017-06-25 DIAGNOSIS — Z3009 Encounter for other general counseling and advice on contraception: Secondary | ICD-10-CM | POA: Insufficient documentation

## 2017-06-25 NOTE — Patient Instructions (Signed)
Health Maintenance, Female Adopting a healthy lifestyle and getting preventive care can go a long way to promote health and wellness. Talk with your health care provider about what schedule of regular examinations is right for you. This is a good chance for you to check in with your provider about disease prevention and staying healthy. In between checkups, there are plenty of things you can do on your own. Experts have done a lot of research about which lifestyle changes and preventive measures are most likely to keep you healthy. Ask your health care provider for more information. Weight and diet Eat a healthy diet  Be sure to include plenty of vegetables, fruits, low-fat dairy products, and lean protein.  Do not eat a lot of foods high in solid fats, added sugars, or salt.  Get regular exercise. This is one of the most important things you can do for your health. ? Most adults should exercise for at least 150 minutes each week. The exercise should increase your heart rate and make you sweat (moderate-intensity exercise). ? Most adults should also do strengthening exercises at least twice a week. This is in addition to the moderate-intensity exercise.  Maintain a healthy weight  Body mass index (BMI) is a measurement that can be used to identify possible weight problems. It estimates body fat based on height and weight. Your health care provider can help determine your BMI and help you achieve or maintain a healthy weight.  For females 20 years of age and older: ? A BMI below 18.5 is considered underweight. ? A BMI of 18.5 to 24.9 is normal. ? A BMI of 25 to 29.9 is considered overweight. ? A BMI of 30 and above is considered obese.  Watch levels of cholesterol and blood lipids  You should start having your blood tested for lipids and cholesterol at 23 years of age, then have this test every 5 years.  You may need to have your cholesterol levels checked more often if: ? Your lipid or  cholesterol levels are high. ? You are older than 23 years of age. ? You are at high risk for heart disease.  Cancer screening Lung Cancer  Lung cancer screening is recommended for adults 55-80 years old who are at high risk for lung cancer because of a history of smoking.  A yearly low-dose CT scan of the lungs is recommended for people who: ? Currently smoke. ? Have quit within the past 15 years. ? Have at least a 30-pack-year history of smoking. A pack year is smoking an average of one pack of cigarettes a day for 1 year.  Yearly screening should continue until it has been 15 years since you quit.  Yearly screening should stop if you develop a health problem that would prevent you from having lung cancer treatment.  Breast Cancer  Practice breast self-awareness. This means understanding how your breasts normally appear and feel.  It also means doing regular breast self-exams. Let your health care provider know about any changes, no matter how small.  If you are in your 20s or 30s, you should have a clinical breast exam (CBE) by a health care provider every 1-3 years as part of a regular health exam.  If you are 40 or older, have a CBE every year. Also consider having a breast X-ray (mammogram) every year.  If you have a family history of breast cancer, talk to your health care provider about genetic screening.  If you are at high risk   for breast cancer, talk to your health care provider about having an MRI and a mammogram every year.  Breast cancer gene (BRCA) assessment is recommended for women who have family members with BRCA-related cancers. BRCA-related cancers include: ? Breast. ? Ovarian. ? Tubal. ? Peritoneal cancers.  Results of the assessment will determine the need for genetic counseling and BRCA1 and BRCA2 testing.  Cervical Cancer Your health care provider may recommend that you be screened regularly for cancer of the pelvic organs (ovaries, uterus, and  vagina). This screening involves a pelvic examination, including checking for microscopic changes to the surface of your cervix (Pap test). You may be encouraged to have this screening done every 3 years, beginning at age 22.  For women ages 56-65, health care providers may recommend pelvic exams and Pap testing every 3 years, or they may recommend the Pap and pelvic exam, combined with testing for human papilloma virus (HPV), every 5 years. Some types of HPV increase your risk of cervical cancer. Testing for HPV may also be done on women of any age with unclear Pap test results.  Other health care providers may not recommend any screening for nonpregnant women who are considered low risk for pelvic cancer and who do not have symptoms. Ask your health care provider if a screening pelvic exam is right for you.  If you have had past treatment for cervical cancer or a condition that could lead to cancer, you need Pap tests and screening for cancer for at least 20 years after your treatment. If Pap tests have been discontinued, your risk factors (such as having a new sexual partner) need to be reassessed to determine if screening should resume. Some women have medical problems that increase the chance of getting cervical cancer. In these cases, your health care provider may recommend more frequent screening and Pap tests.  Colorectal Cancer  This type of cancer can be detected and often prevented.  Routine colorectal cancer screening usually begins at 23 years of age and continues through 23 years of age.  Your health care provider may recommend screening at an earlier age if you have risk factors for colon cancer.  Your health care provider may also recommend using home test kits to check for hidden blood in the stool.  A small camera at the end of a tube can be used to examine your colon directly (sigmoidoscopy or colonoscopy). This is done to check for the earliest forms of colorectal  cancer.  Routine screening usually begins at age 33.  Direct examination of the colon should be repeated every 5-10 years through 23 years of age. However, you may need to be screened more often if early forms of precancerous polyps or small growths are found.  Skin Cancer  Check your skin from head to toe regularly.  Tell your health care provider about any new moles or changes in moles, especially if there is a change in a mole's shape or color.  Also tell your health care provider if you have a mole that is larger than the size of a pencil eraser.  Always use sunscreen. Apply sunscreen liberally and repeatedly throughout the day.  Protect yourself by wearing long sleeves, pants, a wide-brimmed hat, and sunglasses whenever you are outside.  Heart disease, diabetes, and high blood pressure  High blood pressure causes heart disease and increases the risk of stroke. High blood pressure is more likely to develop in: ? People who have blood pressure in the high end of  the normal range (130-139/85-89 mm Hg). ? People who are overweight or obese. ? People who are African American.  If you are 21-29 years of age, have your blood pressure checked every 3-5 years. If you are 3 years of age or older, have your blood pressure checked every year. You should have your blood pressure measured twice-once when you are at a hospital or clinic, and once when you are not at a hospital or clinic. Record the average of the two measurements. To check your blood pressure when you are not at a hospital or clinic, you can use: ? An automated blood pressure machine at a pharmacy. ? A home blood pressure monitor.  If you are between 17 years and 37 years old, ask your health care provider if you should take aspirin to prevent strokes.  Have regular diabetes screenings. This involves taking a blood sample to check your fasting blood sugar level. ? If you are at a normal weight and have a low risk for diabetes,  have this test once every three years after 23 years of age. ? If you are overweight and have a high risk for diabetes, consider being tested at a younger age or more often. Preventing infection Hepatitis B  If you have a higher risk for hepatitis B, you should be screened for this virus. You are considered at high risk for hepatitis B if: ? You were born in a country where hepatitis B is common. Ask your health care provider which countries are considered high risk. ? Your parents were born in a high-risk country, and you have not been immunized against hepatitis B (hepatitis B vaccine). ? You have HIV or AIDS. ? You use needles to inject street drugs. ? You live with someone who has hepatitis B. ? You have had sex with someone who has hepatitis B. ? You get hemodialysis treatment. ? You take certain medicines for conditions, including cancer, organ transplantation, and autoimmune conditions.  Hepatitis C  Blood testing is recommended for: ? Everyone born from [birth year] through 1967. ? Anyone with known risk factors for hepatitis C.  Sexually transmitted infections (STIs)  You should be screened for sexually transmitted infections (STIs) including gonorrhea and chlamydia if: ? You are sexually active and are younger than 23 years of age. ? You are older than 23 years of age and your health care provider tells you that you are at risk for this type of infection. ? Your sexual activity has changed since you were last screened and you are at an increased risk for chlamydia or gonorrhea. Ask your health care provider if you are at risk.  If you do not have HIV, but are at risk, it may be recommended that you take a prescription medicine daily to prevent HIV infection. This is called pre-exposure prophylaxis (PrEP). You are considered at risk if: ? You are sexually active and do not regularly use condoms or know the HIV status of your partner(s). ? You take drugs by injection. ? You are  sexually active with a partner who has HIV.  Talk with your health care provider about whether you are at high risk of being infected with HIV. If you choose to begin PrEP, you should first be tested for HIV. You should then be tested every 3 months for as long as you are taking PrEP. Pregnancy  If you are premenopausal and you may become pregnant, ask your health care provider about preconception counseling.  If you may become  pregnant, take 400 to 800 micrograms (mcg) of folic acid every day.  If you want to prevent pregnancy, talk to your health care provider about birth control (contraception). Osteoporosis and menopause  Osteoporosis is a disease in which the bones lose minerals and strength with aging. This can result in serious bone fractures. Your risk for osteoporosis can be identified using a bone density scan.  If you are 58 years of age or older, or if you are at risk for osteoporosis and fractures, ask your health care provider if you should be screened.  Ask your health care provider whether you should take a calcium or vitamin D supplement to lower your risk for osteoporosis.  Menopause may have certain physical symptoms and risks.  Hormone replacement therapy may reduce some of these symptoms and risks. Talk to your health care provider about whether hormone replacement therapy is right for you. Follow these instructions at home:  Schedule regular health, dental, and eye exams.  Stay current with your immunizations.  Do not use any tobacco products including cigarettes, chewing tobacco, or electronic cigarettes.  If you are pregnant, do not drink alcohol.  If you are breastfeeding, limit how much and how often you drink alcohol.  Limit alcohol intake to no more than 1 drink per day for nonpregnant women. One drink equals 12 ounces of beer, 5 ounces of wine, or 1 ounces of hard liquor.  Do not use street drugs.  Do not share needles.  Ask your health care  provider for help if you need support or information about quitting drugs.  Tell your health care provider if you often feel depressed.  Tell your health care provider if you have ever been abused or do not feel safe at home. This information is not intended to replace advice given to you by your health care provider. Make sure you discuss any questions you have with your health care provider. Document Released: 06/25/2011 Document Revised: 05/17/2016 Document Reviewed: 09/13/2015 Elsevier Interactive Patient Education  2018 [employer]. Laparoscopic Tubal Ligation Laparoscopic tubal ligation is a procedure to close the fallopian tubes. This is done so that you cannot get pregnant. When the fallopian tubes are closed, the eggs that your ovaries release cannot enter the uterus, and sperm cannot reach the released eggs. A laparoscopic tubal ligation is sometimes called "getting your tubes tied." You should not have this procedure if you want to get pregnant someday or if you are unsure about having more children. Tell a health care provider about:  Any allergies you have.  All medicines you are taking, including vitamins, herbs, eye drops, creams, and over-the-counter medicines.  Any problems you or family members have had with anesthetic medicines.  Any blood disorders you have.  Any surgeries you have had.  Any medical conditions you have.  Whether you are pregnant or may be pregnant.  Any past pregnancies. What are the risks? Generally, this is a safe procedure. However, problems may occur, including:  Infection.  Bleeding.  Injury to surrounding organs.  Side effects from anesthetics.  Failure of the procedure.  This procedure can increase your risk of a kind of pregnancy in which a fertilized egg attaches to the outside of the uterus (ectopic pregnancy). What happens before the procedure?  Ask your health care provider about: ? Changing or stopping your regular  medicines. This is especially important if you are taking diabetes medicines or blood thinners. ? Taking medicines such as aspirin and ibuprofen. These medicines can thin  your blood. Do not take these medicines before your procedure if your health care provider instructs you not to.  Follow instructions from your health care provider about eating and drinking restrictions.  Plan to have someone take you home after the procedure.  If you go home right after the procedure, plan to have someone with you for 24 hours. What happens during the procedure?  You will be given one or more of the following: ? A medicine to help you relax (sedative). ? A medicine to numb the area (local anesthetic). ? A medicine to make you fall asleep (general anesthetic). ? A medicine that is injected into an area of your body to numb everything below the injection site (regional anesthetic).  An IV tube will be inserted into one of your veins. It will be used to give you medicines and fluids during the procedure.  Your bladder may be emptied with a small tube (catheter).  If you have been given a general anesthetic, a tube will be put down your throat to help you breathe.  Two small cuts (incisions) will be made in your lower abdomen and near your belly button.  Your abdomen will be inflated with a gas. This will let the surgeon see better and will give the surgeon room to work.  A thin, lighted tube (laparoscope) with a camera attached will be inserted into your abdomen through one of the incisions. Small instruments will be inserted through the other incision.  The fallopian tubes will be tied off, burned (cauterized), or blocked with a clip, ring, or clamp. A small portion in the center of each fallopian tube may be removed.  The gas will be released from the abdomen.  The incisions will be closed with stitches (sutures).  A bandage (dressing) will be placed over the incisions. The procedure may vary  among health care providers and hospitals. What happens after the procedure?  Your blood pressure, heart rate, breathing rate, and blood oxygen level will be monitored often until the medicines you were given have worn off.  You will be given medicine to help with pain, nausea, and vomiting as needed. This information is not intended to replace advice given to you by your health care provider. Make sure you discuss any questions you have with your health care provider. Document Released: 03/18/2001 Document Revised: 05/17/2016 Document Reviewed: 11/20/2015 Elsevier Interactive Patient Education  [employer].

## 2017-06-25 NOTE — Progress Notes (Signed)
Post Partum Exam  Stacey Reid is a 21 y.o. [phone] [id number] female who presents for a postpartum visit. She is 4 weeks postpartum following a NSVD. I have fully reviewed the prenatal and intrapartum course. The delivery was at 39w 1d gestational weeks.  Anesthesia: epidural. Postpartum course has been unremarkable. Baby's course has been unremarkable. Baby is feeding by bottle - Similac Advance. Bleeding no bleeding. Bowel function is normal. Bladder function is normal. Patient is not sexually active. Contraception method is none. Postpartum depression screening:neg  The following portions of the patient's history were reviewed and updated as appropriate: allergies, current medications, past family history, past medical history, past social history and past surgical history.  Review of Systems Pertinent items are noted in HPI.    Objective:  unknown if currently breastfeeding.  General:  alert   Breasts:  not evaluated  Lungs: clear to auscultation bilaterally  Heart:  regular rate and rhythm, S1, S2 normal, no murmur, click, rub or gallop  Abdomen: soft, non-tender; bowel sounds normal; no masses,  no organomegaly   Vulva:  not evaluated  Vagina: not evaluated  Cervix:  not evaluated  Corpus: not examined  Adnexa:  not evaluated  Rectal Exam: Not performed.        Assessment:    Nl postpartum exam. Pap smear negative 11/05/16.  Unwanted fertility  GHTN Plan:   1. Contraception: Pt will be scheduled for lap BTL. Papers signed 05/30/17. R/B/post op care and failure rate reviewed with pt.  2. BP stable. Pt only taking HCTZ, will completed current Rx and then monitor BP  3. Follow up with Post op  or as needed.

## 2017-06-28 NOTE — Patient Instructions (Addendum)
Your procedure is scheduled on:  Tomorrow, July 02, 2017  [facility]  11:15 AM  Pick up the phone at the desk and dial [phone].  Call this number if you have problems the morning of surgery: [phone].  Remember: Do NOT eat food:  After Midnight Tonight  Do NOT drink clear liquids after:  6:45 AM Tomorrow  Take these medicines the morning of surgery with a SIP OF WATER:  None  Stop ALL herbal medications at this time  Do NOT smoke the day of surgery.  Do NOT wear jewelry (body piercing), metal hair clips/bobby pins, make-up, artifical eyelashes or nail polish. Do NOT wear lotions, powders, or perfumes.  You may wear deodorant. Do NOT shave for 48 hours prior to surgery. Do NOT bring valuables to the hospital. Contacts, dentures, or bridgework may not be worn into surgery.  Have a responsible adult drive you home and stay with you for 24 hours after your procedure  Bring a copy of your healthcare power of attorney and living will documents.

## 2017-07-01 ENCOUNTER — Encounter (HOSPITAL_COMMUNITY)
Admission: RE | Admit: 2017-07-01 | Discharge: 2017-07-01 | Disposition: A | Discharge status: Home / Self Care | Payer: Medicaid Other | Source: Ambulatory Visit | Attending: Obstetrics and Gynecology | Admitting: Obstetrics and Gynecology

## 2017-07-01 ENCOUNTER — Encounter (HOSPITAL_COMMUNITY): Payer: Self-pay

## 2017-07-01 DIAGNOSIS — Z01818 Encounter for other preprocedural examination: Secondary | ICD-10-CM | POA: Insufficient documentation

## 2017-07-01 HISTORY — DX: Headache: R51

## 2017-07-01 HISTORY — DX: Cardiac murmur, unspecified: R01.1

## 2017-07-01 HISTORY — DX: Headache, unspecified: R51.9

## 2017-07-01 HISTORY — DX: Syncope and collapse: R55

## 2017-07-01 LAB — CBC
HEMATOCRIT: 34.5 % — AB (ref 36.0–46.0)
HEMOGLOBIN: 11.2 g/dL — AB (ref 12.0–15.0)
MCH: 26.7 pg (ref 26.0–34.0)
MCHC: 32.5 g/dL (ref 30.0–36.0)
MCV: 82.1 fL (ref 78.0–100.0)
Platelets: 374 10*3/uL (ref 150–400)
RBC: 4.2 MIL/uL (ref 3.87–5.11)
RDW: 13.8 % (ref 11.5–15.5)
WBC: 3.4 10*3/uL — ABNORMAL LOW (ref 4.0–10.5)

## 2017-07-01 LAB — BASIC METABOLIC PANEL
Anion gap: 5 (ref 5–15)
BUN: 11 mg/dL (ref 6–20)
CHLORIDE: 107 mmol/L (ref 101–111)
CO2: 27 mmol/L (ref 22–32)
Calcium: 8.9 mg/dL (ref 8.9–10.3)
Creatinine, Ser: 0.84 mg/dL (ref 0.44–1.00)
Glucose, Bld: 93 mg/dL (ref 65–99)
Potassium: 4 mmol/L (ref 3.5–5.1)
SODIUM: 139 mmol/L (ref 135–145)

## 2017-07-01 NOTE — H&P (Signed)
Stacey BrasilLavenia M Hach is an 21 y.o. female who presents today for Laparocopic BTL due to unwanted fertility.   Menstrual History: Menarche age: [age] No LMP recorded.    Past Medical History:  Diagnosis Date  . Anemia affecting pregnancy 03/21/2017  . Asthma    years ago, exercise induced  . Dyspnea    with pregnancy  . Headache    with pregnancy  . Heart murmur    history of, resolved 3 months later  . Hyperemesis 2016  . Hypertension   . Pregnancy induced hypertension   . Syncope    in high school, found out had a heart murmur    Past Surgical History:  Procedure Laterality Date  . WISDOM TOOTH EXTRACTION      Family History  Problem Relation Age of Onset  . Asthma Mother   . Hypertension Mother   . Miscarriages / [place] Mother     Social History:  reports that she has never smoked. She has never used smokeless tobacco. She reports that she drinks alcohol. She reports that she does not use drugs.  Allergies: No Known Allergies  No prescriptions prior to admission.    Review of Systems  Constitutional: Negative.   Respiratory: Negative.   Cardiovascular: Negative.   Gastrointestinal: Negative.   Genitourinary: Negative.     not currently breastfeeding. Physical Exam  Constitutional: She appears well-developed and well-nourished.  Cardiovascular: Normal rate and regular rhythm.   Respiratory: Effort normal and breath sounds normal.  GI: Soft. Bowel sounds are normal.  Genitourinary:  Genitourinary Comments: Deferred to OR    Results for orders placed or performed during the hospital encounter of 07/01/17 (from the past 24 hour(s))  Basic metabolic panel     Status: None   Collection Time: 07/01/17  9:00 AM  Result Value Ref Range   Sodium 139 135 - 145 mmol/L   Potassium 4.0 3.5 - 5.1 mmol/L   Chloride 107 101 - 111 mmol/L   CO2 27 22 - 32 mmol/L   Glucose, Bld 93 65 - 99 mg/dL   BUN 11 6 - 20 mg/dL   Creatinine, Ser 6.570.84 0.44 - 1.00 mg/dL   Calcium 8.9 8.9 - 84.610.3 mg/dL   GFR calc non Af Amer >60 >60 mL/min   GFR calc Af Amer >60 >60 mL/min   Anion gap 5 5 - 15  CBC     Status: Abnormal   Collection Time: 07/01/17  9:00 AM  Result Value Ref Range   WBC 3.4 (L) 4.0 - 10.5 K/uL   RBC 4.20 3.87 - 5.11 MIL/uL   Hemoglobin 11.2 (L) 12.0 - 15.0 g/dL   HCT 96.234.5 (L) 95.236.0 - 84.146.0 %   MCV 82.1 78.0 - 100.0 fL   MCH 26.7 26.0 - 34.0 pg   MCHC 32.5 30.0 - 36.0 g/dL   RDW 32.413.8 40.111.5 - 02.715.5 %   Platelets 374 150 - 400 K/uL    No results found.  Assessment/Plan: Unwanted fertility  Laparoscopic BTL. R/B/Post op care and failure rate have been reviewed with pt. She has verbalized understanding and desires to proceed.   Hermina StaggersMichael L Wynette [place] 07/01/2017, 10:19 AM

## 2017-07-02 ENCOUNTER — Encounter (HOSPITAL_COMMUNITY): Payer: Self-pay | Admitting: Anesthesiology

## 2017-07-02 ENCOUNTER — Ambulatory Visit (HOSPITAL_COMMUNITY): Payer: Medicaid Other | Admitting: Anesthesiology

## 2017-07-02 ENCOUNTER — Ambulatory Visit (HOSPITAL_COMMUNITY)
Admission: RE | Admit: 2017-07-02 | Discharge: 2017-07-02 | Disposition: A | Discharge status: Home / Self Care | Payer: Medicaid Other | Source: Ambulatory Visit | Attending: Obstetrics and Gynecology | Admitting: Obstetrics and Gynecology

## 2017-07-02 ENCOUNTER — Encounter (HOSPITAL_COMMUNITY)
Admission: RE | Disposition: A | Discharge status: Home / Self Care | Payer: Self-pay | Source: Ambulatory Visit | Attending: Obstetrics and Gynecology

## 2017-07-02 DIAGNOSIS — Z9889 Other specified postprocedural states: Secondary | ICD-10-CM | POA: Insufficient documentation

## 2017-07-02 DIAGNOSIS — Z8249 Family history of ischemic heart disease and other diseases of the circulatory system: Secondary | ICD-10-CM | POA: Insufficient documentation

## 2017-07-02 DIAGNOSIS — Z825 Family history of asthma and other chronic lower respiratory diseases: Secondary | ICD-10-CM | POA: Insufficient documentation

## 2017-07-02 DIAGNOSIS — D573 Sickle-cell trait: Secondary | ICD-10-CM

## 2017-07-02 DIAGNOSIS — Z302 Encounter for sterilization: Secondary | ICD-10-CM | POA: Diagnosis not present

## 2017-07-02 HISTORY — PX: LAPAROSCOPIC TUBAL LIGATION: SHX1937

## 2017-07-02 LAB — PREGNANCY, URINE: PREG TEST UR: NEGATIVE

## 2017-07-02 SURGERY — LIGATION, FALLOPIAN TUBE, LAPAROSCOPIC
Anesthesia: General | Laterality: Bilateral

## 2017-07-02 MED ORDER — PROPOFOL 10 MG/ML IV BOLUS
INTRAVENOUS | Status: DC | PRN
  Administered 2017-07-02: 200 mg via INTRAVENOUS

## 2017-07-02 MED ORDER — FENTANYL CITRATE (PF) 100 MCG/2ML IJ SOLN
25.0000 ug | INTRAMUSCULAR | Status: DC | PRN
  Administered 2017-07-02: 50 ug via INTRAVENOUS
  Administered 2017-07-02: 25 ug via INTRAVENOUS

## 2017-07-02 MED ORDER — MIDAZOLAM HCL 5 MG/5ML IJ SOLN
INTRAMUSCULAR | Status: DC | PRN
  Administered 2017-07-02: 2 mg via INTRAVENOUS

## 2017-07-02 MED ORDER — FENTANYL CITRATE (PF) 100 MCG/2ML IJ SOLN
INTRAMUSCULAR | Status: AC
  Filled 2017-07-02: qty 2

## 2017-07-02 MED ORDER — DEXAMETHASONE SODIUM PHOSPHATE 10 MG/ML IJ SOLN
INTRAMUSCULAR | Status: AC
  Filled 2017-07-02: qty 1

## 2017-07-02 MED ORDER — FENTANYL CITRATE (PF) 100 MCG/2ML IJ SOLN
INTRAMUSCULAR | Status: DC | PRN
  Administered 2017-07-02 (×2): 100 ug via INTRAVENOUS

## 2017-07-02 MED ORDER — ACETAMINOPHEN 325 MG PO TABS: 325.0000 mg | ORAL_TABLET | ORAL | Status: DC | PRN

## 2017-07-02 MED ORDER — MEPERIDINE HCL 25 MG/ML IJ SOLN: 6.2500 mg | INTRAMUSCULAR | Status: DC | PRN

## 2017-07-02 MED ORDER — IBUPROFEN 800 MG PO TABS: 800.0000 mg | ORAL_TABLET | Freq: Three times a day (TID) | ORAL | 0 refills | Status: DC | PRN

## 2017-07-02 MED ORDER — KETOROLAC TROMETHAMINE 30 MG/ML IJ SOLN: 30.0000 mg | Freq: Once | INTRAMUSCULAR | Status: DC | PRN

## 2017-07-02 MED ORDER — PROPOFOL 10 MG/ML IV BOLUS
INTRAVENOUS | Status: AC
  Filled 2017-07-02: qty 20

## 2017-07-02 MED ORDER — PROMETHAZINE HCL 25 MG/ML IJ SOLN: 6.2500 mg | INTRAMUSCULAR | Status: DC | PRN

## 2017-07-02 MED ORDER — MIDAZOLAM HCL 2 MG/2ML IJ SOLN
INTRAMUSCULAR | Status: AC
  Filled 2017-07-02: qty 2

## 2017-07-02 MED ORDER — SOD CITRATE-CITRIC ACID 500-334 MG/5ML PO SOLN
ORAL | Status: AC
Start: 2017-07-02 — End: 2017-07-02
  Administered 2017-07-02: 30 mL via ORAL
  Filled 2017-07-02: qty 15

## 2017-07-02 MED ORDER — SCOPOLAMINE 1 MG/3DAYS TD PT72
MEDICATED_PATCH | TRANSDERMAL | Status: AC
  Administered 2017-07-02: 1.5 mg via TRANSDERMAL
  Filled 2017-07-02: qty 1

## 2017-07-02 MED ORDER — LIDOCAINE HCL (CARDIAC) 20 MG/ML IV SOLN
INTRAVENOUS | Status: DC | PRN
  Administered 2017-07-02: 30 mg via INTRAVENOUS

## 2017-07-02 MED ORDER — DEXAMETHASONE SODIUM PHOSPHATE 4 MG/ML IJ SOLN
INTRAMUSCULAR | Status: AC
  Filled 2017-07-02: qty 1

## 2017-07-02 MED ORDER — LACTATED RINGERS IV SOLN
INTRAVENOUS | Status: DC
  Administered 2017-07-02: 16:00:00 via INTRAVENOUS

## 2017-07-02 MED ORDER — SUGAMMADEX SODIUM 200 MG/2ML IV SOLN
INTRAVENOUS | Status: AC
  Filled 2017-07-02: qty 2

## 2017-07-02 MED ORDER — KETOROLAC TROMETHAMINE 30 MG/ML IJ SOLN
INTRAMUSCULAR | Status: AC
  Filled 2017-07-02: qty 1

## 2017-07-02 MED ORDER — SOD CITRATE-CITRIC ACID 500-334 MG/5ML PO SOLN
30.0000 mL | ORAL | Status: AC
  Administered 2017-07-02: 30 mL via ORAL

## 2017-07-02 MED ORDER — ROCURONIUM BROMIDE 100 MG/10ML IV SOLN
INTRAVENOUS | Status: AC
  Filled 2017-07-02: qty 1

## 2017-07-02 MED ORDER — KETOROLAC TROMETHAMINE 30 MG/ML IJ SOLN
INTRAMUSCULAR | Status: DC | PRN
  Administered 2017-07-02: 30 mg via INTRAVENOUS

## 2017-07-02 MED ORDER — SUGAMMADEX SODIUM 200 MG/2ML IV SOLN
INTRAVENOUS | Status: DC | PRN
  Administered 2017-07-02: 200 mg via INTRAVENOUS

## 2017-07-02 MED ORDER — LIDOCAINE HCL (CARDIAC) 20 MG/ML IV SOLN
INTRAVENOUS | Status: AC
  Filled 2017-07-02: qty 5

## 2017-07-02 MED ORDER — BUPIVACAINE HCL 0.5 % IJ SOLN
INTRAMUSCULAR | Status: DC | PRN
  Administered 2017-07-02: 10 mL

## 2017-07-02 MED ORDER — LACTATED RINGERS IV SOLN
INTRAVENOUS | Status: DC
  Administered 2017-07-02 (×2): via INTRAVENOUS

## 2017-07-02 MED ORDER — OXYCODONE HCL 5 MG PO TABS
5.0000 mg | ORAL_TABLET | Freq: Once | ORAL | Status: DC | PRN
Start: 2017-07-02 — End: 2017-07-02

## 2017-07-02 MED ORDER — DEXAMETHASONE SODIUM PHOSPHATE 10 MG/ML IJ SOLN
INTRAMUSCULAR | Status: DC | PRN
  Administered 2017-07-02: 10 mg via INTRAVENOUS

## 2017-07-02 MED ORDER — HYDROCODONE-ACETAMINOPHEN 5-325 MG PO TABS: 1.0000 | ORAL_TABLET | Freq: Four times a day (QID) | ORAL | 0 refills | Status: DC | PRN

## 2017-07-02 MED ORDER — SCOPOLAMINE 1 MG/3DAYS TD PT72
1.0000 | MEDICATED_PATCH | Freq: Once | TRANSDERMAL | Status: DC
  Administered 2017-07-02: 1.5 mg via TRANSDERMAL

## 2017-07-02 MED ORDER — ONDANSETRON HCL 4 MG/2ML IJ SOLN
INTRAMUSCULAR | Status: DC | PRN
  Administered 2017-07-02: 4 mg via INTRAVENOUS

## 2017-07-02 MED ORDER — OXYCODONE HCL 5 MG/5ML PO SOLN: 5.0000 mg | Freq: Once | ORAL | Status: DC | PRN

## 2017-07-02 MED ORDER — FENTANYL CITRATE (PF) 250 MCG/5ML IJ SOLN
INTRAMUSCULAR | Status: AC
  Filled 2017-07-02: qty 5

## 2017-07-02 MED ORDER — BUPIVACAINE HCL (PF) 0.5 % IJ SOLN
INTRAMUSCULAR | Status: AC
  Filled 2017-07-02: qty 30

## 2017-07-02 MED ORDER — ONDANSETRON HCL 4 MG/2ML IJ SOLN
INTRAMUSCULAR | Status: AC
  Filled 2017-07-02: qty 2

## 2017-07-02 MED ORDER — ACETAMINOPHEN 160 MG/5ML PO SOLN: 325.0000 mg | ORAL | Status: DC | PRN

## 2017-07-02 SURGICAL SUPPLY — 26 items
CATH ROBINSON RED A/P 16FR (CATHETERS) ×3 IMPLANT
CLIP FILSHIE TUBAL LIGA STRL (Clip) ×3 IMPLANT
CLOTH BEACON ORANGE TIMEOUT ST (SAFETY) ×3 IMPLANT
DERMABOND ADVANCED (GAUZE/BANDAGES/DRESSINGS) ×2
DERMABOND ADVANCED .7 DNX12 (GAUZE/BANDAGES/DRESSINGS) ×1 IMPLANT
DRSG OPSITE POSTOP 3X4 (GAUZE/BANDAGES/DRESSINGS) ×3 IMPLANT
DURAPREP 26ML APPLICATOR (WOUND CARE) ×3 IMPLANT
GLOVE BIO SURGEON STRL SZ7.5 (GLOVE) ×3 IMPLANT
GLOVE BIOGEL PI IND STRL 7.0 (GLOVE) ×2 IMPLANT
GLOVE BIOGEL PI IND STRL 8 (GLOVE) ×1 IMPLANT
GLOVE BIOGEL PI INDICATOR 7.0 (GLOVE) ×4
GLOVE BIOGEL PI INDICATOR 8 (GLOVE) ×2
GOWN STRL REUS W/TWL LRG LVL3 (GOWN DISPOSABLE) ×3 IMPLANT
GOWN STRL REUS W/TWL XL LVL3 (GOWN DISPOSABLE) ×3 IMPLANT
NS IRRIG 1000ML POUR BTL (IV SOLUTION) ×3 IMPLANT
PACK LAPAROSCOPY BASIN (CUSTOM PROCEDURE TRAY) ×3 IMPLANT
PACK TRENDGUARD 450 HYBRID PRO (MISCELLANEOUS) ×1 IMPLANT
PACK TRENDGUARD 600 HYBRD PROC (MISCELLANEOUS) IMPLANT
PROTECTOR NERVE ULNAR (MISCELLANEOUS) ×6 IMPLANT
SUT MNCRL AB 4-0 PS2 18 (SUTURE) ×3 IMPLANT
SUT VICRYL 0 UR6 27IN ABS (SUTURE) ×6 IMPLANT
TOWEL OR 17X24 6PK STRL BLUE (TOWEL DISPOSABLE) ×6 IMPLANT
TRENDGUARD 450 HYBRID PRO PACK (MISCELLANEOUS) ×3
TRENDGUARD 600 HYBRID PROC PK (MISCELLANEOUS)
TROCAR BALLN 12MMX100 BLUNT (TROCAR) ×3 IMPLANT
WARMER LAPAROSCOPE (MISCELLANEOUS) ×3 IMPLANT

## 2017-07-02 NOTE — Discharge Instructions (Signed)
Diagnostic Laparoscopy, Care After °Refer to this sheet in the next few weeks. These instructions provide you with information about caring for yourself after your procedure. Your health care provider may also give you more specific instructions. Your treatment has been planned according to current medical practices, but problems sometimes occur. Call your health care provider if you have any problems or questions after your procedure. °What can I expect after the procedure? °After your procedure, it is common to have mild discomfort in the throat and abdomen. °Follow these instructions at home: °· Take over-the-counter and prescription medicines only as told by your health care provider. °· Do not drive for 24 hours if you received a sedative. °· Return to your normal activities as told by your health care provider. °· Do not take baths, swim, or use a hot tub until your health care provider approves. You may shower. °· Follow instructions from your health care provider about how to take care of your incision. Make sure you: °? Wash your hands with soap and water before you change your bandage (dressing). If soap and water are not available, use hand sanitizer. °? Change your dressing as told by your health care provider. °? Leave stitches (sutures), skin glue, or adhesive strips in place. These skin closures may need to stay in place for 2 weeks or longer. If adhesive strip edges start to loosen and curl up, you may trim the loose edges. Do not remove adhesive strips completely unless your health care provider tells you to do that. °· Check your incision area every day for signs of infection. Check for: °? More redness, swelling, or pain. °? More fluid or blood. °? Warmth. °? Pus or a bad smell. °· It is your responsibility to get the results of your procedure. Ask your health care provider or the department performing the procedure when your results will be ready. °Contact a health care provider if: °· There is  new pain in your shoulders. °· You feel light-headed or faint. °· You are unable to pass gas or unable to have a bowel movement. °· You feel nauseous or you vomit. °· You develop a rash. °· You have more redness, swelling, or pain around your incision. °· You have more fluid or blood coming from your incision. °· Your incision feels warm to the touch. °· You have pus or a bad smell coming from your incision. °· You have a fever or chills. °Get help right away if: °· Your pain is getting worse. °· You have ongoing vomiting. °· The edges of your incision open up. °· You have trouble breathing. °· You have chest pain. °This information is not intended to replace advice given to you by your health care provider. Make sure you discuss any questions you have with your health care provider. °Document Released: 11/21/2015 Document Revised: 05/17/2016 Document Reviewed: 08/23/2015 °Elsevier Interactive Patient Education © 2018 [employer] ° ° °DISCHARGE INSTRUCTIONS: Laparoscopy ° °The following instructions have been prepared to help you care for yourself upon your return home today. ° °Wound care: °• Do not get the incision wet for the first 24 hours. The incision should be kept clean and dry. °• The Band-Aids or dressings may be removed the day after surgery. °• Should the incision become sore, red, and swollen after the first week, check with your doctor. ° °Personal hygiene: °• Shower the day after your procedure. ° °Activity and limitations: °• Do NOT drive or operate any equipment today. °• Do   NOT lift anything more than 15 pounds for 2-3 weeks after surgery. °• Do NOT rest in bed all day. °• Walking is encouraged. Walk each day, starting slowly with 5-minute walks 3 or 4 times a day. Slowly increase the length of your walks. °• Walk up and down stairs slowly. °• Do NOT do strenuous activities, such as golfing, playing tennis, bowling, running, biking, weight lifting, gardening, mowing, or vacuuming for 2-4 weeks.  Ask your doctor when it is okay to start. ° °Diet: Eat a light meal as desired this evening. You may resume your usual diet tomorrow. ° °Return to work: This is dependent on the type of work you do. For the most part you can return to a desk job within a week of surgery. If you are more active at work, please discuss this with your doctor. ° °What to expect after your surgery: You may have a slight burning sensation when you urinate on the first day. You may have a very small amount of blood in the urine. Expect to have a small amount of vaginal discharge/light bleeding for 1-2 weeks. It is not unusual to have abdominal soreness and bruising for up to 2 weeks. You may be tired and need more rest for about 1 week. You may experience shoulder pain for 24-72 hours. Lying flat in bed may relieve it. ° °Call your doctor for any of the following: °• Develop a fever of 100.4 or greater °• Inability to urinate 6 hours after discharge from hospital °• Severe pain not relieved by pain medications °• Persistent of heavy bleeding at incision site °• Redness or swelling around incision site after a week °• Increasing nausea or vomiting ° °Patient Signature________________________________________ °Nurse Signature_________________________________________ ° ° ° °Post Anesthesia Home Care Instructions ° °Activity: °Get plenty of rest for the remainder of the day. A responsible individual must stay with you for 24 hours following the procedure.  °For the next 24 hours, DO NOT: °-Drive a car °-Operate machinery °-Drink alcoholic beverages °-Take any medication unless instructed by your physician °-Make any legal decisions or sign important papers. ° °Meals: °Start with liquid foods such as gelatin or soup. Progress to regular foods as tolerated. Avoid greasy, spicy, heavy foods. If nausea and/or vomiting occur, drink only clear liquids until the nausea and/or vomiting subsides. Call your physician if vomiting continues. ° °Special  Instructions/Symptoms: °Your throat may feel dry or sore from the anesthesia or the breathing tube placed in your throat during surgery. If this causes discomfort, gargle with warm salt water. The discomfort should disappear within 24 hours. ° °If you had a scopolamine patch placed behind your ear for the management of post- operative nausea and/or vomiting: ° °1. The medication in the patch is effective for 72 hours, after which it should be removed.  Wrap patch in a tissue and discard in the trash. Wash hands thoroughly with soap and water. °2. You may remove the patch earlier than 72 hours if you experience unpleasant side effects which may include dry mouth, dizziness or visual disturbances. °3. Avoid touching the patch. Wash your hands with soap and water after contact with the patch. °  ° ° °

## 2017-07-02 NOTE — Anesthesia Preprocedure Evaluation (Signed)
Anesthesia Evaluation  Patient identified by MRN, date of birth, ID band Patient awake    Reviewed: Allergy & Precautions, NPO status , Patient's Chart, lab work & pertinent test results  Airway Mallampati: I       Dental no notable dental hx. (+) Teeth Intact   Pulmonary    Pulmonary exam normal breath sounds clear to auscultation       Cardiovascular hypertension, Normal cardiovascular exam Rhythm:Regular Rate:Normal     Neuro/Psych negative psych ROS   GI/Hepatic negative GI ROS, Neg liver ROS,   Endo/Other  negative endocrine ROS  Renal/GU negative Renal ROS  negative genitourinary   Musculoskeletal negative musculoskeletal ROS (+)   Abdominal Normal abdominal exam  (+)   Peds  Hematology  (+) anemia ,   Anesthesia Other Findings   Reproductive/Obstetrics                             Anesthesia Physical Anesthesia Plan  ASA: II  Anesthesia Plan: General   Post-op Pain Management:    Induction: Intravenous  PONV Risk Score and Plan: 4 or greater and Ondansetron, Dexamethasone, Propofol, Midazolam and Scopolamine patch - Pre-op  Airway Management Planned: Oral ETT  Additional Equipment:   Intra-op Plan:   Post-operative Plan: Extubation in OR  Informed Consent: I have reviewed the patients History and Physical, chart, labs and discussed the procedure including the risks, benefits and alternatives for the proposed anesthesia with the patient or authorized representative who has indicated his/her understanding and acceptance.   Dental advisory given  Plan Discussed with: CRNA and Surgeon  Anesthesia Plan Comments:         Anesthesia Quick Evaluation

## 2017-07-02 NOTE — Interval H&P Note (Signed)
History and Physical Interval Note:  07/02/2017 12:39 PM  Stacey Reid  has presented today for surgery, with the diagnosis of Undesired Fertility  The various methods of treatment have been discussed with the patient and family. After consideration of risks, benefits and other options for treatment, the patient has consented to  Procedure(s): LAPAROSCOPIC TUBAL LIGATION - Filshie clips (Bilateral) as a surgical intervention .  The patient's history has been reviewed, patient examined, no change in status, stable for surgery.  I have reviewed the patient's chart and labs.  Questions were answered to the patient's satisfaction.     Hermina StaggersMichael L Cristin Penaflor

## 2017-07-02 NOTE — Interval H&P Note (Signed)
History and Physical Interval Note:  07/02/2017 12:39 PM  Stacey Reid  has presented today for surgery, with the diagnosis of Undesired Fertility  The various methods of treatment have been discussed with the patient and family. After consideration of risks, benefits and other options for treatment, the patient has consented to  Procedure(s): LAPAROSCOPIC TUBAL LIGATION - Filshie clips (Bilateral) as a surgical intervention .  The patient's history has been reviewed, patient examined, no change in status, stable for surgery.  I have reviewed the patient's chart and labs.  Questions were answered to the patient's satisfaction.     [name] L [name]   

## 2017-07-02 NOTE — Transfer of Care (Signed)
Immediate Anesthesia Transfer of Care Note  Patient: Stacey Reid  Procedure(s) Performed: Procedure(s): LAPAROSCOPIC TUBAL LIGATION - Filshie clips (Bilateral)  Patient Location: PACU  Anesthesia Type:General  Level of Consciousness: awake, alert , oriented and patient cooperative  Airway & Oxygen Therapy: Patient Spontanous Breathing and Patient connected to nasal cannula oxygen  Post-op Assessment: Report given to RN, Post -op Vital signs reviewed and stable and Patient moving all extremities X 4  Post vital signs: Reviewed and stable  Last Vitals:  Vitals:   07/02/17 1126  BP: 119/82  Pulse: 63  Resp: 16  Temp: 36.8 C    Last Pain:  Vitals:   07/02/17 1126  TempSrc: Oral      Patients Stated Pain Goal: 4 (07/02/17 1126)  Complications: No apparent anesthesia complications

## 2017-07-02 NOTE — Anesthesia Procedure Notes (Signed)
Procedure Name: Intubation Date/Time: 07/02/2017 1:10 PM Performed by: Gilmer Mor R Pre-anesthesia Checklist: Patient identified, Patient being monitored, Timeout performed, Emergency Drugs available and Suction available Patient Re-evaluated:Patient Re-evaluated prior to inductionOxygen Delivery Method: Circle System Utilized Preoxygenation: Pre-oxygenation with 100% oxygen Intubation Type: IV induction Ventilation: Mask ventilation without difficulty Laryngoscope Size: Mac and 3 Grade View: Grade II Tube type: Oral Tube size: 7.0 mm Number of attempts: 1 Airway Equipment and Method: stylet Placement Confirmation: ETT inserted through vocal cords under direct vision,  positive ETCO2 and breath sounds checked- equal and bilateral Secured at: 21 cm Tube secured with: Tape Dental Injury: Teeth and Oropharynx as per pre-operative assessment

## 2017-07-02 NOTE — Op Note (Signed)
Stacey BorrowLavenia M Reid 07/02/2017  PREOPERATIVE DIAGNOSIS:  Undesired fertility  POSTOPERATIVE DIAGNOSIS:  Undesired fertility  PROCEDURE:  Laparoscopic Bilateral Tubal Sterilization with Fischer clips  ANESTHESIA:  General endotracheal  SURGEON: Wadsworth Skolnick L. Alysia PennaErvin, MD  COMPLICATIONS:  None immediate.  ESTIMATED BLOOD LOSS:  Less than 10 ml.  FLUIDS: 1000 ml LR.  URINE OUTPUT:  As recorded  INDICATIONS: 21 y.o. [id number]  with undesired fertility, desires permanent sterilization. Other reversible forms of contraception were discussed with patient; she declines all other modalities.  Risks of procedure discussed with patient including permanence of method, bleeding, infection, injury to surrounding organs and need for additional procedures including laparotomy, risk of regret.  Failure risk of 0.5-1% with increased risk of ectopic gestation if pregnancy occurs was also discussed with patient.      FINDINGS:  Normal uterus, tubes, and ovaries.  TECHNIQUE:  The patient was taken to the operating room where general anesthesia was obtained without difficulty.  She was then placed in the dorsal lithotomy position and prepared and draped in sterile fashion.  After an adequate timeout was performed, a bivalved speculum was then placed in the patient's vagina, and the anterior lip of cervix grasped with the single-tooth tenaculum.  The uterine manipulator was then advanced into the uterus.  The speculum was removed from the vagina.  Attention was then turned to the patient's abdomen where a skin incision was made in the umbilical fold. The fascia was identified and grasped with Kocher clamps. The fascia was excised. The fascial conner were secured with 2/0 Vicryl. Hosan trocar then was easily placed.  The abdomen was then insufflated with carbon dioxide gas and adequate pneumoperitoneum was obtained.  A survey of the patient's pelvis and abdomen revealed entirely normal anatomy.  The fallopian tubes were  observed and found to be normal in appearance. A flische clip then was placed mid portion of the fallopian tube on both sides. The instruments were then removed from the patient's abdomen and the fascial incision was closed with the 2/0 Vicryl. The skin incision was closed with 4/0 Vicryl. The uterine manipulator and the tenaculum were removed from the vagina without complications. The patient tolerated the procedure well.  Sponge, lap, and needle counts were correct times two.  The patient was then taken to the recovery room awake, extubated and in stable condition.

## 2017-07-03 ENCOUNTER — Encounter (HOSPITAL_COMMUNITY): Payer: Self-pay | Admitting: Obstetrics and Gynecology

## 2017-07-04 NOTE — Anesthesia Postprocedure Evaluation (Signed)
Anesthesia Post Note  Patient: Stacey Reid  Procedure(s) Performed: Procedure(s) (LRB): LAPAROSCOPIC TUBAL LIGATION - Filshie clips (Bilateral)     Patient location during evaluation: PACU Anesthesia Type: General Level of consciousness: awake Pain management: pain level controlled Vital Signs Assessment: post-procedure vital signs reviewed and stable Respiratory status: spontaneous breathing Cardiovascular status: stable Postop Assessment: no signs of nausea or vomiting Anesthetic complications: no    Last Vitals:  Vitals:   07/02/17 1615 07/02/17 1704  BP: 140/90 (!) 130/92  Pulse: (!) 59 (!) 58  Resp: 16 18  Temp: 36.9 C     Last Pain:  Vitals:   07/02/17 1704  TempSrc:   PainSc: 3    Pain Goal: Patients Stated Pain Goal: 4 (07/02/17 1126)               Xochitl Egle JR,JOHN Susann GivensFRANKLIN

## 2017-07-09 ENCOUNTER — Encounter (HOSPITAL_COMMUNITY): Payer: Self-pay

## 2017-07-16 ENCOUNTER — Ambulatory Visit (INDEPENDENT_AMBULATORY_CARE_PROVIDER_SITE_OTHER): Payer: Medicaid Other | Admitting: Obstetrics and Gynecology

## 2017-07-16 ENCOUNTER — Encounter: Payer: Self-pay | Admitting: Obstetrics and Gynecology

## 2017-07-16 VITALS — BP 124/76 | HR 75 | Temp 97.6°F | Ht 63.0 in | Wt 164.0 lb

## 2017-07-16 DIAGNOSIS — Z9889 Other specified postprocedural states: Secondary | ICD-10-CM | POA: Insufficient documentation

## 2017-07-16 DIAGNOSIS — O139 Gestational [pregnancy-induced] hypertension without significant proteinuria, unspecified trimester: Secondary | ICD-10-CM

## 2017-07-16 NOTE — Patient Instructions (Signed)

## 2017-07-16 NOTE — Progress Notes (Signed)
No concerns per pt 

## 2017-07-16 NOTE — Progress Notes (Signed)
Patient ID: Stacey BrasilLavenia M Chovanec, female   DOB: September 25, 1996, 21 y.o.   MRN: [id number] Post OP Visit  Ms Arbutus PedLetterlough is here for post op visit. Pt had lap BTL with Flische clips on July 10. She has no complaints today. Denies any bowel or bladder dysfunction.   Off meds for GHTN  PE AF VSS Lungs clear Heart RRR Abd soft + BS incision well healed  A/P Post OP lap BTL        GHTN Doing well. BP normal without meds. Return to normal ADL's. F/U in 1 yr or PRN.

## 2017-10-20 ENCOUNTER — Encounter (HOSPITAL_COMMUNITY): Payer: Self-pay

## 2017-10-20 ENCOUNTER — Inpatient Hospital Stay (HOSPITAL_COMMUNITY)
Admission: AD | Admit: 2017-10-20 | Discharge: 2017-10-20 | Disposition: A | Discharge status: Home / Self Care | Payer: Medicaid Other | Source: Ambulatory Visit | Attending: Obstetrics and Gynecology | Admitting: Obstetrics and Gynecology

## 2017-10-20 DIAGNOSIS — R1030 Lower abdominal pain, unspecified: Secondary | ICD-10-CM

## 2017-10-20 DIAGNOSIS — Z9851 Tubal ligation status: Secondary | ICD-10-CM | POA: Insufficient documentation

## 2017-10-20 DIAGNOSIS — Z9889 Other specified postprocedural states: Secondary | ICD-10-CM | POA: Insufficient documentation

## 2017-10-20 LAB — URINALYSIS, ROUTINE W REFLEX MICROSCOPIC
Bilirubin Urine: NEGATIVE
Glucose, UA: NEGATIVE mg/dL
Hgb urine dipstick: NEGATIVE
Ketones, ur: NEGATIVE mg/dL
LEUKOCYTES UA: NEGATIVE
NITRITE: NEGATIVE
PROTEIN: NEGATIVE mg/dL
Specific Gravity, Urine: 1.014 (ref 1.005–1.030)
pH: 5 (ref 5.0–8.0)

## 2017-10-20 LAB — WET PREP, GENITAL
CLUE CELLS WET PREP: NONE SEEN
Sperm: NONE SEEN
TRICH WET PREP: NONE SEEN
YEAST WET PREP: NONE SEEN

## 2017-10-20 LAB — POCT PREGNANCY, URINE: PREG TEST UR: NEGATIVE

## 2017-10-20 NOTE — Progress Notes (Addendum)
G3P2. Nonpregnant. Presents to triage for abd pain and vaginal discharge. Has urgency to want to void. Denies bleeding.   Dr. Doroteo GlassmanPhelps at bs assessing pt. GC, wetprep, and pelvic done.  1219: Discharge instructions given with pt understanding. Pt left unit via ambulatory with her two children.

## 2017-10-20 NOTE — MAU Provider Note (Signed)
Chief Complaint: No chief complaint on file.   SUBJECTIVE HPI: ADELIA BAPTISTA is a 21 y.o. [id number] who presents to MAU with complaints of lower abdominal pain.  She states that pain has been off and on for the last 2 weeks.  She also mentions that she has some increased urinary urgency and some slight dysuria with urination.  Patient is also concerned because she is 4 days late for her menstrual cycle.  She is s/p tubal ligation.  States that her menstrual cycles are consistent monthly.  Patient denies any fevers, nausea vomiting, bleeding, diarrhea.  Patient states that she has had some increased discharge.  Denies any odor to discharge or color changes.  Patient is sexually active.  H/o ovarian cyst, dysmenorrhea.   Past Medical History:  Diagnosis Date  . Anemia affecting pregnancy 03/21/2017  . Asthma    years ago, exercise induced  . Dyspnea    with pregnancy  . Headache    with pregnancy  . Heart murmur    history of, resolved 3 months later  . Hyperemesis 2016  . Hypertension   . Pregnancy induced hypertension   . Syncope    in high school, found out had a heart murmur   OB History  Gravida Para Term Preterm AB Living  3 2 2  0 1 2  SAB TAB Ectopic Multiple Live Births  1 0 0 0 2    # Outcome Date GA Lbr Len/2nd Weight Sex Delivery Anes PTL Lv  3 Term 05/29/17 [redacted]w[redacted]d / 00:18 3.295 kg (7 lb 4.2 oz) F Vag-Spont EPI  LIV  2 SAB 01/29/16 [redacted]w[redacted]d         1 Term 04/02/15 [redacted]w[redacted]d 413:16 / 01:08 2.651 kg (5 lb 13.5 oz) M Vag-Spont EPI, Local  LIV     Past Surgical History:  Procedure Laterality Date  . LAPAROSCOPIC TUBAL LIGATION Bilateral 07/02/2017   Procedure: LAPAROSCOPIC TUBAL LIGATION - Filshie clips;  Surgeon: Hermina Staggers, MD;  Location: [facility];  Service: Gynecology;  Laterality: Bilateral;  . WISDOM TOOTH EXTRACTION     Social History   Social History  . Marital status: Single    Spouse name: N/A  . Number of children: N/A  . Years of education: N/A    Occupational History  . Not on file.   Social History Main Topics  . Smoking status: Never Smoker  . Smokeless tobacco: Never Used  . Alcohol use Yes     Comment: occ.  . Drug use: No  . Sexual activity: Yes    Birth control/ protection: Surgical     Comment: BTL    Other Topics Concern  . Not on file   Social History Narrative  . No narrative on file   No current facility-administered medications on file prior to encounter.    No current outpatient prescriptions on file prior to encounter.   No Known Allergies  I have reviewed the past Medical Hx, Surgical Hx, Social Hx, Allergies and Medications.   REVIEW OF SYSTEMS All systems reviewed and are negative for acute change except as noted in the HPI.   OBJECTIVE BP 112/71 (BP Location: Right Arm)   Pulse 86   Temp 98.5 F (36.9 C) (Oral)   Resp 16   Wt 73.8 kg (162 lb 12 oz)   LMP 09/16/2017   SpO2 100%   BMI 28.83 kg/m     PHYSICAL EXAM Constitutional: Well-developed, well-nourished female in no acute distress.  Cardiovascular: normal rate  and rhythm, pulses intact Respiratory: normal rate and effort.  GI: Abd soft, mild tenderness in RLQ, non-distended. No guarding or rebound tenderness. MS: Extremities nontender, no edema, normal ROM Neurologic: Alert and oriented x 4. No focal deficits GU: Neg CVAT. SPECULUM EXAM: NEFG, physiologic discharge, no blood noted, cervix clean BIMANUAL: uterus normal size, no adnexal tenderness or masses. No CMT. Psych: normal mood and affect  LAB RESULTS Results for orders placed or performed during the hospital encounter of 10/20/17 (from the past 24 hour(s))  Urinalysis, Routine w reflex microscopic     Status: Abnormal   Collection Time: 10/20/17 11:14 AM  Result Value Ref Range   Color, Urine YELLOW YELLOW   APPearance HAZY (A) CLEAR   Specific Gravity, Urine 1.014 1.005 - 1.030   pH 5.0 5.0 - 8.0   Glucose, UA NEGATIVE NEGATIVE mg/dL   Hgb urine dipstick  NEGATIVE NEGATIVE   Bilirubin Urine NEGATIVE NEGATIVE   Ketones, ur NEGATIVE NEGATIVE mg/dL   Protein, ur NEGATIVE NEGATIVE mg/dL   Nitrite NEGATIVE NEGATIVE   Leukocytes, UA NEGATIVE NEGATIVE  Pregnancy, urine POC     Status: None   Collection Time: 10/20/17 11:22 AM  Result Value Ref Range   Preg Test, Ur NEGATIVE NEGATIVE  Wet prep, genital     Status: Abnormal   Collection Time: 10/20/17 11:37 AM  Result Value Ref Range   Yeast Wet Prep HPF POC NONE SEEN NONE SEEN   Trich, Wet Prep NONE SEEN NONE SEEN   Clue Cells Wet Prep HPF POC NONE SEEN NONE SEEN   WBC, Wet Prep HPF POC MODERATE (A) NONE SEEN   Sperm NONE SEEN     IMAGING No results found.  MAU COURSE Vitals and nursing notes reviewed I have ordered labs and reviewed them Upreg negative UA without signs of UTI Wet prep normal Treatments given in MAU: patient declined pain medication  MDM Plan of care reviewed with patient, including labs and tests ordered and medical treatment.   ASSESSMENT 1. Lower abdominal pain    Differential includes physiologic ovarian cyst vs dysmenorrhea prior to onset of menses. No infectious source identified.   PLAN Discharge home in stable condition Counseled on return precautions PRN ibuprofen for discomfort Handout given   Caryl AdaJazma Phelps, DO OB Fellow [facility], [facility] [place] 10/20/2017, 11:29 AM

## 2017-10-20 NOTE — Discharge Instructions (Signed)
Dysfunctional Uterine Bleeding °Dysfunctional uterine bleeding is abnormal bleeding from the uterus. Dysfunctional uterine bleeding includes: °· A period that comes earlier or later than usual. °· A period that is lighter, heavier, or has blood clots. °· Bleeding between periods. °· Skipping one or more periods. °· Bleeding after sexual intercourse. °· Bleeding after menopause. ° °Follow these instructions at home: °Pay attention to any changes in your symptoms. Follow these instructions to help with your condition: °Eating and drinking °· Eat well-balanced meals. Include foods that are high in iron, such as liver, meat, shellfish, green leafy vegetables, and eggs. °· If you become constipated: °? Drink plenty of water. °? Eat fruits and vegetables that are high in water and fiber, such as spinach, carrots, raspberries, apples, and mango. °Medicines °· Take over-the-counter and prescription medicines only as told by your health care provider. °· Do not change medicines without talking with your health care provider. °· Aspirin or medicines that contain aspirin may make the bleeding worse. Do not take those medicines: °? During the week before your period. °? During your period. °· If you were prescribed iron pills, take them as told by your health care provider. Iron pills help to replace iron that your body loses because of this condition. °Activity °· If you need to change your sanitary pad or tampon more than one time every 2 hours: °? Lie in bed with your feet raised (elevated). °? Place a cold pack on your lower abdomen. °? Rest as much as possible until the bleeding stops or slows down. °· Do not try to lose weight until the bleeding has stopped and your blood iron level is back to normal. °Other Instructions °· For two months, write down: °? When your period starts. °? When your period ends. °? When any abnormal bleeding occurs. °? What problems you notice. °· Keep all follow up visits as told by your health  care provider. This is important. °Contact a health care provider if: °· You get light-headed or weak. °· You have nausea and vomiting. °· You cannot eat or drink without vomiting. °· You feel dizzy or have diarrhea while you are taking medicines. °· You are taking birth control pills or hormones, and you want to change them or stop taking them. °Get help right away if: °· You develop a fever or chills. °· You need to change your sanitary pad or tampon more than one time per hour. °· Your bleeding becomes heavier, or your flow contains clots more often. °· You develop pain in your abdomen. °· You lose consciousness. °· You develop a rash. °This information is not intended to replace advice given to you by your health care provider. Make sure you discuss any questions you have with your health care provider. °Document Released: 12/07/2000 Document Revised: 05/17/2016 Document Reviewed: 03/07/2015 °Elsevier Interactive Patient Education © 2018 Elsevier Inc. ° °

## 2017-10-20 NOTE — MAU Note (Signed)
Had tubes tied in June.  Has been having regular periods.  staretd having pain in lower abd, off and on for 2 wks.  Having urgency with urination. Been having lower back pain.  Period is 4 days late.

## 2017-10-21 LAB — GC/CHLAMYDIA PROBE AMP (~~LOC~~) NOT AT ARMC
Chlamydia: NEGATIVE
Neisseria Gonorrhea: NEGATIVE

## 2017-10-23 ENCOUNTER — Ambulatory Visit (INDEPENDENT_AMBULATORY_CARE_PROVIDER_SITE_OTHER): Payer: Medicaid Other | Admitting: Obstetrics and Gynecology

## 2017-10-23 ENCOUNTER — Telehealth: Payer: Self-pay | Admitting: Pediatrics

## 2017-10-23 VITALS — BP 135/89 | HR 67 | Wt 162.0 lb

## 2017-10-23 DIAGNOSIS — N926 Irregular menstruation, unspecified: Secondary | ICD-10-CM

## 2017-10-23 DIAGNOSIS — Z9889 Other specified postprocedural states: Secondary | ICD-10-CM | POA: Diagnosis not present

## 2017-10-23 NOTE — Progress Notes (Signed)
   GYNECOLOGY OFFICE VISIT NOTE  History:  21 y.o. [id number] here today for missing her last period. She is s/p lap BTL 06/2017 and has had regular periods since but is about a week late with her period this week. She is otherwise without issue. Having significant stressors, states she screened positive for depression at the pediatricians office. States her husband works a lot and is not home often and having two kids is much more difficult than one.   Past Medical History:  Diagnosis Date  . Anemia affecting pregnancy 03/21/2017  . Asthma    years ago, exercise induced  . Dyspnea    with pregnancy  . Headache    with pregnancy  . Heart murmur    history of, resolved 3 months later  . Hyperemesis 2016  . Hypertension   . Pregnancy induced hypertension   . Syncope    in high school, found out had a heart murmur    Past Surgical History:  Procedure Laterality Date  . LAPAROSCOPIC TUBAL LIGATION Bilateral 07/02/2017   Procedure: LAPAROSCOPIC TUBAL LIGATION - Filshie clips;  Surgeon: Hermina StaggersErvin, Michael L, MD;  Location: [facility];  Service: Gynecology;  Laterality: Bilateral;  . WISDOM TOOTH EXTRACTION      No current outpatient prescriptions on file.  The following portions of the patient's history were reviewed and updated as appropriate: allergies, current medications, past family history, past medical history, past social history, past surgical history and problem list.   Health Maintenance:  Last pap: negative 10/2016 Last mammogram: n/a  Review of Systems:  Pertinent items noted in HPI and remainder of comprehensive ROS otherwise negative.   Objective:  Physical Exam BP 135/89   Pulse 67   Wt 162 lb (73.5 kg)   LMP 09/17/2017   BMI 28.70 kg/m  CONSTITUTIONAL: Well-developed, well-nourished female in no acute distress.  HENT:  Normocephalic, atraumatic. External right and left ear normal. Oropharynx is clear and moist EYES: Conjunctivae and EOM are normal. Pupils are equal,  round, and reactive to light. No scleral icterus.  NECK: Normal range of motion, supple, no masses SKIN: Skin is warm and dry. No rash noted. Not diaphoretic. No erythema. No pallor. NEUROLOGIC: Alert and oriented to person, place, and time. Normal reflexes, muscle tone coordination. No cranial nerve deficit noted. PSYCHIATRIC: Normal mood and affect. Normal behavior. Normal judgment and thought content. CARDIOVASCULAR: Normal heart rate noted RESPIRATORY: Effort normal, no problems with respiration noted ABDOMEN: Soft, no distention noted.   PELVIC: Deferred MUSCULOSKELETAL: Normal range of motion. No edema noted.  Labs and Imaging No results found.  Assessment & Plan:   1. Irregular period Negative UPT in office Reviewed that many things can affect period regularity including hormonal levels, stress. Patient reports significant stress levels, offered to put her in touch with social work, patient accepted and given card for SW. Reviewed that probability of her being pregnant is low s/p BTL however it is not 0 so if she thinks she is pregnant, she should take a pregnancy test. She verbalizes understanding and will return as needed.   Routine preventative health maintenance measures emphasized. Please refer to After Visit Summary for other counseling recommendations.   Return if symptoms worsen or fail to improve.  Baldemar LenisK. Meryl Milanie Rosenfield, M.D. Attending Obstetrician & Gynecologist, [facility], [facility]

## 2017-10-23 NOTE — Progress Notes (Signed)
Pt states that she is having lower right quad pain. Pt states that she had a BTL after last delivery and has had regular cycles since. Pt states she has also been having some chest pain as well.  Pt denies constipation, has good appetite.

## 2017-10-23 NOTE — Telephone Encounter (Signed)
Pt left msg on nurse line c/o missing period and abdominal pain.  I called pt back.  She reports RLQ pain and amenorrhea.  She reports BTL in June w/ normal cycle since. She [facility] and [facility] was [facility].  Pt reports pain worsening and she is concerned she is pregnant d/t lack of cycle.  She requested appt today. I sch appt w/ Dr. Earlene Plateravis at 1500.  Pt voiced understanding and agreed with plan.

## 2017-12-07 ENCOUNTER — Encounter (HOSPITAL_COMMUNITY): Payer: Self-pay | Admitting: *Deleted

## 2017-12-07 ENCOUNTER — Inpatient Hospital Stay (HOSPITAL_COMMUNITY)
Admission: AD | Admit: 2017-12-07 | Discharge: 2017-12-07 | Disposition: A | Discharge status: Home / Self Care | Payer: Medicaid Other | Source: Ambulatory Visit | Attending: Obstetrics and Gynecology | Admitting: Obstetrics and Gynecology

## 2017-12-07 DIAGNOSIS — N898 Other specified noninflammatory disorders of vagina: Secondary | ICD-10-CM | POA: Diagnosis present

## 2017-12-07 DIAGNOSIS — B3731 Acute candidiasis of vulva and vagina: Secondary | ICD-10-CM

## 2017-12-07 DIAGNOSIS — Z3202 Encounter for pregnancy test, result negative: Secondary | ICD-10-CM | POA: Insufficient documentation

## 2017-12-07 DIAGNOSIS — B373 Candidiasis of vulva and vagina: Secondary | ICD-10-CM | POA: Diagnosis not present

## 2017-12-07 LAB — URINALYSIS, ROUTINE W REFLEX MICROSCOPIC
BILIRUBIN URINE: NEGATIVE
Glucose, UA: NEGATIVE mg/dL
HGB URINE DIPSTICK: NEGATIVE
KETONES UR: NEGATIVE mg/dL
NITRITE: NEGATIVE
PH: 6 (ref 5.0–8.0)
Protein, ur: NEGATIVE mg/dL
Specific Gravity, Urine: 1.015 (ref 1.005–1.030)

## 2017-12-07 LAB — WET PREP, GENITAL
Clue Cells Wet Prep HPF POC: NONE SEEN
SPERM: NONE SEEN
TRICH WET PREP: NONE SEEN
Yeast Wet Prep HPF POC: NONE SEEN

## 2017-12-07 LAB — POCT PREGNANCY, URINE: PREG TEST UR: NEGATIVE

## 2017-12-07 MED ORDER — TERCONAZOLE 0.4 % VA CREA: 1.0000 | TOPICAL_CREAM | Freq: Every day | VAGINAL | 0 refills | Status: DC

## 2017-12-07 NOTE — MAU Note (Signed)
Pt reports vaginal irritation x 2 days, vaginal itching.

## 2017-12-07 NOTE — MAU Provider Note (Signed)
History     CSN: [id number]  Arrival date and time: 12/07/17 1032   First Provider Initiated Contact with Patient 12/07/17 1143     Chief Complaint  Patient presents with  . Vaginal Itching   HPI Stacey Reid is a 21 y.o. [id number] non pregnant female who presents with vaginal irritation. She states she switched soaps about a week ago and since then she feels like the outside of her vagina has been irritated. She states she also has a thick white discharge that is irritating. She has pain on the inside of her vagina that she rates a 4/10 and has not tried anything for. She denies any odor to the discharge or any vaginal bleeding.   OB History    Gravida Para Term Preterm AB Living   3 2 2  0 1 2   SAB TAB Ectopic Multiple Live Births   1 0 0 0 2      Past Medical History:  Diagnosis Date  . Anemia affecting pregnancy 03/21/2017  . Asthma    years ago, exercise induced  . Dyspnea    with pregnancy  . Headache    with pregnancy  . Heart murmur    history of, resolved 3 months later  . Hyperemesis 2016  . Hypertension   . Pregnancy induced hypertension   . Syncope    in high school, found out had a heart murmur    Past Surgical History:  Procedure Laterality Date  . LAPAROSCOPIC TUBAL LIGATION Bilateral 07/02/2017   Procedure: LAPAROSCOPIC TUBAL LIGATION - Filshie clips;  Surgeon: Hermina StaggersErvin, Michael L, MD;  Location: [facility];  Service: Gynecology;  Laterality: Bilateral;  . TUBAL LIGATION    . WISDOM TOOTH EXTRACTION      Family History  Problem Relation Age of Onset  . Asthma Mother   . Hypertension Mother   . Miscarriages / [place] Mother     Social History   Tobacco Use  . Smoking status: Never Smoker  . Smokeless tobacco: Never Used  Substance Use Topics  . Alcohol use: Yes    Comment: occ.  . Drug use: No    Allergies: No Known Allergies  Medications Prior to Admission  Medication Sig Dispense Refill Last Dose  . ibuprofen (ADVIL,MOTRIN)  100 MG/5ML suspension Take 600 mg by mouth every 4 (four) hours as needed for moderate pain.   Past Week at Unknown time  . Multiple Vitamins-Minerals (MULTIVITAMIN ADULT) CHEW Chew 1 tablet by mouth daily.   12/06/2017 at Unknown time    Review of Systems  Constitutional: Negative.  Negative for fatigue and fever.  HENT: Negative.   Respiratory: Negative.  Negative for shortness of breath.   Cardiovascular: Negative.  Negative for chest pain.  Gastrointestinal: Negative.  Negative for abdominal pain, constipation, diarrhea, nausea and vomiting.  Genitourinary: Positive for vaginal discharge and vaginal pain. Negative for dysuria.  Neurological: Negative.  Negative for dizziness and headaches.   Physical Exam   Blood pressure 126/80, pulse 68, temperature 98.6 F (37 C), temperature source Oral, resp. rate 15, height 5\' 2"  (1.575 m), weight 156 lb (70.8 kg), last menstrual period 11/19/2017, SpO2 100 %, not currently breastfeeding.  Physical Exam  Nursing note and vitals reviewed. Constitutional: She is oriented to person, place, and time. She appears well-developed and well-nourished. No distress.  HENT:  Head: Normocephalic.  Eyes: Pupils are equal, round, and reactive to light.  Cardiovascular: Normal rate, regular rhythm and normal heart sounds.  Respiratory: Effort  normal and breath sounds normal. No respiratory distress.  GI: Soft. Bowel sounds are normal. She exhibits no distension. There is no tenderness.  Genitourinary: Vaginal discharge (copious thick white adherant discharge) found.  Neurological: She is alert and oriented to person, place, and time.  Skin: Skin is warm and dry.  Psychiatric: She has a normal mood and affect. Her behavior is normal. Judgment and thought content normal.    MAU Course  Procedures Results for orders placed or performed during the hospital encounter of 12/07/17 (from the past 24 hour(s))  Urinalysis, Routine w reflex microscopic     Status:  Abnormal   Collection Time: 12/07/17 11:14 AM  Result Value Ref Range   Color, Urine YELLOW YELLOW   APPearance HAZY (A) CLEAR   Specific Gravity, Urine 1.015 1.005 - 1.030   pH 6.0 5.0 - 8.0   Glucose, UA NEGATIVE NEGATIVE mg/dL   Hgb urine dipstick NEGATIVE NEGATIVE   Bilirubin Urine NEGATIVE NEGATIVE   Ketones, ur NEGATIVE NEGATIVE mg/dL   Protein, ur NEGATIVE NEGATIVE mg/dL   Nitrite NEGATIVE NEGATIVE   Leukocytes, UA LARGE (A) NEGATIVE   RBC / HPF 0-5 0 - 5 RBC/hpf   WBC, UA 6-30 0 - 5 WBC/hpf   Bacteria, UA RARE (A) NONE SEEN   Squamous Epithelial / LPF 6-30 (A) NONE SEEN   Mucus PRESENT   Pregnancy, urine POC     Status: None   Collection Time: 12/07/17 11:22 AM  Result Value Ref Range   Preg Test, Ur NEGATIVE NEGATIVE  Wet prep, genital     Status: Abnormal   Collection Time: 12/07/17 11:46 AM  Result Value Ref Range   Yeast Wet Prep HPF POC NONE SEEN NONE SEEN   Trich, Wet Prep NONE SEEN NONE SEEN   Clue Cells Wet Prep HPF POC NONE SEEN NONE SEEN   WBC, Wet Prep HPF POC MODERATE (A) NONE SEEN   Sperm NONE SEEN     MDM UA, UPT Wet prep and gc/chlamydia  Assessment and Plan   1. Vaginal candidiasis    -Discharge home in stable condition -Rx for terazole sent to patient's pharmacy -Patient advised to follow-up with gyn provider of choice -Patient may return to MAU as needed or if her condition were to change or worsen  Rolm BookbinderCaroline M Zoya Sprecher CNM 12/07/2017, 12:11 PM

## 2017-12-07 NOTE — Discharge Instructions (Signed)
In late 2019, [facility]. At that time, the MAU (Maternity Admissions Unit), where you are being seen today, will no longer take care of non-pregnant patients. We strongly encourage you to find a doctor's office before that time, so that you can be seen with any GYN concerns, like vaginal discharge, urinary tract infection, etc.. in a timely manner.  In order to make an office visit more convenient, the Center for [facility] at [facility] will be offering evening hours with same-day appointments, walk-in appointments and scheduled appointments available during this time.  Center for [facility] @ [facility] Hours: Monday - 8am - 7:30 pm with walk-in between 4pm- 7:30 pm Tuesday - 8 am - 5 pm (starting 03/25/18 we will be open late and accepting walk-ins from 4pm - 7:30pm) Wednesday - 8 am - 5 pm (starting 06/25/18 we will be open late and accepting walk-ins from 4pm - 7:30pm) Thursday 8 am - 5 pm (starting 09/25/18 we will be open late and accepting walk-ins from 4pm - 7:30pm) Friday 8 am - 5 pm  For an appointment please call the Center for [facility] @ [facility] at [phone]  For urgent needs, [facility] is also available for management of urgent GYN complaints such as vaginal discharge or urinary tract infections.       Vaginal Yeast infection, Adult Vaginal yeast infection is a condition that causes soreness, swelling, and redness (inflammation) of the vagina. It also causes vaginal discharge. This is a common condition. Some women get this infection frequently. What are the causes? This condition is caused by a change in the normal balance of the yeast (candida) and bacteria that live in the vagina. This change causes an overgrowth of yeast, which causes the inflammation. What increases the risk? This condition is more likely to develop in:  Women who take antibiotic  medicines.  Women who have diabetes.  Women who take birth control pills.  Women who are pregnant.  Women who douche often.  Women who have a weak defense (immune) system.  Women who have been taking steroid medicines for a long time.  Women who frequently wear tight clothing.  What are the signs or symptoms? Symptoms of this condition include:  White, thick vaginal discharge.  Swelling, itching, redness, and irritation of the vagina. The lips of the vagina (vulva) may be affected as well.  Pain or a burning feeling while urinating.  Pain during sex.  How is this diagnosed? This condition is diagnosed with a medical history and physical exam. This will include a pelvic exam. Your health care provider will examine a sample of your vaginal discharge under a microscope. Your health care provider may send this sample for testing to confirm the diagnosis. How is this treated? This condition is treated with medicine. Medicines may be over-the-counter or prescription. You may be told to use one or more of the following:  Medicine that is taken orally.  Medicine that is applied as a cream.  Medicine that is inserted directly into the vagina (suppository).  Follow these instructions at home:  Take or apply over-the-counter and prescription medicines only as told by your health care provider.  Do not have sex until your health care provider has approved. Tell your sex partner that you have a yeast infection. That person should go to his or her health care provider if he or she develops symptoms.  Do not wear tight  clothes, such as pantyhose or tight pants.  Avoid using tampons until your health care provider approves.  Eat more yogurt. This may help to keep your yeast infection from returning.  Try taking a sitz bath to help with discomfort. This is a warm water bath that is taken while you are sitting down. The water should only come up to your hips and should cover your  buttocks. Do this 3-4 times per day or as told by your health care provider.  Do not douche.  Wear breathable, cotton underwear.  If you have diabetes, keep your blood sugar levels under control. Contact a health care provider if:  You have a fever.  Your symptoms go away and then return.  Your symptoms do not get better with treatment.  Your symptoms get worse.  You have new symptoms.  You develop blisters in or around your vagina.  You have blood coming from your vagina and it is not your menstrual period.  You develop pain in your abdomen. This information is not intended to replace advice given to you by your health care provider. Make sure you discuss any questions you have with your health care provider. Document Released: 09/19/2005 Document Revised: 05/23/2016 Document Reviewed: 06/13/2015 Elsevier Interactive Patient Education  2018 [employer]

## 2017-12-07 NOTE — MAU Note (Signed)
States she switched her shower body was recently- same brand, new scent.

## 2017-12-09 LAB — GC/CHLAMYDIA PROBE AMP (~~LOC~~) NOT AT ARMC
CHLAMYDIA, DNA PROBE: NEGATIVE
NEISSERIA GONORRHEA: NEGATIVE

## 2018-01-01 ENCOUNTER — Other Ambulatory Visit: Payer: Self-pay

## 2018-01-01 ENCOUNTER — Encounter (HOSPITAL_COMMUNITY): Payer: Self-pay | Admitting: *Deleted

## 2018-01-01 ENCOUNTER — Emergency Department (HOSPITAL_COMMUNITY)
Admission: EM | Admit: 2018-01-01 | Discharge: 2018-01-01 | Disposition: A | Discharge status: Home / Self Care | Payer: Medicaid Other | Attending: Emergency Medicine | Admitting: Emergency Medicine

## 2018-01-01 DIAGNOSIS — Z5321 Procedure and treatment not carried out due to patient leaving prior to being seen by health care provider: Secondary | ICD-10-CM | POA: Insufficient documentation

## 2018-01-01 DIAGNOSIS — R05 Cough: Secondary | ICD-10-CM | POA: Diagnosis present

## 2018-01-01 DIAGNOSIS — B379 Candidiasis, unspecified: Secondary | ICD-10-CM | POA: Diagnosis not present

## 2018-01-01 NOTE — ED Triage Notes (Signed)
To ED for eval of cough and possible yeast infection. States she has had a cough for approx 3 wks - no fevers noted. States she feels like she is getting better but her mom wanted her checked for URI as mom was dx recently with same. Pt states she recently changed her body lotion and contributes this to causing possible yeast infection. Denies being sexually active.

## 2018-01-01 NOTE — ED Notes (Signed)
Pt called x2 for vital signs

## 2018-01-01 NOTE — ED Notes (Signed)
Called for re-vital without answer

## 2018-01-01 NOTE — ED Notes (Signed)
Called pt to re-assess vitals, no answer. 

## 2018-01-09 ENCOUNTER — Encounter (HOSPITAL_COMMUNITY): Payer: Self-pay | Admitting: *Deleted

## 2018-01-09 ENCOUNTER — Inpatient Hospital Stay (HOSPITAL_COMMUNITY)
Admission: AD | Admit: 2018-01-09 | Discharge: 2018-01-09 | Disposition: A | Discharge status: Home / Self Care | Payer: Medicaid Other | Source: Ambulatory Visit | Attending: Obstetrics and Gynecology | Admitting: Obstetrics and Gynecology

## 2018-01-09 ENCOUNTER — Other Ambulatory Visit: Payer: Self-pay

## 2018-01-09 DIAGNOSIS — I10 Essential (primary) hypertension: Secondary | ICD-10-CM | POA: Insufficient documentation

## 2018-01-09 DIAGNOSIS — N760 Acute vaginitis: Secondary | ICD-10-CM | POA: Diagnosis not present

## 2018-01-09 DIAGNOSIS — B9689 Other specified bacterial agents as the cause of diseases classified elsewhere: Secondary | ICD-10-CM | POA: Diagnosis not present

## 2018-01-09 DIAGNOSIS — B373 Candidiasis of vulva and vagina: Secondary | ICD-10-CM | POA: Diagnosis not present

## 2018-01-09 DIAGNOSIS — Z79899 Other long term (current) drug therapy: Secondary | ICD-10-CM | POA: Diagnosis not present

## 2018-01-09 DIAGNOSIS — N898 Other specified noninflammatory disorders of vagina: Secondary | ICD-10-CM | POA: Diagnosis present

## 2018-01-09 LAB — URINALYSIS, ROUTINE W REFLEX MICROSCOPIC
Bilirubin Urine: NEGATIVE
Glucose, UA: NEGATIVE mg/dL
Hgb urine dipstick: NEGATIVE
Ketones, ur: NEGATIVE mg/dL
Leukocytes, UA: NEGATIVE
NITRITE: NEGATIVE
PROTEIN: NEGATIVE mg/dL
Specific Gravity, Urine: 1.013 (ref 1.005–1.030)
pH: 5 (ref 5.0–8.0)

## 2018-01-09 LAB — WET PREP, GENITAL
CLUE CELLS WET PREP: NONE SEEN
Sperm: NONE SEEN
Trich, Wet Prep: NONE SEEN
Yeast Wet Prep HPF POC: NONE SEEN

## 2018-01-09 LAB — POCT PREGNANCY, URINE: PREG TEST UR: NEGATIVE

## 2018-01-09 MED ORDER — METRONIDAZOLE 0.75 % VA GEL: 1.0000 | Freq: Every day | VAGINAL | 0 refills | Status: AC

## 2018-01-09 NOTE — MAU Provider Note (Signed)
History     CSN: [id number]  Arrival date and time: 01/09/18 1409   First Provider Initiated Contact with Patient 01/09/18 1758     21 y.o. Non-pregnant female here with vaginal irritation and discharge. Sx started about 2 weeks ago. Discharge is white, clumpy, and malodorous. Not currently sexually active. She was treated for yeast vaginitis about 1 month ago and used 5 days of Monistat-7. She admit to a recent new soap that has fragrance and shaves the genital area.   Past Medical History:  Diagnosis Date  . Anemia affecting pregnancy 03/21/2017  . Asthma    years ago, exercise induced  . Dyspnea    with pregnancy  . Headache    with pregnancy  . Heart murmur    history of, resolved 3 months later  . Hyperemesis 2016  . Hypertension   . Pregnancy induced hypertension   . Syncope    in high school, found out had a heart murmur    Past Surgical History:  Procedure Laterality Date  . LAPAROSCOPIC TUBAL LIGATION Bilateral 07/02/2017   Procedure: LAPAROSCOPIC TUBAL LIGATION - Filshie clips;  Surgeon: Hermina StaggersErvin, Michael L, MD;  Location: [facility];  Service: Gynecology;  Laterality: Bilateral;  . TUBAL LIGATION    . WISDOM TOOTH EXTRACTION      Family History  Problem Relation Age of Onset  . Asthma Mother   . Hypertension Mother   . Miscarriages / [place] Mother     Social History   Tobacco Use  . Smoking status: Never Smoker  . Smokeless tobacco: Never Used  Substance Use Topics  . Alcohol use: Yes    Comment: occ.  . Drug use: No    Allergies: No Known Allergies  Medications Prior to Admission  Medication Sig Dispense Refill Last Dose  . ibuprofen (ADVIL,MOTRIN) 100 MG/5ML suspension Take 600 mg by mouth every 4 (four) hours as needed for moderate pain.   Past Week at Unknown time  . Multiple Vitamins-Minerals (MULTIVITAMIN ADULT) CHEW Chew 1 tablet by mouth daily.   12/06/2017 at Unknown time  . terconazole (TERAZOL 7) 0.4 % vaginal cream Place 1 applicator  vaginally at bedtime. 45 g 0     Review of Systems  Gastrointestinal: Negative.   Genitourinary: Positive for vaginal discharge.   Physical Exam   Blood pressure 116/77, pulse 62, temperature 98.9 F (37.2 C), temperature source Oral, resp. rate 16, weight 69.4 kg (153 lb), last menstrual period 12/15/2017, SpO2 100 %, not currently breastfeeding.  Physical Exam  Nursing note and vitals reviewed. Constitutional: She is oriented to person, place, and time. She appears well-developed and well-nourished.  HENT:  Head: Normocephalic.  Neck: Normal range of motion.  Cardiovascular: Normal rate.  Respiratory: Effort normal. No respiratory distress.  Genitourinary:  Genitourinary Comments: External: no lesions or erythema Vagina: rugated, pink, moist, thick white discharge     Musculoskeletal: Normal range of motion.  Neurological: She is alert and oriented to person, place, and time.  Skin: Skin is warm and dry.  Psychiatric: She has a normal mood and affect.   Results for orders placed or performed during the hospital encounter of 01/09/18 (from the past 24 hour(s))  Urinalysis, Routine w reflex microscopic     Status: Abnormal   Collection Time: 01/09/18  2:53 PM  Result Value Ref Range   Color, Urine YELLOW YELLOW   APPearance HAZY (A) CLEAR   Specific Gravity, Urine 1.013 1.005 - 1.030   pH 5.0 5.0 - 8.0  Glucose, UA NEGATIVE NEGATIVE mg/dL   Hgb urine dipstick NEGATIVE NEGATIVE   Bilirubin Urine NEGATIVE NEGATIVE   Ketones, ur NEGATIVE NEGATIVE mg/dL   Protein, ur NEGATIVE NEGATIVE mg/dL   Nitrite NEGATIVE NEGATIVE   Leukocytes, UA NEGATIVE NEGATIVE  Pregnancy, urine POC     Status: None   Collection Time: 01/09/18  2:59 PM  Result Value Ref Range   Preg Test, Ur NEGATIVE NEGATIVE  Wet prep, genital     Status: Abnormal   Collection Time: 01/09/18  6:11 PM  Result Value Ref Range   Yeast Wet Prep HPF POC NONE SEEN NONE SEEN   Trich, Wet Prep NONE SEEN NONE SEEN    Clue Cells Wet Prep HPF POC NONE SEEN NONE SEEN   WBC, Wet Prep HPF POC FEW (A) NONE SEEN   Sperm NONE SEEN    MAU Course  Procedures  MDM Labs ordered and reviewed. No yeast or clue cells seen on wet prep.Will treat for BV based on sx. Tub soaks with baking soda. Stable for discharge home.  Assessment and Plan   1. Bacterial vaginosis    Discharge home Rx Metrogel (cannot take pills) Tubs soaks Stop using soap in genitals, warm water only Follow up at [facility] as needed  Allergies as of 01/09/2018   No Known Allergies     Medication List    STOP taking these medications   terconazole 0.4 % vaginal cream Commonly known as:  TERAZOL 7     TAKE these medications   ibuprofen 100 MG/5ML suspension Commonly known as:  ADVIL,MOTRIN Take 600 mg by mouth every 4 (four) hours as needed for moderate pain.   metroNIDAZOLE 0.75 % vaginal gel Commonly known as:  METROGEL Place 1 Applicatorful vaginally at bedtime for 5 days.   MULTIVITAMIN ADULT Chew Chew 1 tablet by mouth daily.      Stacey Reid, CNM 01/09/2018, 7:04 PM

## 2018-01-09 NOTE — Discharge Instructions (Signed)
Bacterial Vaginosis °Bacterial vaginosis is an infection of the vagina. It happens when too many germs (bacteria) grow in the vagina. This infection puts you at risk for infections from sex (STIs). Treating this infection can lower your risk for some STIs. You should also treat this if you are pregnant. It can cause your baby to be born early. °Follow these instructions at home: °Medicines °· Take over-the-counter and prescription medicines only as told by your doctor. °· Take or use your antibiotic medicine as told by your doctor. Do not stop taking or using it even if you start to feel better. °General instructions °· If you your sexual partner is a woman, tell her that you have this infection. She needs to get treatment if she has symptoms. If you have a female partner, he does not need to be treated. °· During treatment: °? Avoid sex. °? Do not douche. °? Avoid alcohol as told. °? Avoid breastfeeding as told. °· Drink enough fluid to keep your pee (urine) clear or pale yellow. °· Keep your vagina and butt (rectum) clean. °? Wash the area with warm water every day. °? Wipe from front to back after you use the toilet. °· Keep all follow-up visits as told by your doctor. This is important. °Preventing this condition °· Do not douche. °· Use only warm water to wash around your vagina. °· Use protection when you have sex. This includes: °? Latex condoms. °? Dental dams. °· Limit how many people you have sex with. It is best to only have sex with the same person (be monogamous). °· Get tested for STIs. Have your partner get tested. °· Wear underwear that is cotton or lined with cotton. °· Avoid tight pants and pantyhose. This is most important in summer. °· Do not use any products that have nicotine or tobacco in them. These include cigarettes and e-cigarettes. If you need help quitting, ask your doctor. °· Do not use illegal drugs. °· Limit how much alcohol you drink. °Contact a doctor if: °· Your symptoms do not get  better, even after you are treated. °· You have more discharge or pain when you pee (urinate). °· You have a fever. °· You have pain in your belly (abdomen). °· You have pain with sex. °· Your bleed from your vagina between periods. °Summary °· This infection happens when too many germs (bacteria) grow in the vagina. °· Treating this condition can lower your risk for some infections from sex (STIs). °· You should also treat this if you are pregnant. It can cause early (premature) birth. °· Do not stop taking or using your antibiotic medicine even if you start to feel better. °This information is not intended to replace advice given to you by your health care provider. Make sure you discuss any questions you have with your health care provider. °Document Released: 09/18/2008 Document Revised: 08/25/2016 Document Reviewed: 08/25/2016 °Elsevier Interactive Patient Education © 2017 Elsevier Inc. ° °In late 2019, the [facility] will be moving to the [place] campus. At that time, the MAU (Maternity Admissions Unit), where you are being seen today, will no longer take care of non-pregnant patients. We strongly encourage you to find a doctor's office before that time, so that you can be seen with any GYN concerns, like vaginal discharge, urinary tract infection, etc.. in a timely manner. ° °In order to make an office visit more convenient, the [facility] will be offering evening hours with same-day   appointments, walk-in appointments and scheduled appointments available during this time. ° °[facility] Hours: °Monday - 8am - 7:30 pm with walk-in between 4pm- 7:30 pm °Tuesday - 8 am - 5 pm (starting 03/25/18 we will be open late and accepting walk-ins from 4pm - 7:30pm) °Wednesday - 8 am - 5 pm (starting 06/25/18 we will be open late and accepting walk-ins from 4pm - 7:30pm) °Thursday 8 am - 5 pm (starting 09/25/18 we will be open late and  accepting walk-ins from 4pm - 7:30pm) °Friday 8 am - 5 pm ° °For an appointment please call the [facility] at [phone] ° °For urgent needs, [facility] is also available for management of urgent GYN complaints such as vaginal discharge or urinary tract infections. ° ° ° ° ° °

## 2018-01-09 NOTE — MAU Note (Signed)
Was here like 3 wks ago, was told she had a yeast infection. Had changed body wash, changed back to old type, still having d/c and cramping in vagina.

## 2018-01-10 LAB — GC/CHLAMYDIA PROBE AMP (~~LOC~~) NOT AT ARMC
Chlamydia: NEGATIVE
NEISSERIA GONORRHEA: NEGATIVE

## 2018-01-21 IMAGING — US US OB COMP LESS 14 WK
1 series · 15 of 28 positions shown · non-contrast
Comparison: None

CLINICAL DATA: Followup viability

EXAM:
OBSTETRIC <14 WK US AND TRANSVAGINAL OB US
TECHNIQUE: Both transabdominal and transvaginal ultrasound examinations were
performed for complete evaluation of the gestation as well as the
maternal uterus, adnexal regions, and pelvic cul-de-sac.
Transvaginal technique was performed to assess early pregnancy.

[Series 1: us ob comp less 14 wk · 15 of 45 slices shown]
[im 1/45]
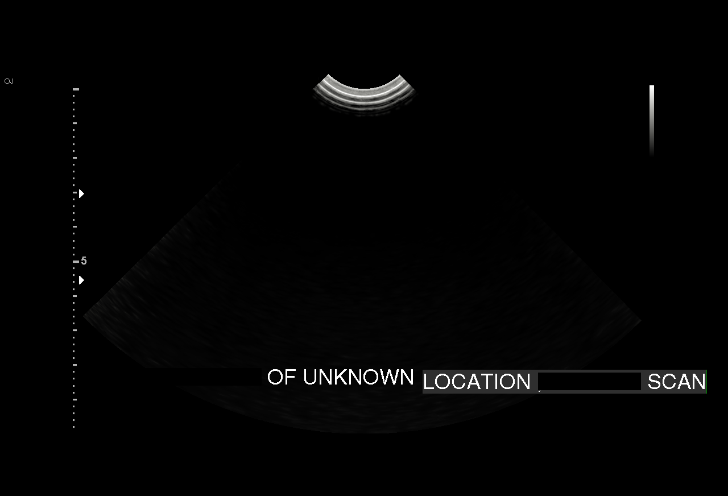
[im 4/45]
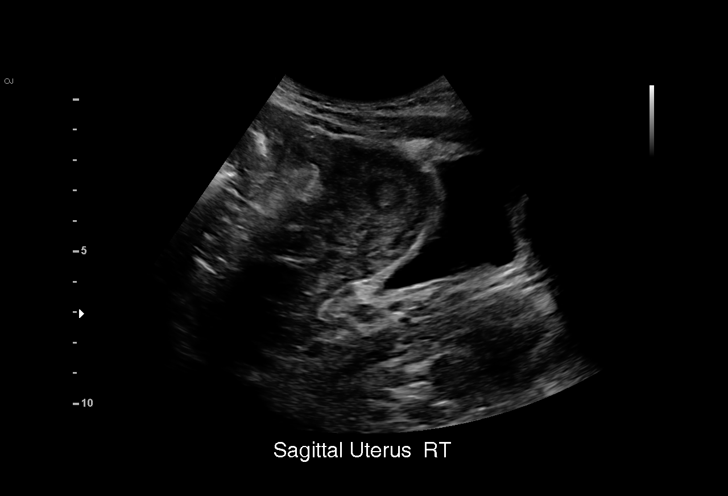
[im 7/45]
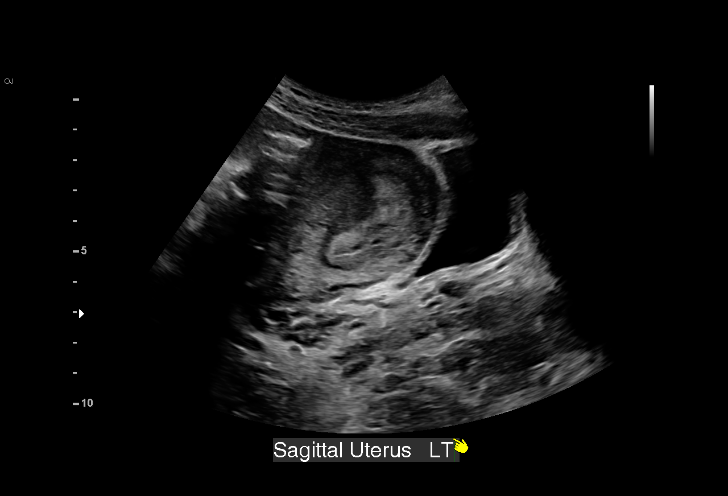
[im 10/45]
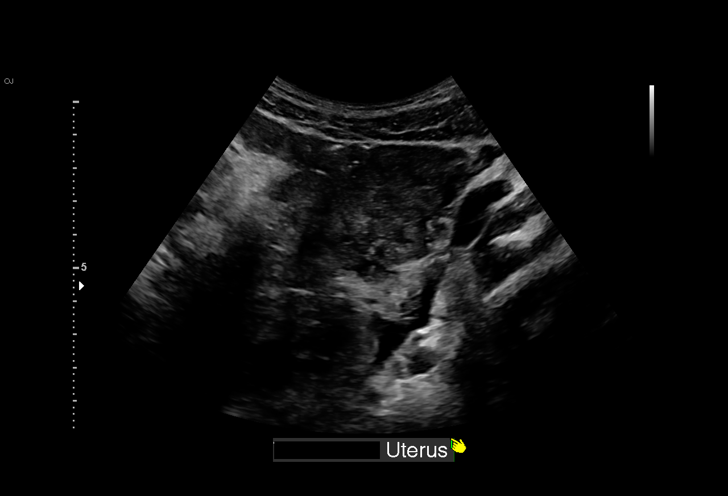
[im 14/45]
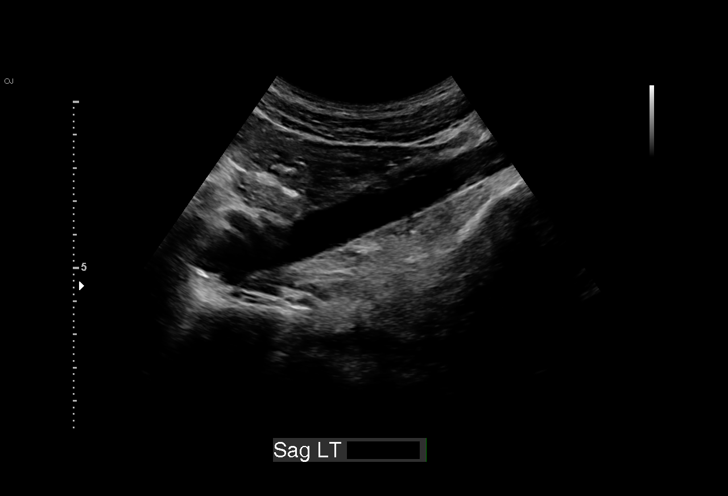
[im 17/45]
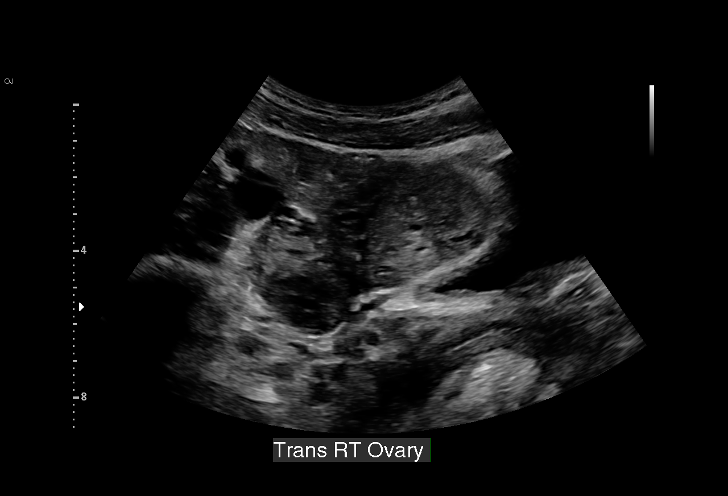
[im 20/45]
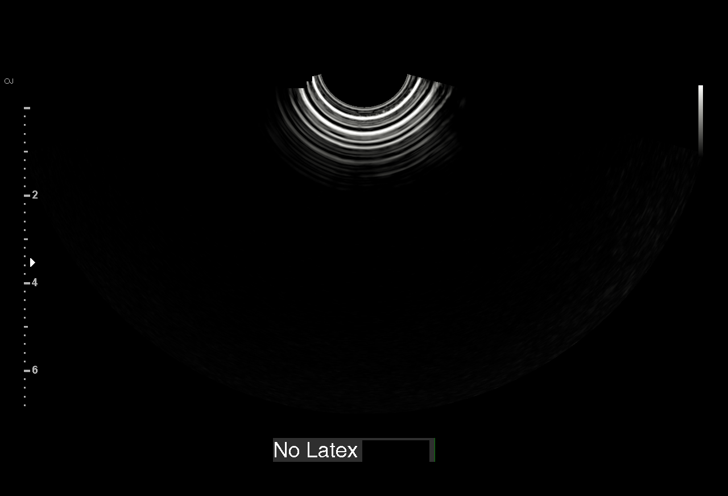
[im 23/45]
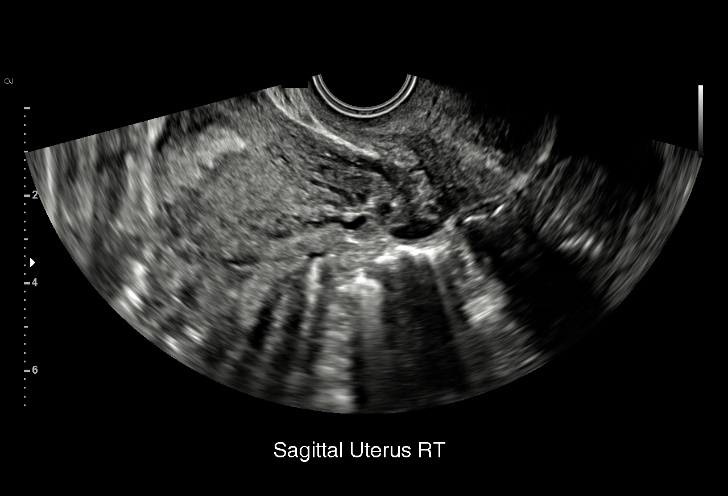
[im 25/45]
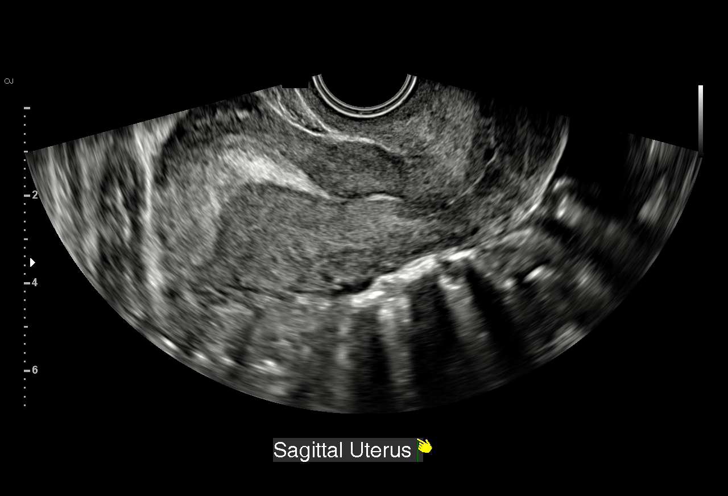
[im 28/45]
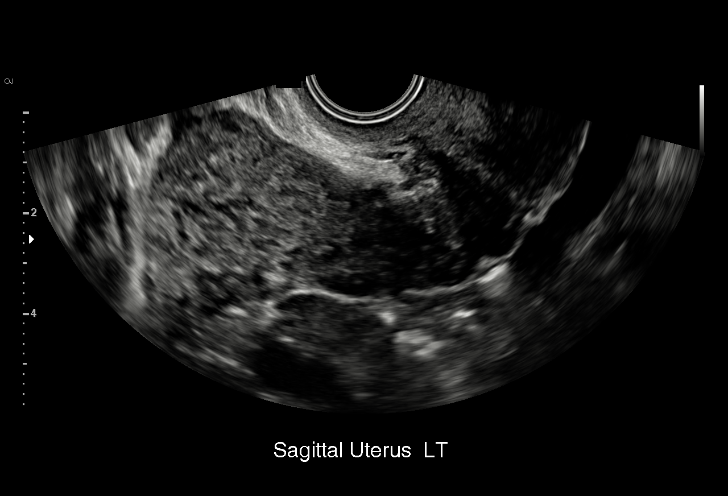
[im 31/45]
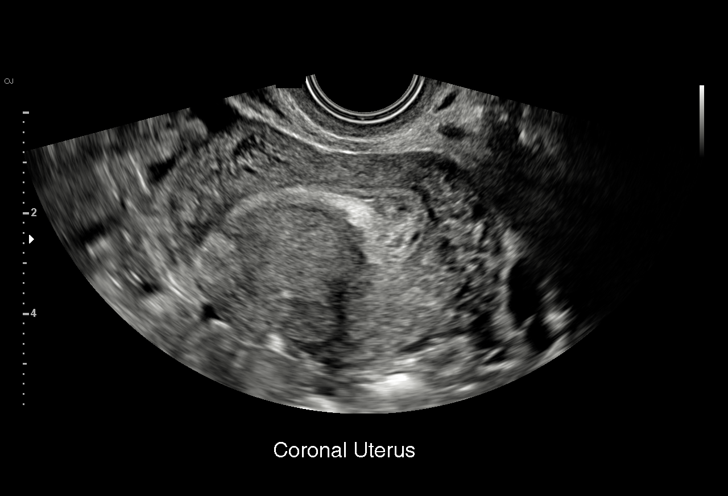
[im 35/45]
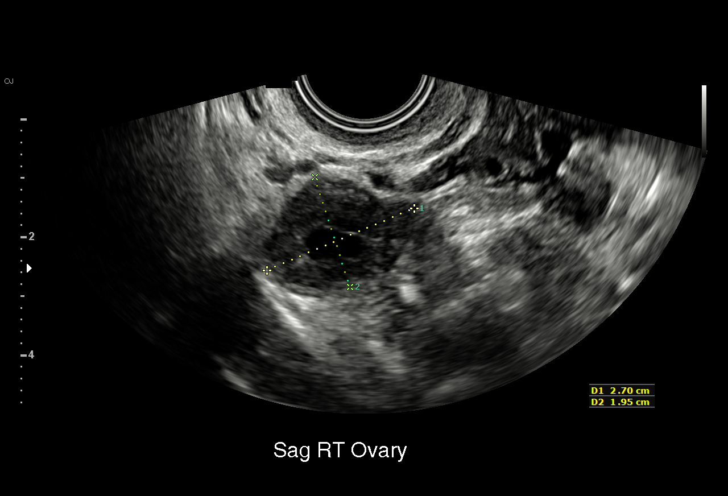
[im 38/45]
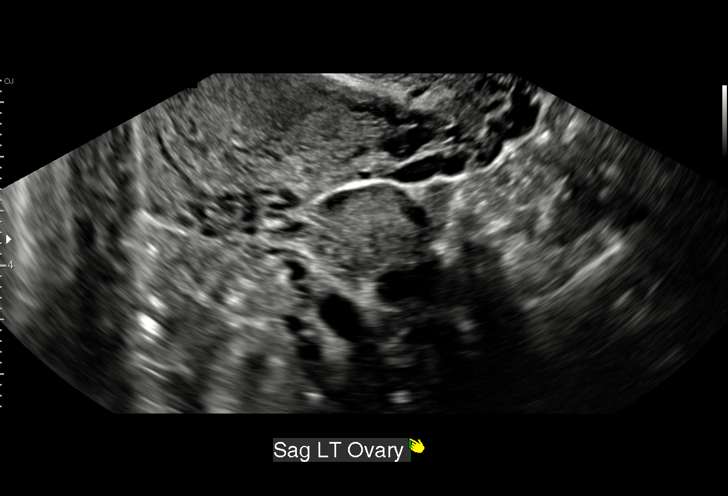
[im 41/45]
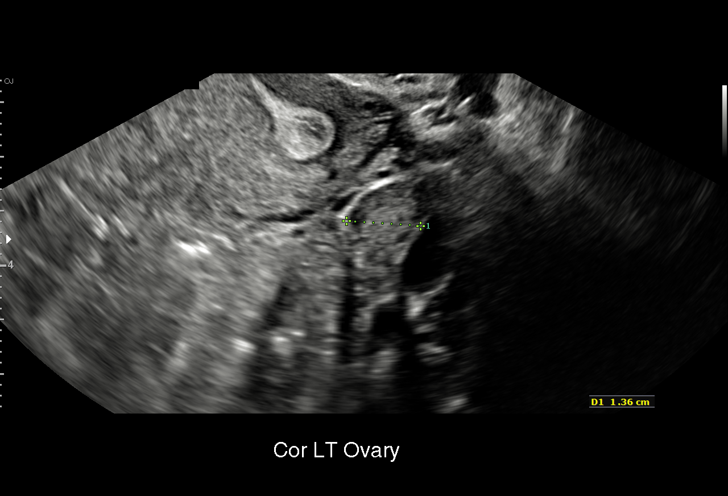
[im 45/45]
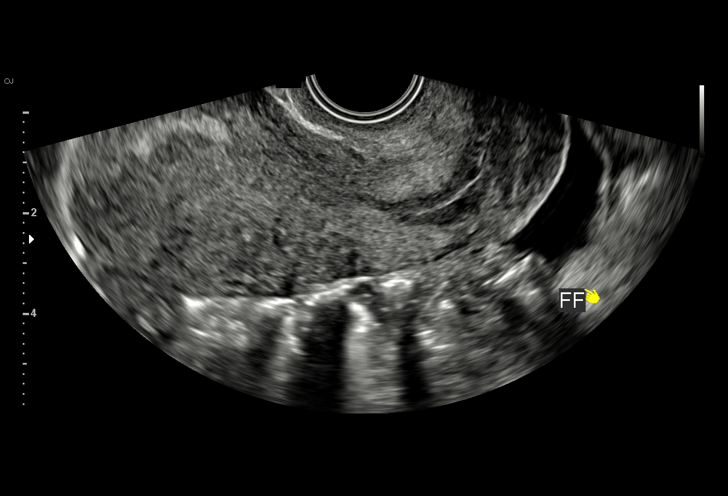

[15 of 28 positions shown; findings below may reference images not displayed]

FINDINGS: Intrauterine gestational sac: Single

Yolk sac:  Not Visualized.

Embryo:  Not Visualized.

Cardiac Activity: Not Visualized.

MSD: 5.4  mm   5 w   2  d

Subchorionic hemorrhage:  None visualized.

Maternal uterus/adnexae:

Right ovary: Normal

Left ovary: Normal

Other :None

Free fluid:  Trace free fluid.
IMPRESSION: 1. Probable early intrauterine gestational sac, but no yolk sac,
fetal pole, or cardiac activity yet visualized. Recommend follow-up
quantitative B-HCG levels and follow-up US in 14 days to confirm and
assess viability. This recommendation follows SRU consensus
guidelines: Diagnostic Criteria for Nonviable Pregnancy Early in the
First Trimester. N Engl J Med 2339; [DATE].

## 2018-04-15 ENCOUNTER — Encounter (HOSPITAL_COMMUNITY): Payer: Self-pay | Admitting: *Deleted

## 2018-04-15 ENCOUNTER — Other Ambulatory Visit: Payer: Self-pay

## 2018-04-15 ENCOUNTER — Inpatient Hospital Stay (HOSPITAL_COMMUNITY)
Admission: AD | Admit: 2018-04-15 | Discharge: 2018-04-15 | Disposition: A | Discharge status: Home / Self Care | Payer: Medicaid Other | Source: Ambulatory Visit | Attending: Obstetrics & Gynecology | Admitting: Obstetrics & Gynecology

## 2018-04-15 DIAGNOSIS — B373 Candidiasis of vulva and vagina: Secondary | ICD-10-CM | POA: Diagnosis not present

## 2018-04-15 DIAGNOSIS — B3731 Acute candidiasis of vulva and vagina: Secondary | ICD-10-CM

## 2018-04-15 DIAGNOSIS — Z79899 Other long term (current) drug therapy: Secondary | ICD-10-CM | POA: Diagnosis not present

## 2018-04-15 DIAGNOSIS — B9689 Other specified bacterial agents as the cause of diseases classified elsewhere: Secondary | ICD-10-CM | POA: Diagnosis not present

## 2018-04-15 DIAGNOSIS — N760 Acute vaginitis: Secondary | ICD-10-CM

## 2018-04-15 DIAGNOSIS — R1030 Lower abdominal pain, unspecified: Secondary | ICD-10-CM | POA: Diagnosis present

## 2018-04-15 LAB — URINALYSIS, ROUTINE W REFLEX MICROSCOPIC
BILIRUBIN URINE: NEGATIVE
GLUCOSE, UA: NEGATIVE mg/dL
HGB URINE DIPSTICK: NEGATIVE
Ketones, ur: NEGATIVE mg/dL
Leukocytes, UA: NEGATIVE
NITRITE: NEGATIVE
PH: 5 (ref 5.0–8.0)
Protein, ur: NEGATIVE mg/dL
Specific Gravity, Urine: 1.016 (ref 1.005–1.030)

## 2018-04-15 LAB — CBC
HEMATOCRIT: 39 % (ref 36.0–46.0)
Hemoglobin: 13.3 g/dL (ref 12.0–15.0)
MCH: 29.7 pg (ref 26.0–34.0)
MCHC: 34.1 g/dL (ref 30.0–36.0)
MCV: 87.1 fL (ref 78.0–100.0)
Platelets: 427 10*3/uL — ABNORMAL HIGH (ref 150–400)
RBC: 4.48 MIL/uL (ref 3.87–5.11)
RDW: 13.3 % (ref 11.5–15.5)
WBC: 3.9 10*3/uL — ABNORMAL LOW (ref 4.0–10.5)

## 2018-04-15 LAB — POCT PREGNANCY, URINE: Preg Test, Ur: NEGATIVE

## 2018-04-15 LAB — WET PREP, GENITAL
Sperm: NONE SEEN
Trich, Wet Prep: NONE SEEN
Yeast Wet Prep HPF POC: NONE SEEN

## 2018-04-15 MED ORDER — TERCONAZOLE 0.8 % VA CREA: 1.0000 | TOPICAL_CREAM | Freq: Every day | VAGINAL | 0 refills | Status: DC

## 2018-04-15 MED ORDER — FLUCONAZOLE 150 MG PO TABS: 150.0000 mg | ORAL_TABLET | Freq: Every day | ORAL | 1 refills | Status: DC

## 2018-04-15 MED ORDER — METRONIDAZOLE 500 MG PO TABS: 500.0000 mg | ORAL_TABLET | Freq: Two times a day (BID) | ORAL | 0 refills | Status: DC

## 2018-04-15 MED ORDER — METRONIDAZOLE 0.75 % VA GEL: 1.0000 | Freq: Two times a day (BID) | VAGINAL | 0 refills | Status: DC

## 2018-04-15 NOTE — MAU Provider Note (Signed)
History     CSN: [id number]  Arrival date and time: 04/15/18 [id number]   First Provider Initiated Contact with Patient 04/15/18 1006      Chief Complaint  Patient presents with  . Abdominal Pain  . Vaginal Discharge  . Pelvic Pain   HPI Stacey Reid is a 22 y.o. [id number] non pregnant female who presents with abdominal pain and vaginal discharge. Symptoms began 4 days ago. Describes thin white discharge with "clumps" in it. Discharge does have a foul odor. Endorses vaginal itching and irritation. Used a 1 day OTC yeast treatment several days ago without relief. Lower abdominal cramping started with discharge. Cramping is intermittent. Rates pain 3/10. Has not treated symptoms. Nothing makes better or worse. She is in monogamous relationship with partner x 3 years. Does not use condoms. Contraception is BTL, Denies fever/chills, n/v/d, constipation, dysuria, vaginal bleeding, dyspareunia, or post coital bleeding.   Past Medical History:  Diagnosis Date  . Anemia affecting pregnancy 03/21/2017  . Asthma    years ago, exercise induced  . Headache    with pregnancy  . Heart murmur    history of, resolved 3 months later  . Hyperemesis 2016  . Hypertension   . Pregnancy induced hypertension   . Syncope    in high school, found out had a heart murmur    Past Surgical History:  Procedure Laterality Date  . LAPAROSCOPIC TUBAL LIGATION Bilateral 07/02/2017   Procedure: LAPAROSCOPIC TUBAL LIGATION - Filshie clips;  Surgeon: Hermina StaggersErvin, Michael L, MD;  Location: [facility];  Service: Gynecology;  Laterality: Bilateral;  . TUBAL LIGATION    . WISDOM TOOTH EXTRACTION      Family History  Problem Relation Age of Onset  . Asthma Mother   . Hypertension Mother   . Miscarriages / [place] Mother     Social History   Tobacco Use  . Smoking status: Never Smoker  . Smokeless tobacco: Never Used  Substance Use Topics  . Alcohol use: Yes    Comment: occ.  . Drug use: No    Allergies:  No Known Allergies  Medications Prior to Admission  Medication Sig Dispense Refill Last Dose  . ibuprofen (ADVIL,MOTRIN) 100 MG/5ML suspension Take 600 mg by mouth every 4 (four) hours as needed for moderate pain.   Past Week at Unknown time  . Multiple Vitamins-Minerals (MULTIVITAMIN ADULT) CHEW Chew 1 tablet by mouth daily.   12/06/2017 at Unknown time    Review of Systems  Constitutional: Negative.   Gastrointestinal: Positive for abdominal pain. Negative for constipation, diarrhea, nausea and vomiting.  Genitourinary: Positive for urgency and vaginal discharge. Negative for dyspareunia, dysuria, frequency, hematuria, vaginal bleeding and vaginal pain.   Physical Exam   Blood pressure 115/65, pulse 63, temperature 98.3 F (36.8 C), temperature source Oral, resp. rate 16, weight 141 lb 4 oz (64.1 kg), last menstrual period 04/01/2018, SpO2 100 %, not currently breastfeeding.  Physical Exam  Nursing note and vitals reviewed. Constitutional: She is oriented to person, place, and time. She appears well-developed and well-nourished. No distress.  HENT:  Head: Normocephalic and atraumatic.  Eyes: Conjunctivae are normal. Right eye exhibits no discharge. Left eye exhibits no discharge. No scleral icterus.  Neck: Normal range of motion.  Respiratory: Effort normal. No respiratory distress.  GI: Soft. Bowel sounds are normal. She exhibits no distension. There is no tenderness. There is no rebound and no guarding.  Genitourinary: Uterus normal. Cervix exhibits no motion tenderness and no friability. Right adnexum displays  no mass and no tenderness. Left adnexum displays no mass and no tenderness. No bleeding in the vagina. Vaginal discharge found.  Genitourinary Comments: Small amount of thin, malodorous, tan discharge. Patches of white on vaginal walls.   Neurological: She is alert and oriented to person, place, and time.  Skin: Skin is warm and dry. She is not diaphoretic.  Psychiatric: She  has a normal mood and affect. Her behavior is normal. Judgment and thought content normal.    MAU Course  Procedures Results for orders placed or performed during the hospital encounter of 04/15/18 (from the past 24 hour(s))  Urinalysis, Routine w reflex microscopic     Status: Abnormal   Collection Time: 04/15/18  9:39 AM  Result Value Ref Range   Color, Urine YELLOW YELLOW   APPearance CLOUDY (A) CLEAR   Specific Gravity, Urine 1.016 1.005 - 1.030   pH 5.0 5.0 - 8.0   Glucose, UA NEGATIVE NEGATIVE mg/dL   Hgb urine dipstick NEGATIVE NEGATIVE   Bilirubin Urine NEGATIVE NEGATIVE   Ketones, ur NEGATIVE NEGATIVE mg/dL   Protein, ur NEGATIVE NEGATIVE mg/dL   Nitrite NEGATIVE NEGATIVE   Leukocytes, UA NEGATIVE NEGATIVE  Pregnancy, urine POC     Status: None   Collection Time: 04/15/18  9:53 AM  Result Value Ref Range   Preg Test, Ur NEGATIVE NEGATIVE  Wet prep, genital     Status: Abnormal   Collection Time: 04/15/18 10:15 AM  Result Value Ref Range   Yeast Wet Prep HPF POC NONE SEEN NONE SEEN   Trich, Wet Prep NONE SEEN NONE SEEN   Clue Cells Wet Prep HPF POC PRESENT (A) NONE SEEN   WBC, Wet Prep HPF POC FEW (A) NONE SEEN   Sperm NONE SEEN   CBC     Status: Abnormal   Collection Time: 04/15/18 10:30 AM  Result Value Ref Range   WBC 3.9 (L) 4.0 - 10.5 K/uL   RBC 4.48 3.87 - 5.11 MIL/uL   Hemoglobin 13.3 12.0 - 15.0 g/dL   HCT 84.1 32.4 - 40.1 %   MCV 87.1 78.0 - 100.0 fL   MCH 29.7 26.0 - 34.0 pg   MCHC 34.1 30.0 - 36.0 g/dL   RDW 02.7 25.3 - 66.4 %   Platelets 427 (H) 150 - 400 K/uL    MDM UPT negative. U/a negative, no evidence of UTI.  Pt would like STI testing. CBC, HIV, RPR, GC/CT, & wet prep ordered.   Assessment and Plan  A: 1. BV (bacterial vaginosis)   2. Vaginal yeast infection    P: Discharge home Rx terazol & metrogel (pt can't take pills) GC/CT, HIV, RPR pending F/u with PCP as needed  Judeth Horn 04/15/2018, 10:06 AM

## 2018-04-15 NOTE — Discharge Instructions (Signed)
Bacterial Vaginosis Bacterial vaginosis is a vaginal infection that occurs when the normal balance of bacteria in the vagina is disrupted. It results from an overgrowth of certain bacteria. This is the most common vaginal infection among women ages 15-44. Because bacterial vaginosis increases your risk for STIs (sexually transmitted infections), getting treated can help reduce your risk for chlamydia, gonorrhea, herpes, and HIV (human immunodeficiency virus). Treatment is also important for preventing complications in pregnant women, because this condition can cause an early (premature) delivery. What are the causes? This condition is caused by an increase in harmful bacteria that are normally present in small amounts in the vagina. However, the reason that the condition develops is not fully understood. What increases the risk? The following factors may make you more likely to develop this condition:  Having a new sexual partner or multiple sexual partners.  Having unprotected sex.  Douching.  Having an intrauterine device (IUD).  Smoking.  Drug and alcohol abuse.  Taking certain antibiotic medicines.  Being pregnant.  You cannot get bacterial vaginosis from toilet seats, bedding, swimming pools, or contact with objects around you. What are the signs or symptoms? Symptoms of this condition include:  Grey or white vaginal discharge. The discharge can also be watery or foamy.  A fish-like odor with discharge, especially after sexual intercourse or during menstruation.  Itching in and around the vagina.  Burning or pain with urination.  Some women with bacterial vaginosis have no signs or symptoms. How is this diagnosed? This condition is diagnosed based on:  Your medical history.  A physical exam of the vagina.  Testing a sample of vaginal fluid under a microscope to look for a large amount of bad bacteria or abnormal cells. Your health care provider may use a cotton swab  or a small wooden spatula to collect the sample.  How is this treated? This condition is treated with antibiotics. These may be given as a pill, a vaginal cream, or a medicine that is put into the vagina (suppository). If the condition comes back after treatment, a second round of antibiotics may be needed. Follow these instructions at home: Medicines  Take over-the-counter and prescription medicines only as told by your health care provider.  Take or use your antibiotic as told by your health care provider. Do not stop taking or using the antibiotic even if you start to feel better. General instructions  If you have a female sexual partner, tell her that you have a vaginal infection. She should see her health care provider and be treated if she has symptoms. If you have a female sexual partner, he does not need treatment.  During treatment: ? Avoid sexual activity until you finish treatment. ? Do not douche. ? Avoid alcohol as directed by your health care provider. ? Avoid breastfeeding as directed by your health care provider.  Drink enough water and fluids to keep your urine clear or pale yellow.  Keep the area around your vagina and rectum clean. ? Wash the area daily with warm water. ? Wipe yourself from front to back after using the toilet.  Keep all follow-up visits as told by your health care provider. This is important. How is this prevented?  Do not douche.  Wash the outside of your vagina with warm water only.  Use protection when having sex. This includes latex condoms and dental dams.  Limit how many sexual partners you have. To help prevent bacterial vaginosis, it is best to have sex with just   one partner (monogamous).  Make sure you and your sexual partner are tested for STIs.  Wear cotton or cotton-lined underwear.  Avoid wearing tight pants and pantyhose, especially during summer.  Limit the amount of alcohol that you drink.  Do not use any products that  contain nicotine or tobacco, such as cigarettes and e-cigarettes. If you need help quitting, ask your health care provider.  Do not use illegal drugs. Where to find more information:  Centers for Disease Control and Prevention: www.cdc.gov/std  American Sexual Health Association (ASHA): www.ashastd.org  U.S[place] Department of Health and Human Services, Office on Women's Health: www.womenshealth.gov/ or https://www.womenshealth.gov/a-z-topics/bacterial-vaginosis Contact a health care provider if:  Your symptoms do not improve, even after treatment.  You have more discharge or pain when urinating.  You have a fever.  You have pain in your abdomen.  You have pain during sex.  You have vaginal bleeding between periods. Summary  Bacterial vaginosis is a vaginal infection that occurs when the normal balance of bacteria in the vagina is disrupted.  Because bacterial vaginosis increases your risk for STIs (sexually transmitted infections), getting treated can help reduce your risk for chlamydia, gonorrhea, herpes, and HIV (human immunodeficiency virus). Treatment is also important for preventing complications in pregnant women, because the condition can cause an early (premature) delivery.  This condition is treated with antibiotic medicines. These may be given as a pill, a vaginal cream, or a medicine that is put into the vagina (suppository). This information is not intended to replace advice given to you by your health care provider. Make sure you discuss any questions you have with your health care provider. Document Released: 12/10/2005 Document Revised: 04/15/2017 Document Reviewed: 08/25/2016 Elsevier Interactive Patient Education  2018 Elsevier Inc. Vaginal Yeast infection, Adult Vaginal yeast infection is a condition that causes soreness, swelling, and redness (inflammation) of the vagina. It also causes vaginal discharge. This is a common condition. Some women get this infection  frequently. What are the causes? This condition is caused by a change in the normal balance of the yeast (candida) and bacteria that live in the vagina. This change causes an overgrowth of yeast, which causes the inflammation. What increases the risk? This condition is more likely to develop in:  Women who take antibiotic medicines.  Women who have diabetes.  Women who take birth control pills.  Women who are pregnant.  Women who douche often.  Women who have a weak defense (immune) system.  Women who have been taking steroid medicines for a long time.  Women who frequently wear tight clothing.  What are the signs or symptoms? Symptoms of this condition include:  White, thick vaginal discharge.  Swelling, itching, redness, and irritation of the vagina. The lips of the vagina (vulva) may be affected as well.  Pain or a burning feeling while urinating.  Pain during sex.  How is this diagnosed? This condition is diagnosed with a medical history and physical exam. This will include a pelvic exam. Your health care provider will examine a sample of your vaginal discharge under a microscope. Your health care provider may send this sample for testing to confirm the diagnosis. How is this treated? This condition is treated with medicine. Medicines may be over-the-counter or prescription. You may be told to use one or more of the following:  Medicine that is taken orally.  Medicine that is applied as a cream.  Medicine that is inserted directly into the vagina (suppository).  Follow these instructions at home:    Take or apply over-the-counter and prescription medicines only as told by your health care provider.  Do not have sex until your health care provider has approved. Tell your sex partner that you have a yeast infection. That person should go to his or her health care provider if he or she develops symptoms.  Do not wear tight clothes, such as pantyhose or tight  pants.  Avoid using tampons until your health care provider approves.  Eat more yogurt. This may help to keep your yeast infection from returning.  Try taking a sitz bath to help with discomfort. This is a warm water bath that is taken while you are sitting down. The water should only come up to your hips and should cover your buttocks. Do this 3-4 times per day or as told by your health care provider.  Do not douche.  Wear breathable, cotton underwear.  If you have diabetes, keep your blood sugar levels under control. Contact a health care provider if:  You have a fever.  Your symptoms go away and then return.  Your symptoms do not get better with treatment.  Your symptoms get worse.  You have new symptoms.  You develop blisters in or around your vagina.  You have blood coming from your vagina and it is not your menstrual period.  You develop pain in your abdomen. This information is not intended to replace advice given to you by your health care provider. Make sure you discuss any questions you have with your health care provider. Document Released: 09/19/2005 Document Revised: 05/23/2016 Document Reviewed: 06/13/2015 Elsevier Interactive Patient Education  2018 Elsevier Inc.  

## 2018-04-15 NOTE — MAU Note (Signed)
Cramping in lower abd/pelvis, started last wk.  Period just ended, always seems to "catch a yeast infection or something after".  Took a 1 day over the counter yeast treatment but didn't really help.

## 2018-04-16 LAB — GC/CHLAMYDIA PROBE AMP (~~LOC~~) NOT AT ARMC
Chlamydia: NEGATIVE
Neisseria Gonorrhea: NEGATIVE

## 2018-04-16 LAB — HIV ANTIBODY (ROUTINE TESTING W REFLEX): HIV SCREEN 4TH GENERATION: NONREACTIVE

## 2018-04-16 LAB — RPR: RPR: NONREACTIVE

## 2018-05-08 ENCOUNTER — Other Ambulatory Visit: Payer: Self-pay

## 2018-05-08 ENCOUNTER — Ambulatory Visit (HOSPITAL_COMMUNITY)
Admission: EM | Admit: 2018-05-08 | Discharge: 2018-05-08 | Disposition: A | Discharge status: Home / Self Care | Payer: Medicaid Other | Attending: Family Medicine | Admitting: Family Medicine

## 2018-05-08 ENCOUNTER — Encounter (HOSPITAL_COMMUNITY): Payer: Self-pay | Admitting: Emergency Medicine

## 2018-05-08 DIAGNOSIS — H6121 Impacted cerumen, right ear: Secondary | ICD-10-CM

## 2018-05-08 NOTE — ED Provider Notes (Signed)
[facility]   [id number] 05/08/18 Arrival Time: 1001  ASSESSMENT & PLAN:  1. Impacted cerumen of right ear    Improved after flushing ear. No signs of infection. May f/u with PCP or here as needed.  Reviewed expectations re: course of current medical issues. Questions answered. Outlined signs and symptoms indicating need for more acute intervention. Patient verbalized understanding. After Visit Summary given.   SUBJECTIVE: History from: patient.  Stacey Reid is a 21 y.o. female who presents with complaint of right ear discomfort without drainage. "Ear feels full." Onset gradual, approximately 2 days ago. "Sounds are muffled." Recent cold symptoms: none. Fever: no. Overall normal PO intake without n/v. Sick contacts: no. OTC treatment: none. No h/o similar.  Social History   Tobacco Use  Smoking Status Never Smoker  Smokeless Tobacco Never Used    ROS: As per HPI.   OBJECTIVE:  Vitals:   05/08/18 1023  BP: 115/76  Pulse: 82  Resp: 16  Temp: (!) 97.3 F (36.3 C)  TempSrc: Oral  SpO2: 98%     General appearance: alert; appears fatigued Ear Canal: cerumen on the right TM: bilateral: normal Neck: supple without LAD Lungs: unlabored respirations, symmetrical air entry; cough: absent; no respiratory distress Skin: warm and dry Psychological: alert and cooperative; normal mood and affect  No Known Allergies  Past Medical History:  Diagnosis Date  . Anemia affecting pregnancy 03/21/2017  . Asthma    years ago, exercise induced  . Headache    with pregnancy  . Heart murmur    history of, resolved 3 months later  . Hyperemesis 2016  . Hypertension   . Pregnancy induced hypertension   . Syncope    in high school, found out had a heart murmur   Family History  Problem Relation Age of Onset  . Asthma Mother   . Hypertension Mother   . Miscarriages / [place] Mother    Social History   Socioeconomic History  . Marital status:  Single    Spouse name: Not on file  . Number of children: Not on file  . Years of education: Not on file  . Highest education level: Not on file  Occupational History  . Not on file  Social Needs  . Financial resource strain: Not on file  . Food insecurity:    Worry: Not on file    Inability: Not on file  . Transportation needs:    Medical: Not on file    Non-medical: Not on file  Tobacco Use  . Smoking status: Never Smoker  . Smokeless tobacco: Never Used  Substance and Sexual Activity  . Alcohol use: Yes    Comment: occ.  . Drug use: No  . Sexual activity: Yes    Birth control/protection: Surgical    Comment: BTL   Lifestyle  . Physical activity:    Days per week: Not on file    Minutes per session: Not on file  . Stress: Not on file  Relationships  . Social connections:    Talks on phone: Not on file    Gets together: Not on file    Attends religious service: Not on file    Active member of club or organization: Not on file    Attends meetings of clubs or organizations: Not on file    Relationship status: Not on file  . Intimate partner violence:    Fear of current or ex partner: Not on file    Emotionally abused: Not  on file    Physically abused: Not on file    Forced sexual activity: Not on file  Other Topics Concern  . Not on file  Social History Narrative  . Not on file            Mardella Layman, MD 05/12/18 1004

## 2018-05-08 NOTE — ED Triage Notes (Signed)
Ear pain for 2-3 days ago.  Reports frequent clearing of throat.  Denies having uri.  Patient reports right ear is aching.  Today ear is muffled

## 2018-09-16 ENCOUNTER — Inpatient Hospital Stay (HOSPITAL_COMMUNITY)
Admission: AD | Admit: 2018-09-16 | Discharge: 2018-09-16 | Disposition: A | Discharge status: Home / Self Care | Payer: Medicaid Other | Source: Ambulatory Visit | Attending: Obstetrics and Gynecology | Admitting: Obstetrics and Gynecology

## 2018-09-16 ENCOUNTER — Encounter (HOSPITAL_COMMUNITY): Payer: Self-pay | Admitting: Student

## 2018-09-16 ENCOUNTER — Other Ambulatory Visit: Payer: Self-pay

## 2018-09-16 DIAGNOSIS — N760 Acute vaginitis: Secondary | ICD-10-CM

## 2018-09-16 DIAGNOSIS — R102 Pelvic and perineal pain: Secondary | ICD-10-CM | POA: Diagnosis present

## 2018-09-16 DIAGNOSIS — B9689 Other specified bacterial agents as the cause of diseases classified elsewhere: Secondary | ICD-10-CM | POA: Diagnosis not present

## 2018-09-16 DIAGNOSIS — N941 Unspecified dyspareunia: Secondary | ICD-10-CM | POA: Insufficient documentation

## 2018-09-16 DIAGNOSIS — Z3202 Encounter for pregnancy test, result negative: Secondary | ICD-10-CM | POA: Insufficient documentation

## 2018-09-16 HISTORY — DX: Unspecified infectious disease: B99.9

## 2018-09-16 LAB — WET PREP, GENITAL
SPERM: NONE SEEN
Trich, Wet Prep: NONE SEEN
YEAST WET PREP: NONE SEEN

## 2018-09-16 LAB — URINALYSIS, ROUTINE W REFLEX MICROSCOPIC
BILIRUBIN URINE: NEGATIVE
Glucose, UA: NEGATIVE mg/dL
Hgb urine dipstick: NEGATIVE
KETONES UR: NEGATIVE mg/dL
Nitrite: NEGATIVE
PH: 6 (ref 5.0–8.0)
PROTEIN: NEGATIVE mg/dL
Specific Gravity, Urine: 1.013 (ref 1.005–1.030)

## 2018-09-16 LAB — POCT PREGNANCY, URINE: PREG TEST UR: NEGATIVE

## 2018-09-16 MED ORDER — METRONIDAZOLE 0.75 % VA GEL: 1.0000 | Freq: Two times a day (BID) | VAGINAL | 0 refills | Status: DC

## 2018-09-16 NOTE — MAU Note (Signed)
Been having pain for a couple months, sharp, more serious a problem- pain during sex. Not bad right now, mild. D/c just starting white, 'has a flow to it', no odor.

## 2018-09-16 NOTE — MAU Provider Note (Signed)
Chief Complaint: Pelvic Pain and Vaginal Discharge   First Provider Initiated Contact with Patient 09/16/18 0836     SUBJECTIVE HPI: Stacey Reid is a 22 y.o. non pregnant female who presents to Maternity Admissions reporting pelvic pain & vaginal discharge.  Had BTL last summer. [facility] but states she can't go back there until she has a PCP d/t her insurance; currently does not have a PCP.  Reports pelvic pain & dyspareunia x 2 months. No change in symptoms today. Suprapubic cramping happens "occasionally" & lasts for a minute at a time. Reports "deep pain" with intercourse with subsequent pelvic cramping that lasts for 10 minutes after IC. Has taken ibuprofen without relief, although pain only lasts for a few minutes at a time.  Increase in vaginal discharge for the last week. "Clammy white" discharge. No odor or irritation.  Has been with same partner x 4 years.  Denies n/v/d, constipation, dysuria, postcoital bleeding, or fevers.   Location: pelvis Quality: cramping Severity: 3/10 on pain scale Duration: 2 months Timing: sporadically Modifying factors: worse with intercourse. Takes ibuprofen without relief Associated signs and symptoms: vaginal discharge  Past Medical History:  Diagnosis Date  . Anemia affecting pregnancy 03/21/2017  . Asthma    years ago, exercise induced  . Headache    with pregnancy  . Heart murmur    history of, resolved 3 months later  . Hyperemesis 2016  . Hypertension   . Infection    UTI  . Pregnancy induced hypertension   . Syncope    in high school, found out had a heart murmur   OB History  Gravida Para Term Preterm AB Living  3 2 2  0 1 2  SAB TAB Ectopic Multiple Live Births  1 0 0 0 2    # Outcome Date GA Lbr Len/2nd Weight Sex Delivery Anes PTL Lv  3 Term 05/29/17 3869w0d / 00:18 3295 g F Vag-Spont EPI  LIV  2 SAB 01/29/16 1973w5d         1 Term 04/02/15 3317w1d 413:16 / 01:08 2651 g M Vag-Spont EPI, Local  LIV    Past Surgical History:  Procedure Laterality Date  . LAPAROSCOPIC TUBAL LIGATION Bilateral 07/02/2017   Procedure: LAPAROSCOPIC TUBAL LIGATION - Filshie clips;  Surgeon: Hermina StaggersErvin, Michael L, MD;  Location: [facility];  Service: Gynecology;  Laterality: Bilateral;  . TUBAL LIGATION    . WISDOM TOOTH EXTRACTION     Social History   Socioeconomic History  . Marital status: Single    Spouse name: Not on file  . Number of children: Not on file  . Years of education: Not on file  . Highest education level: Not on file  Occupational History  . Not on file  Social Needs  . Financial resource strain: Not on file  . Food insecurity:    Worry: Not on file    Inability: Not on file  . Transportation needs:    Medical: Not on file    Non-medical: Not on file  Tobacco Use  . Smoking status: Never Smoker  . Smokeless tobacco: Never Used  Substance and Sexual Activity  . Alcohol use: Yes    Comment: occ.  . Drug use: No  . Sexual activity: Yes    Birth control/protection: Surgical    Comment: BTL   Lifestyle  . Physical activity:    Days per week: Not on file    Minutes per session: Not on file  .  Stress: Not on file  Relationships  . Social connections:    Talks on phone: Not on file    Gets together: Not on file    Attends religious service: Not on file    Active member of club or organization: Not on file    Attends meetings of clubs or organizations: Not on file    Relationship status: Not on file  . Intimate partner violence:    Fear of current or ex partner: Not on file    Emotionally abused: Not on file    Physically abused: Not on file    Forced sexual activity: Not on file  Other Topics Concern  . Not on file  Social History Narrative  . Not on file   Family History  Problem Relation Age of Onset  . Asthma Mother   . Hypertension Mother   . Miscarriages / [place] Mother   . Healthy Father    No current facility-administered medications on file prior to  encounter.    Current Outpatient Medications on File Prior to Encounter  Medication Sig Dispense Refill  . metroNIDAZOLE (METROGEL VAGINAL) 0.75 % vaginal gel Place 1 Applicatorful vaginally 2 (two) times daily. 70 g 0  . terconazole (TERAZOL 3) 0.8 % vaginal cream Place 1 applicator vaginally at bedtime. 20 g 0   No Known Allergies  I have reviewed patient's Past Medical Hx, Surgical Hx, Family Hx, Social Hx, medications and allergies.   Review of Systems  Constitutional: Negative.   Gastrointestinal: Negative.   Genitourinary: Positive for dyspareunia, pelvic pain and vaginal discharge. Negative for dysuria, genital sores, vaginal bleeding and vaginal pain.    OBJECTIVE Patient Vitals for the past 24 hrs:  BP Temp Temp src Pulse Resp Weight  09/16/18 0829 117/76 98.4 F (36.9 C) Oral 64 16 -  09/16/18 0824 - - - - - 64.6 kg   Constitutional: Well-developed, well-nourished female in no acute distress.  Cardiovascular: normal rate & rhythm, no murmur Respiratory: normal rate and effort. Lung sounds clear throughout GI: Abd soft, non-tender, Pos BS x 4. No guarding or rebound tenderness MS: Extremities nontender, no edema, normal ROM Neurologic: Alert and oriented x 4.  GU:     SPECULUM EXAM: NEFG, thin clear malodorous discharge, no blood noted,  cervix clean  BIMANUAL: No CMT.  uterus normal size, no adnexal tenderness or masses.    LAB RESULTS Results for orders placed or performed during the hospital encounter of 09/16/18 (from the past 24 hour(s))  Urinalysis, Routine w reflex microscopic     Status: Abnormal   Collection Time: 09/16/18  8:27 AM  Result Value Ref Range   Color, Urine YELLOW YELLOW   APPearance CLOUDY (A) CLEAR   Specific Gravity, Urine 1.013 1.005 - 1.030   pH 6.0 5.0 - 8.0   Glucose, UA NEGATIVE NEGATIVE mg/dL   Hgb urine dipstick NEGATIVE NEGATIVE   Bilirubin Urine NEGATIVE NEGATIVE   Ketones, ur NEGATIVE NEGATIVE mg/dL   Protein, ur NEGATIVE  NEGATIVE mg/dL   Nitrite NEGATIVE NEGATIVE   Leukocytes, UA TRACE (A) NEGATIVE   RBC / HPF 0-5 0 - 5 RBC/hpf   WBC, UA 6-10 0 - 5 WBC/hpf   Bacteria, UA MANY (A) NONE SEEN   Squamous Epithelial / LPF 11-20 0 - 5  Pregnancy, urine POC     Status: None   Collection Time: 09/16/18  8:34 AM  Result Value Ref Range   Preg Test, Ur NEGATIVE NEGATIVE  Wet prep, genital  Status: Abnormal   Collection Time: 09/16/18  9:00 AM  Result Value Ref Range   Yeast Wet Prep HPF POC NONE SEEN NONE SEEN   Trich, Wet Prep NONE SEEN NONE SEEN   Clue Cells Wet Prep HPF POC PRESENT (A) NONE SEEN   WBC, Wet Prep HPF POC MODERATE (A) NONE SEEN   Sperm NONE SEEN     IMAGING No results found.  MAU COURSE Orders Placed This Encounter  Procedures  . Wet prep, genital  . Urinalysis, Routine w reflex microscopic  . Pregnancy, urine POC  . Discharge patient   Meds ordered this encounter  Medications  . metroNIDAZOLE (METROGEL VAGINAL) 0.75 % vaginal gel    Sig: Place 1 Applicatorful vaginally 2 (two) times daily.    Dispense:  70 g    Refill:  0    Order Specific Question:   Supervising Provider    Answer:   Levie Heritage [[id number]]    MDM UPT negative Wet prep & GC/CT VSS. NAD.  Discussed need for further evaluation if symptoms continue. Patient to contact [facility] for follow up  ASSESSMENT 1. BV (bacterial vaginosis)   2. Dyspareunia, female     PLAN Discharge home in stable condition. GC/CT pending Rx metrogel to tx BV  Follow-up Information    [facility] Follow up.   Contact information: [address] [place] [address] [phone]         Allergies as of 09/16/2018   No Known Allergies     Medication List    STOP taking these medications   terconazole 0.8 % vaginal cream Commonly known as:  TERAZOL 3     TAKE these medications   metroNIDAZOLE 0.75 % vaginal gel Commonly known as:  METROGEL Place 1 Applicatorful  vaginally 2 (two) times daily.        Judeth Horn, NP 09/16/2018  9:58 AM

## 2018-09-16 NOTE — Discharge Instructions (Signed)
No Primary Care Doctor:  To locate a primary care doctor that accepts your insurance or provides certain services:           [place] Connect: [phone]           Physician Referral Service: [phone] ask for My [place]  If no insurance, you need to see if you qualify for [facility] orange card, call to set      up appointment for eligibility/enrollment at [phone] or [phone] or visit [facility] and [employer] ([address] [place] and [address] [place]) to meet with a [facility] enrollment specialist.      Dyspareunia, Female Dyspareunia is pain that is associated with sexual activity. This can affect any part of the genitals or lower abdomen, and there are many possible causes. This condition ranges from mild to severe. Depending on the cause, dyspareunia may get better with treatment, or it may return (recur) over time. What are the causes? The cause of this condition is not always known. Possible causes include:  Cancer.  Psychological factors, such as depression, anxiety, or previous traumatic experiences.  Severe pain and tenderness of the skin around the vagina (vulva) when it is touched (vulvar vestibulitis syndrome).  Infection of the pelvis or the vulva.  Infection of the vagina.  Painful, involuntary tightening (contraction) of the vaginal muscles when anything is put inside the vagina (vaginismus).  Allergic reaction.  Ovarian cysts.  Solid growths of tissue (tumors) in the ovaries or the uterus.  Scar tissue in the ovaries, vagina, or pelvis.  Vaginal dryness.  Thinning of the tissue (atrophy) of the vulva and vagina.  Skin conditions that affect the vulva (vulvar dermatoses), such as lichen sclerosus or lichen planus.  Endometriosis.  Tubal pregnancy.  A tilted uterus.  Uterine prolapse.  Adhesions in the vagina.  Bladder problems.  Intestinal problems.  Certain medicines.  Medical conditions such as  diabetes, arthritis, or thyroid disease.  What increases the risk? The following factors may make you more likely to develop this condition:  Having experienced physical or sexual trauma.  Having given birth more than once.  Taking birth control pills.  Having gone through menopause.  Having recently given birth, typically within the past 3-6 months.  Breastfeeding.  What are the signs or symptoms? The main symptom of this condition is pain in any part of the genitals or lower abdomen during or after sexual activity. This may include pain during sexual arousal, genital stimulation, or orgasm. Pain may get worse when anything is inserted into the vagina, or when the genitals are touched in any way, such as when sitting or wearing pants. Pain can range from mild to severe, depending on the cause of the condition. In some cases, symptoms go away with treatment and return (recur) at a later date. How is this diagnosed? This condition may be diagnosed based on:  Your symptoms, including: ? Where your pain is located. ? When your pain occurs.  Your medical history.  A physical exam. This may include a pelvic exam and a Pap test. This is a screening test that is used to check for signs of cancer of the vagina, cervix, and uterus.  Tests, including: ? Blood tests. ? Ultrasound. This uses sound waves to make a picture of the area that is being tested. ? Urine culture. This test involves checking a urine sample for signs of infection. ? Culture test. This is when your health care provider uses  a swab to collect a sample of vaginal fluid. The sample is checked for signs of infection. ? X-rays. ? MRI. ? CT scan. ? Laparoscopy. This is a procedure in which a small incision is made in your lower abdomen and a lighted, pencil-sized instrument (laparoscope) is passed through the incision and used to look inside your pelvis.  You may be referred to a health care provider who specializes in  women's health (gynecologist). In some cases, diagnosing the cause of dyspareunia can be difficult. How is this treated? Treatment depends on the cause of your condition and your symptoms. In most cases, you may need to stop sexual activity until your symptoms improve. Treatment may include:  Lubricants.  Kegel exercises or vaginal dilators.  Medicated skin creams.  Medicated vaginal creams.  Hormonal therapy.  Antibiotic medicine to prevent or fight infection.  Medicines that help to relieve pain.  Medicines that treat depression (antidepressants).  Psychological counseling.  Sex therapy.  Surgery.  Follow these instructions at home: Lifestyle  Avoid tight clothing and irritating materials around your genital and abdominal area.  Use water-based lubricants as needed. Avoid oil-based lubricants.  Do not use any products that irritate you. This may include certain condoms, spermicides, lubricants, soaps, tampons, vaginal sprays, or douches.  Always practice safe sex. Talk with your health care provider about which form of birth control (contraception) is best for you.  Maintain open communication with your sexual partner. General instructions  Take over-the-counter and prescription medicines only as told by your health care provider.  If you had tests done, it is your responsibility to get your tests results. Ask your health care provider or the department performing the test when your results will be ready.  Urinate before you engage in sexual activity.  Consider joining a support group.  Keep all follow-up visits as told by your health care provider. This is important. Contact a health care provider if:  You develop vaginal bleeding after sexual intercourse.  You develop a lump at the opening of your vagina. Seek medical care even if the lump is painless.  You have: ? Abnormal vaginal discharge. ? Vaginal dryness. ? Itchiness or irritation of your vulva or  vagina. ? A new rash. ? Symptoms that get worse or do not improve with treatment. ? A fever. ? Pain when you urinate. ? Blood in your urine. Get help right away if:  You develop severe pain in your abdomen during or shortly after sexual intercourse.  You pass out after having sexual intercourse. This information is not intended to replace advice given to you by your health care provider. Make sure you discuss any questions you have with your health care provider. Document Released: 12/30/2007 Document Revised: 04/20/2016 Document Reviewed: 07/12/2015 Elsevier Interactive Patient Education  [employer].

## 2018-09-17 LAB — GC/CHLAMYDIA PROBE AMP (~~LOC~~) NOT AT ARMC
CHLAMYDIA, DNA PROBE: NEGATIVE
Neisseria Gonorrhea: NEGATIVE

## 2020-01-08 ENCOUNTER — Other Ambulatory Visit: Payer: Self-pay

## 2020-01-08 ENCOUNTER — Emergency Department (HOSPITAL_COMMUNITY): Payer: Commercial Managed Care - PPO

## 2020-01-08 ENCOUNTER — Emergency Department (HOSPITAL_COMMUNITY)
Admission: EM | Admit: 2020-01-08 | Discharge: 2020-01-08 | Disposition: A | Discharge status: Home / Self Care | Payer: Commercial Managed Care - PPO | Attending: Emergency Medicine | Admitting: Emergency Medicine

## 2020-01-08 ENCOUNTER — Encounter (HOSPITAL_COMMUNITY): Payer: Self-pay | Admitting: Emergency Medicine

## 2020-01-08 DIAGNOSIS — R0602 Shortness of breath: Secondary | ICD-10-CM | POA: Diagnosis present

## 2020-01-08 DIAGNOSIS — J45909 Unspecified asthma, uncomplicated: Secondary | ICD-10-CM | POA: Insufficient documentation

## 2020-01-08 DIAGNOSIS — R5383 Other fatigue: Secondary | ICD-10-CM

## 2020-01-08 DIAGNOSIS — U071 COVID-19: Secondary | ICD-10-CM | POA: Insufficient documentation

## 2020-01-08 DIAGNOSIS — I10 Essential (primary) hypertension: Secondary | ICD-10-CM | POA: Insufficient documentation

## 2020-01-08 LAB — POC SARS CORONAVIRUS 2 AG -  ED: SARS Coronavirus 2 Ag: POSITIVE — AB

## 2020-01-08 NOTE — Discharge Instructions (Addendum)
You were evaluated in the emergency department for worsening shortness of breath and fatigue in the setting of a Covid infection.  Your oxygen number was good and your chest x-ray did not show any signs of pneumonia.  Please continue to isolate and use Tylenol as needed for fever and pain, drink plenty of fluids.  Return to the emergency department if any worsening or concerning symptoms.

## 2020-01-08 NOTE — ED Provider Notes (Signed)
[facility]-EMERGENCY DEPT Provider Note   CSN: [id number] Arrival date & time: 01/08/20  1919     History Chief Complaint  Patient presents with  . Shortness of Breath    Stacey Reid is a 25 y.o. female.  She said she was feeling short of breath last week and Sunday had a Covid test.  She found out 3 days ago that she was Covid positive.  She was doing okay with her symptoms until today when she had more difficulty breathing, chest tightness.  She is also had various Covid symptoms including fatigue body aches diarrhea.  The history is provided by the patient.  Shortness of Breath Severity:  Severe Onset quality:  Gradual Timing:  Intermittent Progression:  Unchanged Chronicity:  New Context: activity   Relieved by:  Nothing Worsened by:  Activity Ineffective treatments:  Rest and sitting up Associated symptoms: chest pain (tightness), cough, headaches and sore throat   Associated symptoms: no abdominal pain, no fever, no rash and no vomiting   Risk factors: no tobacco use        Past Medical History:  Diagnosis Date  . Anemia affecting pregnancy 03/21/2017  . Asthma    years ago, exercise induced  . Headache    with pregnancy  . Heart murmur    history of, resolved 3 months later  . Hyperemesis 2016  . Hypertension   . Infection    UTI  . Pregnancy induced hypertension   . Syncope    in high school, found out had a heart murmur    Patient Active Problem List   Diagnosis Date Noted  . Post-operative state 07/16/2017  . Gestational hypertension 06/25/2017  . Sickle cell trait (HCC) 11/13/2016  . Low vitamin D level 11/13/2016  . Asthma exacerbation, mild 11/05/2016    Past Surgical History:  Procedure Laterality Date  . LAPAROSCOPIC TUBAL LIGATION Bilateral 07/02/2017   Procedure: LAPAROSCOPIC TUBAL LIGATION - Filshie clips;  Surgeon: Hermina Staggers, MD;  Location: [facility];  Service: Gynecology;  Laterality: Bilateral;  .  TUBAL LIGATION    . WISDOM TOOTH EXTRACTION       OB History    Gravida  3   Para  2   Term  2   Preterm  0   AB  1   Living  2     SAB  1   TAB  0   Ectopic  0   Multiple  0   Live Births  2           Family History  Problem Relation Age of Onset  . Asthma Mother   . Hypertension Mother   . Miscarriages / [place] Mother   . Healthy Father     Social History   Tobacco Use  . Smoking status: Never Smoker  . Smokeless tobacco: Never Used  Substance Use Topics  . Alcohol use: Yes    Comment: occ.  . Drug use: No    Home Medications Prior to Admission medications   Medication Sig Start Date End Date Taking? Authorizing Provider  albuterol (VENTOLIN HFA) 108 (90 Base) MCG/ACT inhaler Inhale 1-2 puffs into the lungs every 6 (six) hours as needed for shortness of breath. 09/15/19  Yes [provider]    Allergies    Patient has no known allergies.  Review of Systems   Review of Systems  Constitutional: Positive for fatigue. Negative for fever.  HENT: Positive for sore throat.  Eyes: Negative for visual disturbance.  Respiratory: Positive for cough and shortness of breath.   Cardiovascular: Positive for chest pain (tightness).  Gastrointestinal: Negative for abdominal pain and vomiting.  Genitourinary: Negative for dysuria.  Musculoskeletal: Positive for myalgias.  Skin: Negative for rash.  Neurological: Positive for headaches.    Physical Exam Updated Vital Signs BP (!) 134/107 (BP Location: Left Arm)   Pulse 79   Temp 98.2 F (36.8 C) (Oral)   Resp 18   Ht 5\' 3"  (1.6 m)   Wt 59 kg   LMP 12/30/2019   SpO2 100%   BMI 23.03 kg/m   Physical Exam Vitals and nursing note reviewed.  Constitutional:      General: She is not in acute distress.    Appearance: She is well-developed.  HENT:     Head: Normocephalic and atraumatic.  Eyes:     Conjunctiva/sclera: Conjunctivae normal.  Cardiovascular:     Rate and Rhythm:  Normal rate and regular rhythm.     Heart sounds: No murmur.  Pulmonary:     Effort: Pulmonary effort is normal. No tachypnea or respiratory distress.     Breath sounds: Normal breath sounds.  Abdominal:     Palpations: Abdomen is soft.     Tenderness: There is no abdominal tenderness.  Musculoskeletal:        General: No deformity or signs of injury. Normal range of motion.     Cervical back: Neck supple.  Skin:    General: Skin is warm and dry.     Capillary Refill: Capillary refill takes less than 2 seconds.  Neurological:     General: No focal deficit present.     Mental Status: She is alert.     ED Results / Procedures / Treatments   Labs (all labs ordered are listed, but only abnormal results are displayed) Labs Reviewed  POC SARS CORONAVIRUS 2 AG -  ED - Abnormal; Notable for the following components:      Result Value   SARS Coronavirus 2 Ag POSITIVE (*)    All other components within normal limits    EKG EKG Interpretation  Date/Time:  Friday January 08 2020 19:42:25 EST Ventricular Rate:  82 PR Interval:    QRS Duration: 78 QT Interval:  369 QTC Calculation: 431 R Axis:   18 Text Interpretation: Sinus rhythm Biatrial enlargement new p wace inversion V1 since prior 3/18 Confirmed by Aletta Edouard [phone]) on 01/08/2020 7:55:57 PM   Radiology DG Chest Port 1 View  Result Date: 01/08/2020 CLINICAL DATA:  Chest tightness, shortness of breath and back pain EXAM: PORTABLE CHEST 1 VIEW COMPARISON:  CT 10/26/2016, radiograph 09/29/2018 FINDINGS: Lung apices are partially collimated. No consolidation, features of edema, pneumothorax, or effusion. The cardiomediastinal contours are unremarkable. No acute osseous or soft tissue abnormality. Bilateral nipple ornamentation is noted. IMPRESSION: No acute cardiopulmonary findings. Partial collimation of the uppermost lung apices. Electronically Signed   By: Lovena Le M.D.   On: 01/08/2020 20:18    Procedures Procedures  (including critical care time)  Medications Ordered in ED Medications - No data to display  ED Course  I have reviewed the triage vital signs and the nursing notes.  Pertinent labs & imaging results that were available during my care of the patient were reviewed by me and considered in my medical decision making (see chart for details).  Clinical Course as of Jan 08 1025  Fri Jan 08, 2276  1458 24 year old female known Covid positive  here with worsening shortness of breath probable day 7 of her illness.  Satting 100% on room air.  Not tachycardic.  Chest x-ray showing no acute disease.  EKG does not show any acute ST-T's.  Symptoms likely due to Covid.  No evidence of pneumonia.  Doubt PE.  Do not feel she needs any further labs currently.  Will review with patient and have her follow-up with PCP with clear instructions for return if worsening shortness of breath or other concerning symptoms.   [MB]    Clinical Course User Index [MB] Terrilee Files, MD   MDM Rules/Calculators/A&P                     Clinical Course as of Jan 08 1025  Fri Jan 08, 2020  5330 24 year old female known Covid positive here with worsening shortness of breath probable day 7 of her illness.  Satting 100% on room air.  Not tachycardic.  Chest x-ray showing no acute disease.  EKG does not show any acute ST-T's.  Symptoms likely due to Covid.  No evidence of pneumonia.  Doubt PE.  Do not feel she needs any further labs currently.  Will review with patient and have her follow-up with PCP with clear instructions for return if worsening shortness of breath or other concerning symptoms.   [MB]    Clinical Course User Index [MB] Terrilee Files, MD   [name] was evaluated in Emergency Department on 01/08/2020 for the symptoms described in the history of present illness. She was evaluated in the context of the global COVID-19 pandemic, which necessitated consideration that the patient might be at risk for  infection with the SARS-CoV-2 virus that causes COVID-19. Institutional protocols and algorithms that pertain to the evaluation of patients at risk for COVID-19 are in a state of rapid change based on information released by regulatory bodies including the CDC and federal and state organizations. These policies and algorithms were followed during the patient's care in the ED.   Final Clinical Impression(s) / ED Diagnoses Final diagnoses:  SOB (shortness of breath)  COVID-19 virus infection  Fatigue, unspecified type    Rx / DC Orders ED Discharge Orders    None       Terrilee Files, MD 01/09/20 1027

## 2020-01-08 NOTE — ED Triage Notes (Signed)
Patient states that she has tightness in her chest, shortness of breath, and back pain. Patient states that she started having different breathing today when she was sitting on the toilet. Patient state that she went to [facility] urgent care Sunday and tested positive on Tuesday. No positive result in the chart

## 2020-01-15 ENCOUNTER — Encounter (HOSPITAL_BASED_OUTPATIENT_CLINIC_OR_DEPARTMENT_OTHER): Payer: Self-pay | Admitting: Emergency Medicine

## 2020-01-15 ENCOUNTER — Emergency Department (HOSPITAL_BASED_OUTPATIENT_CLINIC_OR_DEPARTMENT_OTHER): Payer: Commercial Managed Care - PPO

## 2020-01-15 ENCOUNTER — Other Ambulatory Visit: Payer: Self-pay

## 2020-01-15 ENCOUNTER — Emergency Department (HOSPITAL_BASED_OUTPATIENT_CLINIC_OR_DEPARTMENT_OTHER)
Admission: EM | Admit: 2020-01-15 | Discharge: 2020-01-15 | Disposition: A | Discharge status: Home / Self Care | Payer: Commercial Managed Care - PPO | Attending: Emergency Medicine | Admitting: Emergency Medicine

## 2020-01-15 DIAGNOSIS — F17210 Nicotine dependence, cigarettes, uncomplicated: Secondary | ICD-10-CM | POA: Insufficient documentation

## 2020-01-15 DIAGNOSIS — Z8616 Personal history of COVID-19: Secondary | ICD-10-CM | POA: Diagnosis not present

## 2020-01-15 DIAGNOSIS — J45909 Unspecified asthma, uncomplicated: Secondary | ICD-10-CM | POA: Insufficient documentation

## 2020-01-15 DIAGNOSIS — I10 Essential (primary) hypertension: Secondary | ICD-10-CM | POA: Insufficient documentation

## 2020-01-15 DIAGNOSIS — Z79899 Other long term (current) drug therapy: Secondary | ICD-10-CM | POA: Diagnosis not present

## 2020-01-15 DIAGNOSIS — R079 Chest pain, unspecified: Secondary | ICD-10-CM

## 2020-01-15 LAB — CBC WITH DIFFERENTIAL/PLATELET
Abs Immature Granulocytes: 0.01 10*3/uL (ref 0.00–0.07)
Basophils Absolute: 0 10*3/uL (ref 0.0–0.1)
Basophils Relative: 0 %
Eosinophils Absolute: 0.1 10*3/uL (ref 0.0–0.5)
Eosinophils Relative: 2 %
HCT: 36 % (ref 36.0–46.0)
Hemoglobin: 12.2 g/dL (ref 12.0–15.0)
Immature Granulocytes: 0 %
Lymphocytes Relative: 61 %
Lymphs Abs: 3.1 10*3/uL (ref 0.7–4.0)
MCH: 30.8 pg (ref 26.0–34.0)
MCHC: 33.9 g/dL (ref 30.0–36.0)
MCV: 90.9 fL (ref 80.0–100.0)
Monocytes Absolute: 0.5 10*3/uL (ref 0.1–1.0)
Monocytes Relative: 9 %
Neutro Abs: 1.4 10*3/uL — ABNORMAL LOW (ref 1.7–7.7)
Neutrophils Relative %: 28 %
Platelets: 334 10*3/uL (ref 150–400)
RBC: 3.96 MIL/uL (ref 3.87–5.11)
RDW: 10.8 % — ABNORMAL LOW (ref 11.5–15.5)
WBC: 5 10*3/uL (ref 4.0–10.5)
nRBC: 0 % (ref 0.0–0.2)

## 2020-01-15 LAB — BASIC METABOLIC PANEL
Anion gap: 4 — ABNORMAL LOW (ref 5–15)
BUN: 14 mg/dL (ref 6–20)
CO2: 27 mmol/L (ref 22–32)
Calcium: 8.7 mg/dL — ABNORMAL LOW (ref 8.9–10.3)
Chloride: 108 mmol/L (ref 98–111)
Creatinine, Ser: 0.74 mg/dL (ref 0.44–1.00)
GFR calc Af Amer: >60 mL/min (ref >60)
GFR calc non Af Amer: >60 mL/min (ref >60)
Glucose, Bld: 106 mg/dL — ABNORMAL HIGH (ref 70–99)
Potassium: 3.7 mmol/L (ref 3.5–5.1)
Sodium: 139 mmol/L (ref 135–145)

## 2020-01-15 LAB — TROPONIN I (HIGH SENSITIVITY): Troponin I (High Sensitivity): 10 ng/L (ref <18)

## 2020-01-15 LAB — HCG, SERUM, QUALITATIVE: Preg, Serum: NEGATIVE

## 2020-01-15 LAB — D-DIMER, QUANTITATIVE: D-Dimer, Quant: 0.53 ug/mL-FEU — ABNORMAL HIGH (ref 0.00–0.50)

## 2020-01-15 MED ORDER — SODIUM CHLORIDE 0.9 % IV BOLUS
1000.0000 mL | Freq: Once | INTRAVENOUS | Status: AC
  Administered 2020-01-15: 1000 mL via INTRAVENOUS

## 2020-01-15 MED ORDER — IOHEXOL 350 MG/ML SOLN
75.0000 mL | Freq: Once | INTRAVENOUS | Status: AC | PRN
  Administered 2020-01-15: 75 mL via INTRAVENOUS

## 2020-01-15 NOTE — ED Provider Notes (Signed)
[facility] EMERGENCY DEPARTMENT Provider Note   CSN: [id number] Arrival date & time: 01/15/20  0151     History Chief Complaint  Patient presents with  . Chest Pain    Stacey Reid is a 24 y.o. female.  Patient presents for evaluation of chest pain.  Patient presents that pain started tonight.  She has a sharp pain across her upper chest that seems to worsen when she takes a deep breath.  Patient was diagnosed with Covid 2 weeks ago.  She reports she was diagnosed at that time because she was having a lot of shortness of breath.  She did not have this associated chest pain.  She does report her breathing has improved since her initial diagnosis.  Pain is across the upper chest bilaterally.        Past Medical History:  Diagnosis Date  . Anemia affecting pregnancy 03/21/2017  . Asthma    years ago, exercise induced  . Headache    with pregnancy  . Heart murmur    history of, resolved 3 months later  . Hyperemesis 2016  . Hypertension   . Infection    UTI  . Pregnancy induced hypertension   . Syncope    in high school, found out had a heart murmur    Patient Active Problem List   Diagnosis Date Noted  . Post-operative state 07/16/2017  . Gestational hypertension 06/25/2017  . Sickle cell trait (HCC) 11/13/2016  . Low vitamin D level 11/13/2016  . Asthma exacerbation, mild 11/05/2016    Past Surgical History:  Procedure Laterality Date  . LAPAROSCOPIC TUBAL LIGATION Bilateral 07/02/2017   Procedure: LAPAROSCOPIC TUBAL LIGATION - Filshie clips;  Surgeon: Hermina Staggers, MD;  Location: [facility];  Service: Gynecology;  Laterality: Bilateral;  . TUBAL LIGATION    . WISDOM TOOTH EXTRACTION       OB History    Gravida  3   Para  2   Term  2   Preterm  0   AB  1   Living  2     SAB  1   TAB  0   Ectopic  0   Multiple  0   Live Births  2           Family History  Problem Relation Age of Onset  . Asthma Mother   .  Hypertension Mother   . Miscarriages / [place] Mother   . Healthy Father     Social History   Tobacco Use  . Smoking status: Current Some Day Smoker    Types: Cigarettes  . Smokeless tobacco: Never Used  Substance Use Topics  . Alcohol use: Yes    Comment: occ.  . Drug use: No    Home Medications Prior to Admission medications   Medication Sig Start Date End Date Taking? Authorizing Provider  albuterol (VENTOLIN HFA) 108 (90 Base) MCG/ACT inhaler Inhale 1-2 puffs into the lungs every 6 (six) hours as needed for shortness of breath. 09/15/19   [provider]    Allergies    Patient has no known allergies.  Review of Systems   Review of Systems  Cardiovascular: Positive for chest pain.  All other systems reviewed and are negative.   Physical Exam Updated Vital Signs BP 123/84 (BP Location: Left Arm)   Pulse 80   Temp 98.2 F (36.8 C) (Oral)   Resp 16   Ht 5\' 3"  (1.6 m)   Wt 56.7 kg  LMP 12/30/2019 (Exact Date)   SpO2 100%   BMI 22.14 kg/m   Physical Exam Vitals and nursing note reviewed.  Constitutional:      General: She is not in acute distress.    Appearance: Normal appearance. She is well-developed.  HENT:     Head: Normocephalic and atraumatic.     Right Ear: Hearing normal.     Left Ear: Hearing normal.     Nose: Nose normal.  Eyes:     Conjunctiva/sclera: Conjunctivae normal.     Pupils: Pupils are equal, round, and reactive to light.  Cardiovascular:     Rate and Rhythm: Regular rhythm.     Heart sounds: S1 normal and S2 normal. No murmur. No friction rub. No gallop.   Pulmonary:     Effort: Pulmonary effort is normal. No respiratory distress.     Breath sounds: Normal breath sounds.  Chest:     Chest wall: No tenderness.  Abdominal:     General: Bowel sounds are normal.     Palpations: Abdomen is soft.     Tenderness: There is no abdominal tenderness. There is no guarding or rebound. Negative signs include Murphy's sign and  McBurney's sign.     Hernia: No hernia is present.  Musculoskeletal:        General: Normal range of motion.     Cervical back: Normal range of motion and neck supple.  Skin:    General: Skin is warm and dry.     Findings: No rash.  Neurological:     Mental Status: She is alert and oriented to person, place, and time.     GCS: GCS eye subscore is 4. GCS verbal subscore is 5. GCS motor subscore is 6.     Cranial Nerves: No cranial nerve deficit.     Sensory: No sensory deficit.     Coordination: Coordination normal.  Psychiatric:        Speech: Speech normal.        Behavior: Behavior normal.        Thought Content: Thought content normal.     ED Results / Procedures / Treatments   Labs (all labs ordered are listed, but only abnormal results are displayed) Labs Reviewed  CBC WITH DIFFERENTIAL/PLATELET - Abnormal; Notable for the following components:      Result Value   RDW 10.8 (*)    Neutro Abs 1.4 (*)    All other components within normal limits  BASIC METABOLIC PANEL - Abnormal; Notable for the following components:   Glucose, Bld 106 (*)    Calcium 8.7 (*)    Anion gap 4 (*)    All other components within normal limits  D-DIMER, QUANTITATIVE (NOT AT [facility]) - Abnormal; Notable for the following components:   D-Dimer, Quant 0.53 (*)    All other components within normal limits  HCG, SERUM, QUALITATIVE  TROPONIN I (HIGH SENSITIVITY)    EKG EKG Interpretation  Date/Time:  Friday January 15 2020 02:11:05 EST Ventricular Rate:  72 PR Interval:    QRS Duration: 86 QT Interval:  400 QTC Calculation: 438 R Axis:   12 Text Interpretation: Sinus rhythm Normal ECG Confirmed by Orpah Greek [phone]) on 01/15/2020 2:18:33 AM   Radiology CT ANGIO CHEST PE W OR WO CONTRAST  Result Date: 01/15/2020 CLINICAL DATA:  Shortness of breath EXAM: CT ANGIOGRAPHY CHEST WITH CONTRAST TECHNIQUE: Multidetector CT imaging of the chest was performed using the standard protocol  during bolus administration of intravenous  contrast. Multiplanar CT image reconstructions and MIPs were obtained to evaluate the vascular anatomy. CONTRAST:  43mL OMNIPAQUE IOHEXOL 350 MG/ML SOLN COMPARISON:  None. FINDINGS: Cardiovascular: Contrast injection is sufficient to demonstrate satisfactory opacification of the pulmonary arteries to the segmental level. There is no pulmonary embolus or evidence of right heart strain. The size of the main pulmonary artery is normal. Heart size is normal, with no pericardial effusion. The course and caliber of the aorta are normal. There is no atherosclerotic calcification. Opacification decreased due to pulmonary arterial phase contrast bolus timing. Mediastinum/Nodes: No mediastinal, hilar or axillary lymphadenopathy. The visualized thyroid and thoracic esophageal course are unremarkable. Lungs/Pleura: Airways are patent. No focal consolidation, pulmonary infarct or pleural effusion. Upper Abdomen: Contrast bolus timing is not optimized for evaluation of the abdominal organs. The visualized portions of the organs of the upper abdomen are normal. Musculoskeletal: No chest wall abnormality. No bony spinal canal stenosis. Review of the MIP images confirms the above findings. IMPRESSION: 1. No pulmonary embolus. 2. No acute thoracic abnormality. Electronically Signed   By: Deatra Robinson M.D.   On: 01/15/2020 03:49    Procedures Procedures (including critical care time)  Medications Ordered in ED Medications  sodium chloride 0.9 % bolus 1,000 mL (1,000 mLs Intravenous New Bag/Given 01/15/20 0344)  iohexol (OMNIPAQUE) 350 MG/ML injection 75 mL (75 mLs Intravenous Contrast Given 01/15/20 0331)    ED Course  I have reviewed the triage vital signs and the nursing notes.  Pertinent labs & imaging results that were available during my care of the patient were reviewed by me and considered in my medical decision making (see chart for details).    MDM  Rules/Calculators/A&P                      Patient presents to the emergency department for evaluation of chest pain.  Patient complaining of pain diffusely across her upper chest.  Pain is pleuritic in nature.  She does not have any cardiac risk factors.  No obvious ischemia or infarct on EKG.  Troponin negative.  Vital signs are normal, no tachypnea, tachycardia or hypoxia.  Because of her recent Covid infection, however, PE is centered.  CT scan performed because her D-dimer was mildly elevated.  CT does not show any evidence of PE.  There are no findings of Covid pneumonia either.  Patient reassured, likely musculoskeletal chest pain.  No further work-up necessary. Final Clinical Impression(s) / ED Diagnoses Final diagnoses:  Chest pain, unspecified type    Rx / DC Orders ED Discharge Orders    None       Shanelle Clontz, Canary Brim, MD 01/15/20 [phone]

## 2020-01-15 NOTE — ED Notes (Signed)
ED Provider at bedside. 

## 2020-01-15 NOTE — ED Triage Notes (Signed)
Pt states she tested positive for COVID a couple weeks ago and it was positive  Pt states she was in bed tonight and started having left side numbness and pain in her chest with breathing  Worse when she takes a deep breath

## 2020-01-23 ENCOUNTER — Emergency Department (HOSPITAL_COMMUNITY): Payer: Commercial Managed Care - PPO

## 2020-01-23 ENCOUNTER — Other Ambulatory Visit: Payer: Self-pay

## 2020-01-23 ENCOUNTER — Emergency Department (HOSPITAL_COMMUNITY)
Admission: EM | Admit: 2020-01-23 | Discharge: 2020-01-24 | Disposition: A | Discharge status: Home / Self Care | Payer: Commercial Managed Care - PPO | Attending: Emergency Medicine | Admitting: Emergency Medicine

## 2020-01-23 ENCOUNTER — Encounter (HOSPITAL_COMMUNITY): Payer: Self-pay

## 2020-01-23 DIAGNOSIS — Z8616 Personal history of COVID-19: Secondary | ICD-10-CM | POA: Insufficient documentation

## 2020-01-23 DIAGNOSIS — F17210 Nicotine dependence, cigarettes, uncomplicated: Secondary | ICD-10-CM | POA: Insufficient documentation

## 2020-01-23 DIAGNOSIS — R0789 Other chest pain: Secondary | ICD-10-CM | POA: Diagnosis present

## 2020-01-23 DIAGNOSIS — I10 Essential (primary) hypertension: Secondary | ICD-10-CM | POA: Diagnosis not present

## 2020-01-23 DIAGNOSIS — J45909 Unspecified asthma, uncomplicated: Secondary | ICD-10-CM | POA: Insufficient documentation

## 2020-01-23 NOTE — ED Triage Notes (Signed)
Pt reports chest pain and numbness. She recovered from COVID earlier this week and states that when she tried to start smoking again, her chest started hurting. Today, she ate an edible and her chest has been hurting since.

## 2020-01-23 NOTE — ED Provider Notes (Signed)
[facility] DEPT Provider Note   CSN: [id number] Arrival date & time: 01/23/20  2151     History Chief Complaint  Patient presents with  . Chest Pain    Stacey Reid is a 24 y.o. female.  Patient to ED with symptoms of SOB and chest tightness. She recently had COVID, diagnosed on 01/03/20 with symptoms at that time of body aches, cough, SOB, loss of taste/smell. She felt her COVID symptoms had resolved at about day 10 (01/13/20) and she was feeling at her baseline state of health. She tried to reintroduce cigarette smoking and immediately felt chest discomfort and tightening. She presented to the ED and was discharged home after a negative evaluation, including CTA r/o PE. She has not smoke cigarettes since that day. This evening when she went to bed she feels she was almost asleep and her body jerked, causing her to wake suddenly when she had symptoms of jitters, left sided numbness, SOB, restlessness. No recent fever, cough. She used her Albuterol inhaler without relief of the SOB and returned to the ED for further evaluation. She reports that on the way to the ED she did not feel she could drive safely and call EMS from her car. She pulled into a parking lot when paramedics met her, examined her and then followed her in her vehicle to safe arrival at the ED.   The history is provided by the patient. No language interpreter was used.  Chest Pain Associated symptoms: shortness of breath   Associated symptoms: no fever        Past Medical History:  Diagnosis Date  . Anemia affecting pregnancy 03/21/2017  . Asthma    years ago, exercise induced  . Headache    with pregnancy  . Heart murmur    history of, resolved 3 months later  . Hyperemesis 2016  . Hypertension   . Infection    UTI  . Pregnancy induced hypertension   . Syncope    in high school, found out had a heart murmur    Patient Active Problem List   Diagnosis Date Noted  .  Post-operative state 07/16/2017  . Gestational hypertension 06/25/2017  . Sickle cell trait ([place]) 11/13/2016  . Low vitamin D level 11/13/2016  . Asthma exacerbation, mild 11/05/2016    Past Surgical History:  Procedure Laterality Date  . LAPAROSCOPIC TUBAL LIGATION Bilateral 07/02/2017   Procedure: LAPAROSCOPIC TUBAL LIGATION - Filshie clips;  Surgeon: Chancy Milroy, MD;  Location: [facility];  Service: Gynecology;  Laterality: Bilateral;  . TUBAL LIGATION    . WISDOM TOOTH EXTRACTION       OB History    Gravida  3   Para  2   Term  2   Preterm  0   AB  1   Living  2     SAB  1   TAB  0   Ectopic  0   Multiple  0   Live Births  2           Family History  Problem Relation Age of Onset  . Asthma Mother   . Hypertension Mother   . Miscarriages / [place] Mother   . Healthy Father     Social History   Tobacco Use  . Smoking status: Current Some Day Smoker    Types: Cigarettes  . Smokeless tobacco: Never Used  Substance Use Topics  . Alcohol use: Yes    Comment: occ.  . Drug  use: No    Home Medications Prior to Admission medications   Medication Sig Start Date End Date Taking? Authorizing Provider  albuterol (VENTOLIN HFA) 108 (90 Base) MCG/ACT inhaler Inhale 1-2 puffs into the lungs every 6 (six) hours as needed for shortness of breath. 09/15/19   [provider]    Allergies    Patient has no known allergies.  Review of Systems   Review of Systems  Constitutional: Negative for chills and fever.  HENT: Negative.   Respiratory: Positive for shortness of breath.   Cardiovascular: Positive for chest pain.  Gastrointestinal: Negative.   Musculoskeletal: Negative.   Skin: Negative.   Neurological: Negative.   Psychiatric/Behavioral: The patient is nervous/anxious.     Physical Exam Updated Vital Signs BP 112/77   Pulse 75   Temp 98.2 F (36.8 C) (Oral)   Resp 18   LMP 12/30/2019 (Exact Date)   SpO2 98%   Physical  Exam Vitals and nursing note reviewed.  Constitutional:      Appearance: She is well-developed.  HENT:     Head: Normocephalic.  Cardiovascular:     Rate and Rhythm: Normal rate and regular rhythm.  Pulmonary:     Effort: Pulmonary effort is normal.     Breath sounds: Normal breath sounds. No wheezing, rhonchi or rales.  Abdominal:     General: Bowel sounds are normal.     Palpations: Abdomen is soft.     Tenderness: There is no abdominal tenderness. There is no guarding or rebound.  Musculoskeletal:        General: Normal range of motion.     Cervical back: Normal range of motion and neck supple.  Skin:    General: Skin is warm and dry.     Findings: No rash.  Neurological:     Mental Status: She is alert and oriented to person, place, and time.     ED Results / Procedures / Treatments   Labs (all labs ordered are listed, but only abnormal results are displayed) Labs Reviewed - No data to display  EKG None  Radiology DG Chest Portable 1 View  Result Date: 01/24/2020 CLINICAL DATA:  Chest tightness, diagnosed with COVID-19 01/08/2020 EXAM: PORTABLE CHEST 1 VIEW COMPARISON:  Radiograph 04/16/2013 FINDINGS: No consolidation, features of edema, pneumothorax, or effusion. Pulmonary vascularity is normally distributed. The cardiomediastinal contours are unremarkable. No acute osseous or soft tissue abnormality. Bilateral metallic nipple ornamentation is noted. Telemetry leads overlie the chest. IMPRESSION: Negative for acute cardiopulmonary disease. Electronically Signed   By: Lovena Le M.D.   On: 01/24/2020 00:09   CT ANGIO CHEST PE W OR WO CONTRAST  Result Date: 01/15/2020 CLINICAL DATA:  Shortness of breath EXAM: CT ANGIOGRAPHY CHEST WITH CONTRAST TECHNIQUE: Multidetector CT imaging of the chest was performed using the standard protocol during bolus administration of intravenous contrast. Multiplanar CT image reconstructions and MIPs were obtained to evaluate the vascular  anatomy. CONTRAST:  42m OMNIPAQUE IOHEXOL 350 MG/ML SOLN COMPARISON:  None. FINDINGS: Cardiovascular: Contrast injection is sufficient to demonstrate satisfactory opacification of the pulmonary arteries to the segmental level. There is no pulmonary embolus or evidence of right heart strain. The size of the main pulmonary artery is normal. Heart size is normal, with no pericardial effusion. The course and caliber of the aorta are normal. There is no atherosclerotic calcification. Opacification decreased due to pulmonary arterial phase contrast bolus timing. Mediastinum/Nodes: No mediastinal, hilar or axillary lymphadenopathy. The visualized thyroid and thoracic esophageal course are unremarkable.  Lungs/Pleura: Airways are patent. No focal consolidation, pulmonary infarct or pleural effusion. Upper Abdomen: Contrast bolus timing is not optimized for evaluation of the abdominal organs. The visualized portions of the organs of the upper abdomen are normal. Musculoskeletal: No chest wall abnormality. No bony spinal canal stenosis. Review of the MIP images confirms the above findings. IMPRESSION: 1. No pulmonary embolus. 2. No acute thoracic abnormality. Electronically Signed   By: Ulyses Jarred M.D.   On: 01/15/2020 03:49   DG Chest Portable 1 View  Result Date: 01/24/2020 CLINICAL DATA:  Chest tightness, diagnosed with COVID-19 01/08/2020 EXAM: PORTABLE CHEST 1 VIEW COMPARISON:  Radiograph 04/16/2013 FINDINGS: No consolidation, features of edema, pneumothorax, or effusion. Pulmonary vascularity is normally distributed. The cardiomediastinal contours are unremarkable. No acute osseous or soft tissue abnormality. Bilateral metallic nipple ornamentation is noted. Telemetry leads overlie the chest. IMPRESSION: Negative for acute cardiopulmonary disease. Electronically Signed   By: Lovena Le M.D.   On: 01/24/2020 00:09   DG Chest Port 1 View  Result Date: 01/08/2020 CLINICAL DATA:  Chest tightness, shortness of  breath and back pain EXAM: PORTABLE CHEST 1 VIEW COMPARISON:  CT 10/26/2016, radiograph 09/29/2018 FINDINGS: Lung apices are partially collimated. No consolidation, features of edema, pneumothorax, or effusion. The cardiomediastinal contours are unremarkable. No acute osseous or soft tissue abnormality. Bilateral nipple ornamentation is noted. IMPRESSION: No acute cardiopulmonary findings. Partial collimation of the uppermost lung apices. Electronically Signed   By: Lovena Le M.D.   On: 01/08/2020 20:18    Procedures Procedures (including critical care time)  Medications Ordered in ED Medications - No data to display  ED Course  I have reviewed the triage vital signs and the nursing notes.  Pertinent labs & imaging results that were available during my care of the patient were reviewed by me and considered in my medical decision making (see chart for details).    MDM Rules/Calculators/A&P                      Patient to ED with symptoms tonight of SOB, chest tightness, as further detailed in the HPI. She is currently much better.   No tachycardia, hypoxia. The patient appears comfortable. She has full and clear breath sounds on exam. No chest tenderness.   Symptoms started after waking suddenly. Possibly related to anxiety/panic. No ss/sxs PE, negative CTA on 01/15/20. CXR tonight clear.   She is encouraged to continue use of her inhaler every 4 hours, continue smoking cessation. She is felt appropriate for discharge home.  Final Clinical Impression(s) / ED Diagnoses Final diagnoses:  None   1. Chest tightness  Rx / DC Orders ED Discharge Orders    None       Charlann Lange, PA-C 01/24/20 [place], [place], MD 01/24/20 [phone]

## 2020-01-24 NOTE — ED Notes (Signed)
Pt ambulatory in room with steady gait. O2 remained at 100%, HR ranged between 88-90 bpm. Melvenia Beam, PA made aware.

## 2020-01-24 NOTE — Discharge Instructions (Signed)
Your exam and chest x-ray tonight are normal. You can be discharged home safely.   Continue use of your inhaler every 4-6 hours as needed for any sense of chest tightness. Continue not smoking cigarettes. Return to the ED any time you feel additional evaluation is needed.

## 2020-02-08 ENCOUNTER — Emergency Department (HOSPITAL_COMMUNITY): Payer: Commercial Managed Care - PPO

## 2020-02-08 ENCOUNTER — Other Ambulatory Visit: Payer: Self-pay

## 2020-02-08 ENCOUNTER — Emergency Department (HOSPITAL_COMMUNITY)
Admission: EM | Admit: 2020-02-08 | Discharge: 2020-02-08 | Disposition: A | Discharge status: Home / Self Care | Payer: Commercial Managed Care - PPO | Attending: Emergency Medicine | Admitting: Emergency Medicine

## 2020-02-08 ENCOUNTER — Encounter (HOSPITAL_COMMUNITY): Payer: Self-pay

## 2020-02-08 DIAGNOSIS — F17210 Nicotine dependence, cigarettes, uncomplicated: Secondary | ICD-10-CM | POA: Diagnosis not present

## 2020-02-08 DIAGNOSIS — R519 Headache, unspecified: Secondary | ICD-10-CM | POA: Insufficient documentation

## 2020-02-08 DIAGNOSIS — Z79899 Other long term (current) drug therapy: Secondary | ICD-10-CM | POA: Diagnosis not present

## 2020-02-08 DIAGNOSIS — I10 Essential (primary) hypertension: Secondary | ICD-10-CM | POA: Insufficient documentation

## 2020-02-08 DIAGNOSIS — R200 Anesthesia of skin: Secondary | ICD-10-CM

## 2020-02-08 DIAGNOSIS — R52 Pain, unspecified: Secondary | ICD-10-CM

## 2020-02-08 LAB — CBC
HCT: 39.5 % (ref 36.0–46.0)
Hemoglobin: 13.4 g/dL (ref 12.0–15.0)
MCH: 31.3 pg (ref 26.0–34.0)
MCHC: 33.9 g/dL (ref 30.0–36.0)
MCV: 92.3 fL (ref 80.0–100.0)
Platelets: 295 10*3/uL (ref 150–400)
RBC: 4.28 MIL/uL (ref 3.87–5.11)
RDW: 11.9 % (ref 11.5–15.5)
WBC: 3 10*3/uL — ABNORMAL LOW (ref 4.0–10.5)
nRBC: 0 % (ref 0.0–0.2)

## 2020-02-08 LAB — BASIC METABOLIC PANEL
Anion gap: 8 (ref 5–15)
BUN: 10 mg/dL (ref 6–20)
CO2: 26 mmol/L (ref 22–32)
Calcium: 9.1 mg/dL (ref 8.9–10.3)
Chloride: 106 mmol/L (ref 98–111)
Creatinine, Ser: 0.77 mg/dL (ref 0.44–1.00)
GFR calc Af Amer: >60 mL/min (ref >60)
GFR calc non Af Amer: >60 mL/min (ref >60)
Glucose, Bld: 91 mg/dL (ref 70–99)
Potassium: 3.8 mmol/L (ref 3.5–5.1)
Sodium: 140 mmol/L (ref 135–145)

## 2020-02-08 LAB — I-STAT BETA HCG BLOOD, ED (MC, WL, AP ONLY): I-stat hCG, quantitative: <5 m[IU]/mL (ref <5)

## 2020-02-08 LAB — TROPONIN I (HIGH SENSITIVITY)
Troponin I (High Sensitivity): <2 ng/L (ref <18)
Troponin I (High Sensitivity): <2 ng/L (ref <18)

## 2020-02-08 MED ORDER — SODIUM CHLORIDE 0.9% FLUSH: 3.0000 mL | Freq: Once | INTRAVENOUS | Status: DC

## 2020-02-08 MED ORDER — METOCLOPRAMIDE HCL 10 MG PO TABS: 10.0000 mg | ORAL_TABLET | Freq: Four times a day (QID) | ORAL | 0 refills | Status: DC

## 2020-02-08 MED ORDER — METOCLOPRAMIDE HCL 5 MG/ML IJ SOLN
10.0000 mg | Freq: Once | INTRAMUSCULAR | Status: AC
  Administered 2020-02-08: 10 mg via INTRAVENOUS
  Filled 2020-02-08: qty 2

## 2020-02-08 MED ORDER — KETOROLAC TROMETHAMINE 30 MG/ML IJ SOLN
15.0000 mg | Freq: Once | INTRAMUSCULAR | Status: AC
  Administered 2020-02-08: 15 mg via INTRAVENOUS
  Filled 2020-02-08: qty 1

## 2020-02-08 MED ORDER — DEXAMETHASONE SODIUM PHOSPHATE 10 MG/ML IJ SOLN
10.0000 mg | Freq: Once | INTRAMUSCULAR | Status: AC
  Administered 2020-02-08: 10 mg via INTRAVENOUS
  Filled 2020-02-08: qty 1

## 2020-02-08 MED ORDER — SODIUM CHLORIDE 0.9 % IV BOLUS
1000.0000 mL | Freq: Once | INTRAVENOUS | Status: AC
  Administered 2020-02-08: 1000 mL via INTRAVENOUS

## 2020-02-08 NOTE — Discharge Instructions (Addendum)
Headache Your lab results showed no acute abnormalities.  For future headaches please try the following regimen: Antiinflammatory medications: Take 600 mg of ibuprofen every 6 hours or 440 mg (over the counter dose) to 500 mg (prescription dose) of naproxen every 12 hours.  Use this regimen until headache subsides for up to 3 days .  After this time, these medications may be used as needed for pain. Take these medications with food to avoid upset stomach. Choose only one of these medications, do not take them together. Acetaminophen: Should you continue to have additional pain while taking the ibuprofen or naproxen, you may add in acetaminophen (generic for Tylenol) as needed. Your daily total maximum amount of acetaminophen from all sources should be limited to 4000mg /day for persons without liver problems, or 2000mg /day for those with liver problems. Hydration: Have a goal of about a half liter of water every couple hours to stay well hydrated.  Metoclopramide: Metoclopramide (generic for Reglan) is a medication that can be used for nausea/vomiting and/or headache. Sleep: Please be sure to get plenty of sleep with a goal of 8 hours per night. Having a regular bed time and bedtime routine can help with this.  Screens: Reduce the amount of time you are in front of screens.  Take about a 5-10-minute break every hour or every couple hours to give your eyes rest.  Do not use screens in dark rooms.  Glasses with a blue light filter may also help reduce eye fatigue.  Stress: Take steps to reduce stress as much as possible.   Follow up: Follow-up with your primary care provider on this issue.  May also need to follow-up with the neurologist for increased frequency of headaches.  Should chest discomfort persist, please follow-up with your primary care provider or cardiologist on this matter.

## 2020-02-08 NOTE — ED Triage Notes (Signed)
Patient c/o "numbness all over", chest pain, and "all over back pain" x 1 week. Patient states she had Covid 2 weeks ago.

## 2020-02-08 NOTE — ED Provider Notes (Signed)
[facility]-EMERGENCY DEPT Provider Note   CSN: [id number] Arrival date & time: 02/08/20  1024     History Chief Complaint  Patient presents with  . Numbness  . Chest Pain  . Back Pain    Stacey Reid is a 24 y.o. female.  HPI      Stacey Reid is a 24 y.o. female, with a history of anxiety, depression, HTN, headaches, presenting to the ED with sensation of tingling throughout her body, constant for about a week.  Intermittent chest, back, and shoulder aching. Headaches, occipital, resolve with ibuprofen, dull, nonradiating. Intermittent lightheadedness and nausea.  She states she had COVID-19 infection January 2021 with symptoms resolving around January 20.  She has not had any repeat symptoms until her present constellation of symptoms.  States her primary care provider is Rolly Salter at [facility].  She last saw her in the office Friday, February 12.  Exam was performed, she states they also performed functioning test of her lungs and took blood work for thyroid.  She states previously her thyroid tests were low, but that her PCP was monitoring them for the last several months.  She has not been placed on medication for thyroid dysfunction.    Denies fever/chills, vomiting, diarrhea, shortness of breath, abdominal pain, syncope, confusion, focal weakness, lower extremity edema/pain, or any other complaints.      Past Medical History:  Diagnosis Date  . Anemia affecting pregnancy 03/21/2017  . Asthma    years ago, exercise induced  . Headache    with pregnancy  . Heart murmur    history of, resolved 3 months later  . Hyperemesis 2016  . Hypertension   . Infection    UTI  . Pregnancy induced hypertension   . Syncope    in high school, found out had a heart murmur    Patient Active Problem List   Diagnosis Date Noted  . Post-operative state 07/16/2017  . Gestational hypertension 06/25/2017  . Sickle cell trait ([facility])  11/13/2016  . Low vitamin D level 11/13/2016  . Asthma exacerbation, mild 11/05/2016    Past Surgical History:  Procedure Laterality Date  . LAPAROSCOPIC TUBAL LIGATION Bilateral 07/02/2017   Procedure: LAPAROSCOPIC TUBAL LIGATION - Filshie clips;  Surgeon: Hermina Staggers, MD;  Location: [facility];  Service: Gynecology;  Laterality: Bilateral;  . TUBAL LIGATION    . WISDOM TOOTH EXTRACTION       OB History    Gravida  3   Para  2   Term  2   Preterm  0   AB  1   Living  2     SAB  1   TAB  0   Ectopic  0   Multiple  0   Live Births  2           Family History  Problem Relation Age of Onset  . Asthma Mother   . Hypertension Mother   . Miscarriages / [place] Mother   . Healthy Father     Social History   Tobacco Use  . Smoking status: Current Some Day Smoker    Types: Cigarettes  . Smokeless tobacco: Never Used  Substance Use Topics  . Alcohol use: Yes    Comment: occ.  . Drug use: No    Home Medications Prior to Admission medications   Medication Sig Start Date End Date Taking? Authorizing Provider  albuterol (VENTOLIN HFA) 108 (90 Base) MCG/ACT inhaler Inhale 1-2 puffs  into the lungs every 6 (six) hours as needed for shortness of breath. 09/15/19  Yes [provider]  FLUoxetine (PROZAC) 10 MG capsule Take 10 mg by mouth 2 (two) times daily. 02/04/20  Yes [provider]  hydrOXYzine (ATARAX/VISTARIL) 25 MG tablet Take 25 mg by mouth 4 (four) times daily as needed for anxiety. 02/04/20  Yes [provider]  ibuprofen (ADVIL) 100 MG/5ML suspension Take 600 mg by mouth every 4 (four) hours as needed for mild pain.   Yes [provider]  Multiple Vitamin (MULTIVITAMIN WITH MINERALS) TABS tablet Take 1 tablet by mouth daily.   Yes [provider]  zolpidem (AMBIEN) 10 MG tablet Take 5 mg by mouth at bedtime as needed for sleep. Husbands prescription   Yes [provider]  metoCLOPramide (REGLAN) 10  MG tablet Take 1 tablet (10 mg total) by mouth every 6 (six) hours. 02/08/20   Kebra Lowrimore, Helane Gunther, PA-C    Allergies    Patient has no known allergies.  Review of Systems   Review of Systems  Constitutional: Positive for fatigue. Negative for chills, diaphoresis and fever.  Respiratory: Positive for cough. Negative for shortness of breath.   Cardiovascular: Positive for chest pain. Negative for leg swelling.  Gastrointestinal: Positive for nausea. Negative for abdominal pain, diarrhea and vomiting.  Genitourinary: Negative for dysuria, flank pain and frequency.  Musculoskeletal: Positive for back pain. Negative for neck pain.  Neurological: Positive for light-headedness and headaches. Negative for seizures, syncope and weakness.       Tingling all over her body.  All other systems reviewed and are negative.   Physical Exam Updated Vital Signs BP 115/78 (BP Location: Left Arm)   Pulse 73   Temp 98.5 F (36.9 C) (Oral)   Resp 18   Ht 5\' 3"  (1.6 m)   Wt 56.7 kg   LMP 01/29/2020   SpO2 100%   BMI 22.14 kg/m   Physical Exam Vitals and nursing note reviewed.  Constitutional:      General: She is not in acute distress.    Appearance: She is well-developed. She is not diaphoretic.  HENT:     Head: Normocephalic and atraumatic.     Mouth/Throat:     Mouth: Mucous membranes are moist.     Pharynx: Oropharynx is clear.  Eyes:     Conjunctiva/sclera: Conjunctivae normal.  Cardiovascular:     Rate and Rhythm: Normal rate and regular rhythm.     Pulses: Normal pulses.          Radial pulses are 2+ on the right side and 2+ on the left side.       Posterior tibial pulses are 2+ on the right side and 2+ on the left side.     Heart sounds: Normal heart sounds.     Comments: Tactile temperature in the extremities appropriate and equal bilaterally. Pulmonary:     Effort: Pulmonary effort is normal. No respiratory distress.     Breath sounds: Normal breath sounds.  Abdominal:      Palpations: Abdomen is soft.     Tenderness: There is no abdominal tenderness. There is no guarding.  Musculoskeletal:     Cervical back: Neck supple.     Right lower leg: No edema.     Left lower leg: No edema.     Comments: No lower extremity edema, tenderness, color change, or increased warmth.  Lymphadenopathy:     Cervical: No cervical adenopathy.  Skin:  General: Skin is warm and dry.  Neurological:     Mental Status: She is alert and oriented to person, place, and time.     Comments: No noted acute cognitive deficit. Sensation grossly intact to light touch in the extremities.   Grip strengths equal bilaterally.   Strength 5/5 in all extremities.  No gait disturbance.  Coordination intact.  Cranial nerves III-XII grossly intact.  Handles oral secretions without noted difficulty.  No noted phonation or speech deficit. No facial droop.   Psychiatric:        Mood and Affect: Mood and affect normal.        Speech: Speech normal.        Behavior: Behavior normal.     ED Results / Procedures / Treatments   Labs (all labs ordered are listed, but only abnormal results are displayed) Labs Reviewed  CBC - Abnormal; Notable for the following components:      Result Value   WBC 3.0 (*)    All other components within normal limits  BASIC METABOLIC PANEL  I-STAT BETA HCG BLOOD, ED ([facility], [facility], [facility] ONLY)  TROPONIN I (HIGH SENSITIVITY)  TROPONIN I (HIGH SENSITIVITY)    EKG EKG Interpretation  Date/Time:  Monday February 08 2020 10:54:52 EST Ventricular Rate:  75 PR Interval:    QRS Duration: 83 QT Interval:  375 QTC Calculation: 419 R Axis:   48 Text Interpretation: Sinus rhythm Confirmed by Virgina Norfolk [phone]) on 02/08/2020 1:21:52 PM   Radiology DG Chest 2 View  Result Date: 02/08/2020 CLINICAL DATA:  Chest pain.  COVID-19 positive earlier in the year. EXAM: CHEST - 2 VIEW COMPARISON:  01/24/2020 FINDINGS: Numerous leads and wires project over the chest. Midline  trachea. Normal heart size and mediastinal contours. No pleural effusion or pneumothorax. Clear lungs. IMPRESSION: No acute cardiopulmonary disease. Electronically Signed   By: Jeronimo Greaves M.D.   On: 02/08/2020 12:02   CT Head Wo Contrast  Result Date: 02/08/2020 CLINICAL DATA:  Numbness all over EXAM: CT HEAD WITHOUT CONTRAST TECHNIQUE: Contiguous axial images were obtained from the base of the skull through the vertex without intravenous contrast. COMPARISON:  None. FINDINGS: Brain: No evidence of acute infarction, hemorrhage, hydrocephalus, extra-axial collection or mass lesion/mass effect. Vascular: No hyperdense vessel or unexpected calcification. Skull: Normal. Negative for fracture or focal lesion. Sinuses/Orbits: No acute finding. Other: None. IMPRESSION: Normal head CT for age. Electronically Signed   By: Alcide Clever M.D.   On: 02/08/2020 13:54    Procedures Procedures (including critical care time)  Medications Ordered in ED Medications  sodium chloride flush (NS) 0.9 % injection 3 mL (has no administration in time range)  sodium chloride 0.9 % bolus 1,000 mL (0 mLs Intravenous Stopped 02/08/20 1520)  ketorolac (TORADOL) 30 MG/ML injection 15 mg (15 mg Intravenous Given 02/08/20 1322)  dexamethasone (DECADRON) injection 10 mg (10 mg Intravenous Given 02/08/20 1323)  metoCLOPramide (REGLAN) injection 10 mg (10 mg Intravenous Given 02/08/20 1322)    ED Course  I have reviewed the triage vital signs and the nursing notes.  Pertinent labs & imaging results that were available during my care of the patient were reviewed by me and considered in my medical decision making (see chart for details).    MDM Rules/Calculators/A&P                      Patient presents with sensation of tingling all over her body, aching, fatigue, intermittent headaches. Patient is  nontoxic appearing, afebrile, not tachycardic, not tachypneic, not hypotensive, maintains excellent SPO2 on room air, and is in no  apparent distress.  No focal neuro deficits.  Lab work reassuring.  Low suspicion for ACS.  Description of chest discomfort would be atypical for ACS. HEART score of 1, indicating low risk for cardiac event. Troponin negative.  Single troponin obtained due to duration of symptoms.  No findings of acute arrhythmia or ischemia on EKG. Wells score of 0, indicating low risk for PE. PERC negative. No acute abnormalities on chest x-ray or head CT. Patient experienced resolution in her symptoms with interventions here in the ED. Patient can continue to have work-up as an outpatient.  Referrals given. The patient was given instructions for home care as well as return precautions. Patient voices understanding of these instructions, accepts the plan, and is comfortable with discharge.   Vitals:   02/08/20 1315 02/08/20 1330 02/08/20 1348 02/08/20 1415  BP:  116/76 103/62 102/63  Pulse: 65 75 60 (!) 56  Resp: 19 18 15 13   Temp:      TempSrc:      SpO2: 99% 100% 99% 100%  Weight:      Height:         Final Clinical Impression(s) / ED Diagnoses Final diagnoses:  Recurrent headache  Body aches  Numbness    Rx / DC Orders ED Discharge Orders         Ordered    metoCLOPramide (REGLAN) 10 MG tablet  Every 6 hours     02/08/20 1503           02/10/20, PA-C 02/08/20 1536    02/10/20, DO 02/08/20 1718

## 2020-02-09 ENCOUNTER — Other Ambulatory Visit: Payer: Self-pay

## 2020-02-09 ENCOUNTER — Ambulatory Visit: Payer: Self-pay | Admitting: *Deleted

## 2020-02-09 ENCOUNTER — Emergency Department (HOSPITAL_COMMUNITY)
Admission: EM | Admit: 2020-02-09 | Discharge: 2020-02-09 | Disposition: A | Discharge status: Home / Self Care | Payer: Commercial Managed Care - PPO | Attending: Emergency Medicine | Admitting: Emergency Medicine

## 2020-02-09 ENCOUNTER — Encounter (HOSPITAL_COMMUNITY): Payer: Self-pay | Admitting: Emergency Medicine

## 2020-02-09 DIAGNOSIS — R5383 Other fatigue: Secondary | ICD-10-CM | POA: Diagnosis present

## 2020-02-09 DIAGNOSIS — R509 Fever, unspecified: Secondary | ICD-10-CM | POA: Insufficient documentation

## 2020-02-09 DIAGNOSIS — Z79899 Other long term (current) drug therapy: Secondary | ICD-10-CM | POA: Diagnosis not present

## 2020-02-09 DIAGNOSIS — J45909 Unspecified asthma, uncomplicated: Secondary | ICD-10-CM | POA: Insufficient documentation

## 2020-02-09 DIAGNOSIS — I10 Essential (primary) hypertension: Secondary | ICD-10-CM | POA: Diagnosis not present

## 2020-02-09 DIAGNOSIS — R0789 Other chest pain: Secondary | ICD-10-CM | POA: Insufficient documentation

## 2020-02-09 DIAGNOSIS — F17210 Nicotine dependence, cigarettes, uncomplicated: Secondary | ICD-10-CM | POA: Insufficient documentation

## 2020-02-09 NOTE — ED Triage Notes (Signed)
Pt reports was here yesterday. States that not feeling any better today, very tired/fatigued, had fever twice and chest pains are worse today.  Took Ibuprofen at 2 pm for fever

## 2020-02-09 NOTE — ED Provider Notes (Signed)
[facility]-EMERGENCY DEPT Provider Note   CSN: [id number] Arrival date & time: 02/09/20  1819     History Chief Complaint  Patient presents with  . Fatigue  . Fever  . Chest Pain    Stacey Reid is a 24 y.o. female with a history of anxiety, depression, hypertension, headaches currently on SSRI and hydroxyzine  HPI  Patient is a 24 year old female with medical history significant for anxiety, depression, hypertension, headaches presenting today for fever, congestion, body aches, intermittent left-sided numbness of the face, upper and lower extremity, and occasional bilateral foot numbness.  Patient states that she was seen for the symptoms yesterday and states that she continued to have them during her discharge yesterday.  She states that she knows she had a fever today of 102 and took ibuprofen which resolved the fever however she presented to ED again for reevaluation as this was a new symptom.  She denies any vomiting, diarrhea, shortness of breath, syncope, near syncope lightheadedness, dizziness, vision changes, altered mental status, confusion, focal weakness.  Denies any chest palpitations.  Patient states that she also has chest pain, abdominal pain, a moment of nausea.      Past Medical History:  Diagnosis Date  . Anemia affecting pregnancy 03/21/2017  . Asthma    years ago, exercise induced  . Headache    with pregnancy  . Heart murmur    history of, resolved 3 months later  . Hyperemesis 2016  . Hypertension   . Infection    UTI  . Pregnancy induced hypertension   . Syncope    in high school, found out had a heart murmur    Patient Active Problem List   Diagnosis Date Noted  . Post-operative state 07/16/2017  . Gestational hypertension 06/25/2017  . Sickle cell trait (HCC) 11/13/2016  . Low vitamin D level 11/13/2016  . Asthma exacerbation, mild 11/05/2016    Past Surgical History:  Procedure Laterality Date  .  LAPAROSCOPIC TUBAL LIGATION Bilateral 07/02/2017   Procedure: LAPAROSCOPIC TUBAL LIGATION - Filshie clips;  Surgeon: Hermina Staggers, MD;  Location: [facility];  Service: Gynecology;  Laterality: Bilateral;  . TUBAL LIGATION    . WISDOM TOOTH EXTRACTION       OB History    Gravida  3   Para  2   Term  2   Preterm  0   AB  1   Living  2     SAB  1   TAB  0   Ectopic  0   Multiple  0   Live Births  2           Family History  Problem Relation Age of Onset  . Asthma Mother   . Hypertension Mother   . Miscarriages / [place] Mother   . Healthy Father     Social History   Tobacco Use  . Smoking status: Current Some Day Smoker    Types: Cigarettes  . Smokeless tobacco: Never Used  Substance Use Topics  . Alcohol use: Yes    Comment: occ.  . Drug use: No    Home Medications Prior to Admission medications   Medication Sig Start Date End Date Taking? Authorizing Provider  albuterol (VENTOLIN HFA) 108 (90 Base) MCG/ACT inhaler Inhale 1-2 puffs into the lungs every 6 (six) hours as needed for shortness of breath. 09/15/19  Yes [provider]  FLUoxetine (PROZAC) 10 MG capsule Take 10 mg by mouth 2 (two) times  daily. 02/04/20  Yes [provider]  hydrOXYzine (ATARAX/VISTARIL) 25 MG tablet Take 25 mg by mouth 4 (four) times daily as needed for anxiety. 02/04/20  Yes [provider]  ibuprofen (ADVIL) 100 MG/5ML suspension Take 600 mg by mouth every 4 (four) hours as needed for mild pain.   Yes [provider]  metoCLOPramide (REGLAN) 10 MG tablet Take 1 tablet (10 mg total) by mouth every 6 (six) hours. 02/08/20  Yes Joy, Shawn C, PA-C  Multiple Vitamin (MULTIVITAMIN WITH MINERALS) TABS tablet Take 1 tablet by mouth daily.   Yes [provider]    Allergies    Patient has no known allergies.  Review of Systems   Review of Systems  Constitutional: Positive for fatigue and fever. Negative for chills and diaphoresis.    HENT: Positive for congestion. Negative for sore throat.   Respiratory: Negative for cough, chest tightness, shortness of breath, wheezing and stridor.   Cardiovascular: Positive for chest pain.  Gastrointestinal: Positive for abdominal pain and diarrhea. Negative for abdominal distention.  Genitourinary: Negative for dysuria and frequency.  Musculoskeletal: Positive for arthralgias and myalgias.       Diffuse arthralgias and myalgias  Skin: Negative for rash.  Neurological: Positive for numbness (Decrease sensation) and headaches. Negative for dizziness and facial asymmetry.    Physical Exam Updated Vital Signs BP 125/79 (BP Location: Left Arm)   Pulse 70   Temp 98.4 F (36.9 C) (Oral)   Resp 18   LMP 01/29/2020   SpO2 99%   Physical Exam Vitals and nursing note reviewed.  Constitutional:      General: She is not in acute distress.    Appearance: She is not ill-appearing, toxic-appearing or diaphoretic.     Comments: Patient is 24 year old female who appears stated age in no acute distress sitting comfortably in bed, pleasant, able to answer questions appropriately and follow commands.  He is nonill appearing, no toxic appearing, nondiaphoretic appears normal weight normal in appearance.  HENT:     Head: Normocephalic and atraumatic.     Nose: Nose normal.     Mouth/Throat:     Mouth: Mucous membranes are moist.  Eyes:     General: No scleral icterus.    Extraocular Movements: Extraocular movements intact.     Pupils: Pupils are equal, round, and reactive to light.  Neck:     Comments: No neck rigidity  Cardiovascular:     Rate and Rhythm: Normal rate and regular rhythm.     Pulses: Normal pulses.     Heart sounds: Normal heart sounds.  Pulmonary:     Effort: Pulmonary effort is normal. No respiratory distress.     Breath sounds: No wheezing.  Abdominal:     Palpations: Abdomen is soft.     Tenderness: There is no abdominal tenderness. There is no right CVA  tenderness, left CVA tenderness, guarding or rebound.  Musculoskeletal:     Cervical back: Normal range of motion.     Right lower leg: No edema.     Left lower leg: No edema.     Comments: Strength 5/5 and symmetric in bilateral grip, wrist flexion extension, elbow flexion-extension, shoulder flexion extension abduction abduction.  Bilateral lower extremities with 5/5 and symmetric plantar and dorsiflexion, hip flexion extension knee flexion-extension.  No tenderness to palpation of any joints or long bones.  No swelling of joints noted  Skin:    General: Skin is warm and dry.     Capillary Refill:  Capillary refill takes less than 2 seconds. Cap refill within normal limits    Comments: No rash, erythema, no areas of tenderness or bruising.  Neurological:     Mental Status: She is alert. Mental status is at baseline.     Comments: Cranial nerves intact.  Sensation intact, strength intact as noted MSK.  Sensation subjectively decreased in right hand and left foot however she is able to differentiate sharp and dull.    Psychiatric:        Mood and Affect: Mood normal.        Behavior: Behavior normal.     Comments: Stable anxious but easily redirected     ED Results / Procedures / Treatments   Labs (all labs ordered are listed, but only abnormal results are displayed) Labs Reviewed - No data to display  EKG EKG Interpretation  Date/Time:  Tuesday February 09 2020 18:37:28 EST Ventricular Rate:  73 PR Interval:    QRS Duration: 81 QT Interval:  402 QTC Calculation: 443 R Axis:   56 Text Interpretation: Sinus rhythm Confirmed by Tilden Fossa [phone]) on 02/09/2020 7:36:29 PM   Radiology DG Chest 2 View  Result Date: 02/08/2020 CLINICAL DATA:  Chest pain.  COVID-19 positive earlier in the year. EXAM: CHEST - 2 VIEW COMPARISON:  01/24/2020 FINDINGS: Numerous leads and wires project over the chest. Midline trachea. Normal heart size and mediastinal contours. No pleural effusion  or pneumothorax. Clear lungs. IMPRESSION: No acute cardiopulmonary disease. Electronically Signed   By: Jeronimo Greaves M.D.   On: 02/08/2020 12:02   CT Head Wo Contrast  Result Date: 02/08/2020 CLINICAL DATA:  Numbness all over EXAM: CT HEAD WITHOUT CONTRAST TECHNIQUE: Contiguous axial images were obtained from the base of the skull through the vertex without intravenous contrast. COMPARISON:  None. FINDINGS: Brain: No evidence of acute infarction, hemorrhage, hydrocephalus, extra-axial collection or mass lesion/mass effect. Vascular: No hyperdense vessel or unexpected calcification. Skull: Normal. Negative for fracture or focal lesion. Sinuses/Orbits: No acute finding. Other: None. IMPRESSION: Normal head CT for age. Electronically Signed   By: Alcide Clever M.D.   On: 02/08/2020 13:54    Procedures Procedures (including critical care time)  Medications Ordered in ED Medications - No data to display  ED Course  I have reviewed the triage vital signs and the nursing notes.  Pertinent labs & imaging results that were available during my care of the patient were reviewed by me and considered in my medical decision making (see chart for details).    MDM Rules/Calculators/A&P                     Patient is healthy 24 year old female with no significant past medical history other than anxiety, depression, headaches presents today with symptoms mentioned in HPI.  Tested positive for Covid approximately 1 month ago of symptoms resolved approximately 3 weeks ago.  Since that time she has had intermittent chest pain and had several work-ups including troponins, basic labs, x-rays, EKGs and a CT PA to rule out pulmonary embolisms.  All of these testing modalities have shown no acute abnormalities.  Patient presents today with primary concern of having a temperature of 102 earlier today prior to arrival.  She states that she has already talked to neurology and has an appointment in March on the 20th to be  seen as an outpatient.  She states that she was concerned because her fever is a new symptom she is currently afebrile, not tachycardic, not  tachypneic, not hypoxic with unremarkable neurologic exam and no abnormalities on auscultation of her heart and lungs.   I personally reviewed all lab work done prior visits CT PE specifically showed no thoracic abnormality.  As her symptoms are unchanged today from prior apart from a complaint of fever which resolved with ibuprofen x1 dose I suspect the patient has initially a viral illness yesterday reason for her non-intractable fever.  Doubt that she has meningitis/encephalitis really intracranial infection causing her symptoms.  She has no meningismus and no altered mental status and no neurologic findings on exam.  I discussed this case with my attending physician who cosigned this note including patient's presenting symptoms, physical exam, and planned diagnostics and interventions. Attending physician stated agreement with plan or made changes to plan which were implemented.   EKG is nonischemic, independently reviewed the EKG myself and discussed with my attending Dr. Madilyn Hook.  Vital signs are within normal limits at time of discharge.  She is well-appearing in no acute distress.  She will follow up with her neurology appointment next month.  Shared decision making conversation with patient we agreed to hold off on additional testing as she has had extensive testing at this time.  The addition of the fever to her other symptoms does not merit obtaining repeat lab work.   JONDA ALANIS was evaluated in Emergency Department on 02/09/2020 for the symptoms described in the history of present illness. She was evaluated in the context of the global COVID-19 pandemic, which necessitated consideration that the patient might be at risk for infection with the SARS-CoV-2 virus that causes COVID-19. Institutional protocols and algorithms that pertain to the evaluation  of patients at risk for COVID-19 are in a state of rapid change based on information released by regulatory bodies including the CDC and federal and state organizations. These policies and algorithms were followed during the patient's care in the ED.  The medical records were personally reviewed by myself. I personally reviewed all lab results and interpreted all imaging studies and either concurred with their official read or contacted radiology for clarification.   This patient appears reasonably screened and I doubt any other medical condition requiring further workup, evaluation, or treatment in the ED at this time prior to discharge.   Patient's vitals are WNL apart from vital sign abnormalities discussed above, patient is in NAD, and able to ambulate in the ED at their baseline and able to tolerate PO.  Pain has been managed or a plan has been made for home management and has no complaints prior to discharge. Patient is comfortable with above plan and for discharge at this time. All questions were answered prior to disposition. Results from the ER workup discussed with the patient face to face and all questions answered to the best of my ability. The patient is safe for discharge with strict return precautions. Patient appears safe for discharge with appropriate follow-up. Conveyed my impression with the patient and they voiced understanding and are agreeable to plan.   An After Visit Summary was printed and given to the patient.  Portions of this note were generated with Scientist, clinical (histocompatibility and immunogenetics). Dictation errors may occur despite best attempts at proofreading.    Final Clinical Impression(s) / ED Diagnoses Final diagnoses:  Fever, unspecified fever cause  Fatigue, unspecified type    Rx / DC Orders ED Discharge Orders    None       Gailen Shelter, [place] 02/09/20 2101    Tilden Fossa,  MD 02/10/20 0017

## 2020-02-09 NOTE — ED Notes (Signed)
Observed patient using cell phone.

## 2020-02-09 NOTE — Telephone Encounter (Addendum)
Pt called with feeling bad yesterday. She went to the ED, and was diagnosed with silent migraines and also she has a low blood count. She feels excessively tired She is now has a fever of 102 and taken ibuprofen for it and does not feel like it helps. She was told to follow up with a neurologist. She stated she called 2 offices and was told that she could not get an appointment until April. Advised her to contact he provider and and get a referral to a neurologist. She is on her way to [facility] ED.

## 2020-02-09 NOTE — Discharge Instructions (Signed)
Please keep your neurology appointment in late March.  Please return to ED if you have any new or concerning symptoms that we have discussed such as slurred speech, focal weakness of arm or leg, or any other new or concerning symptoms.  Please use Tylenol and ibuprofen for your fever.  You may also follow-up with your primary care doctor in the meantime for reassessment.

## 2020-02-10 ENCOUNTER — Other Ambulatory Visit: Payer: Self-pay

## 2020-02-10 ENCOUNTER — Encounter (HOSPITAL_COMMUNITY): Payer: Self-pay

## 2020-02-10 ENCOUNTER — Emergency Department (HOSPITAL_COMMUNITY)
Admission: EM | Admit: 2020-02-10 | Discharge: 2020-02-10 | Disposition: A | Discharge status: Home / Self Care | Payer: Commercial Managed Care - PPO | Attending: Emergency Medicine | Admitting: Emergency Medicine

## 2020-02-10 ENCOUNTER — Emergency Department (HOSPITAL_BASED_OUTPATIENT_CLINIC_OR_DEPARTMENT_OTHER): Payer: Commercial Managed Care - PPO

## 2020-02-10 DIAGNOSIS — Z79899 Other long term (current) drug therapy: Secondary | ICD-10-CM | POA: Insufficient documentation

## 2020-02-10 DIAGNOSIS — R202 Paresthesia of skin: Secondary | ICD-10-CM | POA: Diagnosis not present

## 2020-02-10 DIAGNOSIS — R0602 Shortness of breath: Secondary | ICD-10-CM | POA: Insufficient documentation

## 2020-02-10 DIAGNOSIS — M79605 Pain in left leg: Secondary | ICD-10-CM | POA: Diagnosis present

## 2020-02-10 DIAGNOSIS — I10 Essential (primary) hypertension: Secondary | ICD-10-CM | POA: Insufficient documentation

## 2020-02-10 DIAGNOSIS — J45909 Unspecified asthma, uncomplicated: Secondary | ICD-10-CM | POA: Diagnosis not present

## 2020-02-10 DIAGNOSIS — F17210 Nicotine dependence, cigarettes, uncomplicated: Secondary | ICD-10-CM | POA: Diagnosis not present

## 2020-02-10 DIAGNOSIS — M79609 Pain in unspecified limb: Secondary | ICD-10-CM | POA: Diagnosis not present

## 2020-02-10 NOTE — Discharge Instructions (Addendum)
You were seen in the emergency department for continued shortness of breath and numbness and tingling over your face arms and legs.  You had a new complaint of left leg pain.  Your had a ultrasound of your leg that did not show any evidence of a blood clot.  It would be important for you to continue to work with your primary care doctor and neurology for work-up of your other symptoms.

## 2020-02-10 NOTE — Progress Notes (Signed)
Left lower extremity venous duplex completed. Refer to "CV Proc" under chart review to view preliminary results.  02/10/2020 6:41 PM Eula Fried., MHA, RVT, RDCS, RDMS

## 2020-02-10 NOTE — ED Provider Notes (Signed)
[name] EMERGENCY DEPARTMENT Provider Note   CSN: [id number] Arrival date & time: 02/10/20  1640     History Chief Complaint  Patient presents with  . Numbness    Stacey Reid is a 24 y.o. female.  She was diagnosed with Covid over a month ago.  She has been in the ED multiple times for various symptoms since then.  Most recently she was here 2 days ago for numbness of her face upper and lower extremities and legs along with some chest discomfort.  She had lab work-up and a CT of her head that were negative.  She was referred on to neurology but has not seen them yet.  She is complaining of worsening left leg.Discomfort.  No trauma.  Patient is concerned for possible blood clot.  The history is provided by the patient.  Leg Pain Location:  Leg Leg location:  L lower leg Pain details:    Quality:  Aching   Severity:  Moderate   Onset quality:  Sudden   Timing:  Constant   Progression:  Unchanged Chronicity:  New Dislocation: no   Prior injury to area:  No Relieved by:  None tried Worsened by:  Nothing Ineffective treatments:  None tried Associated symptoms: fatigue, numbness and tingling   Associated symptoms: no back pain and no fever   Risk factors: recent illness        Past Medical History:  Diagnosis Date  . Anemia affecting pregnancy 03/21/2017  . Asthma    years ago, exercise induced  . Headache    with pregnancy  . Heart murmur    history of, resolved 3 months later  . Hyperemesis 2016  . Hypertension   . Infection    UTI  . Pregnancy induced hypertension   . Syncope    in high school, found out had a heart murmur    Patient Active Problem List   Diagnosis Date Noted  . Post-operative state 07/16/2017  . Gestational hypertension 06/25/2017  . Sickle cell trait (HCC) 11/13/2016  . Low vitamin D level 11/13/2016  . Asthma exacerbation, mild 11/05/2016    Past Surgical History:  Procedure Laterality Date  .  LAPAROSCOPIC TUBAL LIGATION Bilateral 07/02/2017   Procedure: LAPAROSCOPIC TUBAL LIGATION - Filshie clips;  Surgeon: Hermina Staggers, MD;  Location: [facility];  Service: Gynecology;  Laterality: Bilateral;  . TUBAL LIGATION    . WISDOM TOOTH EXTRACTION       OB History    Gravida  3   Para  2   Term  2   Preterm  0   AB  1   Living  2     SAB  1   TAB  0   Ectopic  0   Multiple  0   Live Births  2           Family History  Problem Relation Age of Onset  . Asthma Mother   . Hypertension Mother   . Miscarriages / [place] Mother   . Healthy Father     Social History   Tobacco Use  . Smoking status: Current Some Day Smoker    Types: Cigarettes  . Smokeless tobacco: Never Used  Substance Use Topics  . Alcohol use: Yes    Comment: occ.  . Drug use: No    Home Medications Prior to Admission medications   Medication Sig Start Date End Date Taking? Authorizing Provider  albuterol (VENTOLIN HFA) 108 (90 Base)  MCG/ACT inhaler Inhale 1-2 puffs into the lungs every 6 (six) hours as needed for shortness of breath. 09/15/19   [provider]  FLUoxetine (PROZAC) 10 MG capsule Take 10 mg by mouth 2 (two) times daily. 02/04/20   [provider]  hydrOXYzine (ATARAX/VISTARIL) 25 MG tablet Take 25 mg by mouth 4 (four) times daily as needed for anxiety. 02/04/20   [provider]  ibuprofen (ADVIL) 100 MG/5ML suspension Take 600 mg by mouth every 4 (four) hours as needed for mild pain.    [provider]  metoCLOPramide (REGLAN) 10 MG tablet Take 1 tablet (10 mg total) by mouth every 6 (six) hours. 02/08/20   Joy, Shawn C, PA-C  Multiple Vitamin (MULTIVITAMIN WITH MINERALS) TABS tablet Take 1 tablet by mouth daily.    [provider]    Allergies    Patient has no known allergies.  Review of Systems   Review of Systems  Constitutional: Positive for fatigue. Negative for fever.  HENT: Negative for sore throat.   Eyes:  Negative for visual disturbance.  Respiratory: Positive for shortness of breath.   Cardiovascular: Negative for chest pain.  Gastrointestinal: Negative for abdominal pain.  Genitourinary: Negative for dysuria.  Musculoskeletal: Negative for back pain.  Skin: Negative for rash.  Neurological: Negative for weakness.    Physical Exam Updated Vital Signs BP 124/83   Pulse (!) 59   Temp 98.7 F (37.1 C) (Oral)   Resp 18   LMP 01/29/2020   SpO2 98%   Physical Exam Vitals and nursing note reviewed.  Constitutional:      General: She is not in acute distress.    Appearance: She is well-developed.  HENT:     Head: Normocephalic and atraumatic.  Eyes:     Conjunctiva/sclera: Conjunctivae normal.  Cardiovascular:     Rate and Rhythm: Normal rate and regular rhythm.     Heart sounds: No murmur.  Pulmonary:     Effort: Pulmonary effort is normal. No respiratory distress.     Breath sounds: Normal breath sounds.  Abdominal:     Palpations: Abdomen is soft.     Tenderness: There is no abdominal tenderness.  Musculoskeletal:        General: No swelling. Normal range of motion.     Cervical back: Neck supple.  Skin:    General: Skin is warm and dry.     Capillary Refill: Capillary refill takes less than 2 seconds.  Neurological:     General: No focal deficit present.     Mental Status: She is alert.     ED Results / Procedures / Treatments   Labs (all labs ordered are listed, but only abnormal results are displayed) Labs Reviewed - No data to display  EKG EKG Interpretation  Date/Time:  Wednesday February 10 2020 20:01:22 EST Ventricular Rate:  72 PR Interval:  128 QRS Duration: 76 QT Interval:  368 QTC Calculation: 402 R Axis:   33 Text Interpretation: Normal sinus rhythm Nonspecific ST and T wave abnormality Abnormal ECG No significant change since yesterday Confirmed by Aletta Edouard [phone]) on 02/10/2020 9:34:35 PM   Radiology VAS [place] LOWER EXTREMITY VENOUS  (DVT) (ONLY [facility] & [facility])  Result Date: 02/10/2020  Lower Venous DVTStudy Indications: Pain, and numbness and tingling.  Comparison Study: No prior study Performing Technologist: Maudry Mayhew MHA, RDMS, RVT, RDCS  Examination Guidelines: A complete evaluation includes B-mode imaging, spectral Doppler, color Doppler, and power Doppler as needed of all accessible portions  of each vessel. Bilateral testing is considered an integral part of a complete examination. Limited examinations for reoccurring indications may be performed as noted. The reflux portion of the exam is performed with the patient in reverse Trendelenburg.  +---------+---------------+---------+-----------+----------+--------------+ LEFT     CompressibilityPhasicitySpontaneityPropertiesThrombus Aging +---------+---------------+---------+-----------+----------+--------------+ CFV      Full           Yes      Yes                                 +---------+---------------+---------+-----------+----------+--------------+ SFJ      Full                                                        +---------+---------------+---------+-----------+----------+--------------+ FV Prox  Full                                                        +---------+---------------+---------+-----------+----------+--------------+ FV Mid   Full                                                        +---------+---------------+---------+-----------+----------+--------------+ FV DistalFull                                                        +---------+---------------+---------+-----------+----------+--------------+ PFV      Full                                                        +---------+---------------+---------+-----------+----------+--------------+ POP      Full           Yes      Yes                                 +---------+---------------+---------+-----------+----------+--------------+ PTV      Full                                                         +---------+---------------+---------+-----------+----------+--------------+ PERO     Full                                                        +---------+---------------+---------+-----------+----------+--------------+     Summary: LEFT: -  There is no evidence of deep vein thrombosis in the lower extremity.  - No cystic structure found in the popliteal fossa. - For completeness, the left posterior tibial artery was briefly evaluated and found to be patent with triphasic flow.  *See table(s) above for measurements and observations.    Preliminary     Procedures Procedures (including critical care time)  Medications Ordered in ED Medications - No data to display  ED Course  I have reviewed the triage vital signs and the nursing notes.  Pertinent labs & imaging results that were available during my care of the patient were reviewed by me and considered in my medical decision making (see chart for details).    MDM Rules/Calculators/A&P                     24 year old female here with left calf pain along with some more longstanding numbness tingling face arms legs heaviness in her arms and legs shortness of breath chest pressure.  She has had multiple visits with lab tests CT head CT chest.  Currently afebrile satting 98 to 100% on room air with normal blood pressure no tachypnea.  Very well-appearing.  Possibly related to some chronic Covid symptoms.  Possible anxiety.  Just had lab work 2 days ago that was unremarkable so do not think she needs to have these repeated.  Duplex negative here for any acute DVT.  Has already had a referral for neurology placed so recommended that she continue to follow-up with out with her PCP.  Final Clinical Impression(s) / ED Diagnoses Final diagnoses:  Left leg pain  Paresthesia  SOB (shortness of breath)    Rx / DC Orders ED Discharge Orders    None       Terrilee Files, MD 02/11/20  1145

## 2020-02-10 NOTE — ED Triage Notes (Addendum)
Pt presents with BLE numbness and tingling x1 week, seen at [facility] for the same. Reports increase pain to Left calf associated with some SOB. Pt states she tested positive for covid 01/03/2020, she's concerned for blood clots. Pt hs recently been seen at [facility] for the same. Pt states she had a negative covid test 01/13/2020

## 2020-02-12 ENCOUNTER — Encounter (HOSPITAL_COMMUNITY): Payer: Self-pay | Admitting: Emergency Medicine

## 2020-02-12 ENCOUNTER — Emergency Department (HOSPITAL_COMMUNITY)
Admission: EM | Admit: 2020-02-12 | Discharge: 2020-02-13 | Disposition: A | Discharge status: Home / Self Care | Payer: Commercial Managed Care - PPO | Attending: Emergency Medicine | Admitting: Emergency Medicine

## 2020-02-12 ENCOUNTER — Other Ambulatory Visit: Payer: Self-pay

## 2020-02-12 ENCOUNTER — Emergency Department (HOSPITAL_COMMUNITY): Payer: Commercial Managed Care - PPO

## 2020-02-12 DIAGNOSIS — R202 Paresthesia of skin: Secondary | ICD-10-CM | POA: Diagnosis not present

## 2020-02-12 DIAGNOSIS — I10 Essential (primary) hypertension: Secondary | ICD-10-CM | POA: Diagnosis not present

## 2020-02-12 DIAGNOSIS — Z79899 Other long term (current) drug therapy: Secondary | ICD-10-CM | POA: Diagnosis not present

## 2020-02-12 DIAGNOSIS — J45909 Unspecified asthma, uncomplicated: Secondary | ICD-10-CM | POA: Insufficient documentation

## 2020-02-12 DIAGNOSIS — R0789 Other chest pain: Secondary | ICD-10-CM

## 2020-02-12 DIAGNOSIS — D573 Sickle-cell trait: Secondary | ICD-10-CM | POA: Insufficient documentation

## 2020-02-12 DIAGNOSIS — F17210 Nicotine dependence, cigarettes, uncomplicated: Secondary | ICD-10-CM | POA: Insufficient documentation

## 2020-02-12 LAB — I-STAT BETA HCG BLOOD, ED (MC, WL, AP ONLY): I-stat hCG, quantitative: <5 m[IU]/mL (ref <5)

## 2020-02-12 LAB — BASIC METABOLIC PANEL
Anion gap: 10 (ref 5–15)
BUN: 10 mg/dL (ref 6–20)
CO2: 22 mmol/L (ref 22–32)
Calcium: 9.1 mg/dL (ref 8.9–10.3)
Chloride: 107 mmol/L (ref 98–111)
Creatinine, Ser: 0.65 mg/dL (ref 0.44–1.00)
GFR calc Af Amer: >60 mL/min (ref >60)
GFR calc non Af Amer: >60 mL/min (ref >60)
Glucose, Bld: 99 mg/dL (ref 70–99)
Potassium: 3.4 mmol/L — ABNORMAL LOW (ref 3.5–5.1)
Sodium: 139 mmol/L (ref 135–145)

## 2020-02-12 LAB — CBC
HCT: 37.7 % (ref 36.0–46.0)
Hemoglobin: 12.8 g/dL (ref 12.0–15.0)
MCH: 30.8 pg (ref 26.0–34.0)
MCHC: 34 g/dL (ref 30.0–36.0)
MCV: 90.8 fL (ref 80.0–100.0)
Platelets: 368 10*3/uL (ref 150–400)
RBC: 4.15 MIL/uL (ref 3.87–5.11)
RDW: 11.4 % — ABNORMAL LOW (ref 11.5–15.5)
WBC: 4.6 10*3/uL (ref 4.0–10.5)
nRBC: 0 % (ref 0.0–0.2)

## 2020-02-12 LAB — TROPONIN I (HIGH SENSITIVITY): Troponin I (High Sensitivity): <2 ng/L (ref <18)

## 2020-02-12 MED ORDER — SODIUM CHLORIDE 0.9% FLUSH
3.0000 mL | Freq: Once | INTRAVENOUS | Status: AC
  Administered 2020-02-12: 3 mL via INTRAVENOUS

## 2020-02-12 NOTE — ED Provider Notes (Signed)
[facility]-EMERGENCY DEPT Provider Note   CSN: [id number] Arrival date & time: 02/12/20  2137     History Chief Complaint  Patient presents with  . Numbness    Stacey Reid is a 24 y.o. female.  Patient with history of asthma, recent dx COVID (01/08/20) presents with symptoms of chest tightness and right arm and leg numbness and weakness. No fever, cough, SOB. She reports one episode of emesis earlier today without ongoing nausea. Patient has been seen/evaluated in the ED for same symptoms 2/15, 2/16, and 2/17. She has been referred for outpatient neurologic evaluation and reports this has been scheduled for Monday next week. She went to lie down tonight and felt her symptoms were worse than before, and she could lift her right arm off the bed, prompting a return to the ED. No headache, falls, difficulty walking or speaking, visual changes.   The history is provided by the patient. No language interpreter was used.       Past Medical History:  Diagnosis Date  . Anemia affecting pregnancy 03/21/2017  . Asthma    years ago, exercise induced  . Headache    with pregnancy  . Heart murmur    history of, resolved 3 months later  . Hyperemesis 2016  . Hypertension   . Infection    UTI  . Pregnancy induced hypertension   . Syncope    in high school, found out had a heart murmur    Patient Active Problem List   Diagnosis Date Noted  . Post-operative state 07/16/2017  . Gestational hypertension 06/25/2017  . Sickle cell trait (HCC) 11/13/2016  . Low vitamin D level 11/13/2016  . Asthma exacerbation, mild 11/05/2016    Past Surgical History:  Procedure Laterality Date  . LAPAROSCOPIC TUBAL LIGATION Bilateral 07/02/2017   Procedure: LAPAROSCOPIC TUBAL LIGATION - Filshie clips;  Surgeon: Hermina Staggers, MD;  Location: [facility];  Service: Gynecology;  Laterality: Bilateral;  . TUBAL LIGATION    . WISDOM TOOTH EXTRACTION       OB History    Gravida  3   Para  2   Term  2   Preterm  0   AB  1   Living  2     SAB  1   TAB  0   Ectopic  0   Multiple  0   Live Births  2           Family History  Problem Relation Age of Onset  . Asthma Mother   . Hypertension Mother   . Miscarriages / [place] Mother   . Healthy Father     Social History   Tobacco Use  . Smoking status: Current Some Day Smoker    Types: Cigarettes  . Smokeless tobacco: Never Used  Substance Use Topics  . Alcohol use: Yes    Comment: occ.  . Drug use: No    Home Medications Prior to Admission medications   Medication Sig Start Date End Date Taking? Authorizing Provider  albuterol (VENTOLIN HFA) 108 (90 Base) MCG/ACT inhaler Inhale 1-2 puffs into the lungs every 6 (six) hours as needed for shortness of breath. 09/15/19   [provider]  FLUoxetine (PROZAC) 10 MG capsule Take 10 mg by mouth 2 (two) times daily. 02/04/20   [provider]  hydrOXYzine (ATARAX/VISTARIL) 25 MG tablet Take 25 mg by mouth 4 (four) times daily as needed for anxiety. 02/04/20   [provider]  ibuprofen (  ADVIL) 100 MG/5ML suspension Take 600 mg by mouth every 4 (four) hours as needed for mild pain.    [provider]  metoCLOPramide (REGLAN) 10 MG tablet Take 1 tablet (10 mg total) by mouth every 6 (six) hours. 02/08/20   Joy, Shawn C, PA-C  Multiple Vitamin (MULTIVITAMIN WITH MINERALS) TABS tablet Take 1 tablet by mouth daily.    [provider]    Allergies    Patient has no known allergies.  Review of Systems   Review of Systems  Constitutional: Negative for chills and fever.  HENT: Negative.   Respiratory: Positive for chest tightness. Negative for cough and shortness of breath.   Cardiovascular: Negative.   Gastrointestinal: Positive for vomiting.  Musculoskeletal: Negative.  Negative for gait problem.  Skin: Negative.   Neurological: Positive for weakness and numbness. Negative for speech  difficulty and headaches.    Physical Exam Updated Vital Signs BP 122/81   Pulse (!) 59   Temp 98 F (36.7 C) (Oral)   Resp 15   Ht 5\' 3"  (1.6 m)   Wt 55.3 kg   LMP 01/29/2020   SpO2 98%   BMI 21.61 kg/m   Physical Exam Vitals and nursing note reviewed.  Constitutional:      General: She is not in acute distress.    Appearance: She is well-developed.  HENT:     Head: Normocephalic.  Cardiovascular:     Rate and Rhythm: Normal rate and regular rhythm.     Heart sounds: No murmur.  Pulmonary:     Effort: Pulmonary effort is normal.     Breath sounds: Normal breath sounds. No wheezing, rhonchi or rales.  Abdominal:     General: Bowel sounds are normal.     Palpations: Abdomen is soft.     Tenderness: There is no abdominal tenderness. There is no guarding or rebound.  Musculoskeletal:        General: Normal range of motion.     Cervical back: Normal range of motion and neck supple.     Comments: Patient moving all extremities, specifically, has voluntary movement of right UE.   Skin:    General: Skin is warm and dry.     Findings: No rash.  Neurological:     Mental Status: She is alert and oriented to person, place, and time.     Comments: CN's 3-12 grossly intact. Speech is clear and focused. No facial asymmetry. No lateralizing weakness. She reports decreased sensation of right upper and lower extremity to light touch. No deficits of coordination. Ambulatory without imbalance.       ED Results / Procedures / Treatments   Labs (all labs ordered are listed, but only abnormal results are displayed) Labs Reviewed  BASIC METABOLIC PANEL - Abnormal; Notable for the following components:      Result Value   Potassium 3.4 (*)    All other components within normal limits  CBC - Abnormal; Notable for the following components:   RDW 11.4 (*)    All other components within normal limits  I-STAT BETA HCG BLOOD, ED ([facility], [facility], [facility] ONLY)  TROPONIN I (HIGH SENSITIVITY)    Results for orders placed or performed during the hospital encounter of 82/99/37  Basic metabolic panel  Result Value Ref Range   Sodium 139 135 - 145 mmol/L   Potassium 3.4 (L) 3.5 - 5.1 mmol/L   Chloride 107 98 - 111 mmol/L   CO2 22 22 - 32 mmol/L   Glucose,  Bld 99 70 - 99 mg/dL   BUN 10 6 - 20 mg/dL   Creatinine, Ser 8.75 0.44 - 1.00 mg/dL   Calcium 9.1 8.9 - 64.3 mg/dL   GFR calc non Af Amer >60 >60 mL/min   GFR calc Af Amer >60 >60 mL/min   Anion gap 10 5 - 15  CBC  Result Value Ref Range   WBC 4.6 4.0 - 10.5 K/uL   RBC 4.15 3.87 - 5.11 MIL/uL   Hemoglobin 12.8 12.0 - 15.0 g/dL   HCT 32.9 51.8 - 84.1 %   MCV 90.8 80.0 - 100.0 fL   MCH 30.8 26.0 - 34.0 pg   MCHC 34.0 30.0 - 36.0 g/dL   RDW 66.0 (L) 63.0 - 16.0 %   Platelets 368 150 - 400 K/uL   nRBC 0.0 0.0 - 0.2 %  I-Stat beta hCG blood, ED  Result Value Ref Range   I-stat hCG, quantitative <5.0 <5 mIU/mL   Comment 3          Troponin I (High Sensitivity)  Result Value Ref Range   Troponin I (High Sensitivity) <2.0 <18 ng/L     EKG None  Radiology DG Chest 2 View  Result Date: 02/12/2020 CLINICAL DATA:  24 year old female with chest and arm numbness, heaviness. EXAM: CHEST - 2 VIEW COMPARISON:  Chest radiographs 02/08/2020 and earlier. FINDINGS: Lung volumes and mediastinal contours remain normal. Visualized tracheal air column is within normal limits. Lungs appear stable and clear. Negative visible bowel gas and osseous structures. Incidental nipple piercings. IMPRESSION: Stable and negative.  No cardiopulmonary abnormality. Electronically Signed   By: Odessa Fleming M.D.   On: 02/12/2020 22:05    Procedures Procedures (including critical care time)  Medications Ordered in ED Medications  sodium chloride flush (NS) 0.9 % injection 3 mL (3 mLs Intravenous Given 02/12/20 2218)    ED Course  I have reviewed the triage vital signs and the nursing notes.  Pertinent labs & imaging results that were available during  my care of the patient were reviewed by me and considered in my medical decision making (see chart for details).    MDM Rules/Calculators/A&P                      Patient to ED with persistent symptoms of chest tightness and extremity numbness and weakness.   Chart reviewed. She has had consistently negative lab studies 2/15 and today. She had a negative head CT 2/15. Do no feel this needs to be repeated tonight. She has 2 chest x-rays 2/15 and today, both negative for acute findings. VSS. No hypoxia. No concerning neurologic findings on today's exam.   She has neuro f/u in place. She appears stable for discharge at this time and is encouraged to keep f/u appointment. Patient reassured as to her condition. All questions answered.   Final Clinical Impression(s) / ED Diagnoses Final diagnoses:  None   1. Chest tightness 2. paresthesia  Rx / DC Orders ED Discharge Orders    None       Danne Harbor 02/12/20 2359    Molpus, Jonny Ruiz, MD 02/13/20 [phone]

## 2020-02-12 NOTE — ED Triage Notes (Signed)
Patient is complaining of right arm heaviness and chest numbness. Patient states she was laying down and it started. Patient was seen 2/16 and 2/17 for almost the same thing.

## 2020-02-13 NOTE — Discharge Instructions (Addendum)
Keep your scheduled appointment with neurology for further evaluation of ongoing extremity numbness/weakness.   All of your labs today are normal. Your exam is reassuring. It is safe for you to be discharged home for outpatient follow up.

## 2020-02-14 ENCOUNTER — Emergency Department (HOSPITAL_COMMUNITY)
Admission: EM | Admit: 2020-02-14 | Discharge: 2020-02-14 | Disposition: A | Discharge status: Home / Self Care | Payer: Commercial Managed Care - PPO | Attending: Emergency Medicine | Admitting: Emergency Medicine

## 2020-02-14 ENCOUNTER — Encounter (HOSPITAL_COMMUNITY): Payer: Self-pay | Admitting: Radiology

## 2020-02-14 ENCOUNTER — Other Ambulatory Visit: Payer: Self-pay

## 2020-02-14 ENCOUNTER — Emergency Department (HOSPITAL_COMMUNITY): Payer: Commercial Managed Care - PPO

## 2020-02-14 DIAGNOSIS — I10 Essential (primary) hypertension: Secondary | ICD-10-CM | POA: Diagnosis not present

## 2020-02-14 DIAGNOSIS — G4700 Insomnia, unspecified: Secondary | ICD-10-CM

## 2020-02-14 DIAGNOSIS — F419 Anxiety disorder, unspecified: Secondary | ICD-10-CM | POA: Insufficient documentation

## 2020-02-14 DIAGNOSIS — F17210 Nicotine dependence, cigarettes, uncomplicated: Secondary | ICD-10-CM | POA: Diagnosis not present

## 2020-02-14 DIAGNOSIS — R0600 Dyspnea, unspecified: Secondary | ICD-10-CM | POA: Diagnosis not present

## 2020-02-14 DIAGNOSIS — J45909 Unspecified asthma, uncomplicated: Secondary | ICD-10-CM | POA: Insufficient documentation

## 2020-02-14 DIAGNOSIS — Z79899 Other long term (current) drug therapy: Secondary | ICD-10-CM | POA: Diagnosis not present

## 2020-02-14 DIAGNOSIS — R0602 Shortness of breath: Secondary | ICD-10-CM | POA: Diagnosis present

## 2020-02-14 LAB — CBC
HCT: 40 % (ref 36.0–46.0)
Hemoglobin: 13.8 g/dL (ref 12.0–15.0)
MCH: 31.1 pg (ref 26.0–34.0)
MCHC: 34.5 g/dL (ref 30.0–36.0)
MCV: 90.1 fL (ref 80.0–100.0)
Platelets: 370 10*3/uL (ref 150–400)
RBC: 4.44 MIL/uL (ref 3.87–5.11)
RDW: 11.5 % (ref 11.5–15.5)
WBC: 5.1 10*3/uL (ref 4.0–10.5)
nRBC: 0 % (ref 0.0–0.2)

## 2020-02-14 LAB — D-DIMER, QUANTITATIVE: D-Dimer, Quant: 0.57 ug/mL-FEU — ABNORMAL HIGH (ref 0.00–0.50)

## 2020-02-14 LAB — I-STAT BETA HCG BLOOD, ED (MC, WL, AP ONLY): I-stat hCG, quantitative: <5 m[IU]/mL (ref <5)

## 2020-02-14 LAB — COMPREHENSIVE METABOLIC PANEL
ALT: 12 U/L (ref 0–44)
AST: 15 U/L (ref 15–41)
Albumin: 4.1 g/dL (ref 3.5–5.0)
Alkaline Phosphatase: 38 U/L (ref 38–126)
Anion gap: 12 (ref 5–15)
BUN: 9 mg/dL (ref 6–20)
CO2: 21 mmol/L — ABNORMAL LOW (ref 22–32)
Calcium: 9.4 mg/dL (ref 8.9–10.3)
Chloride: 106 mmol/L (ref 98–111)
Creatinine, Ser: 0.76 mg/dL (ref 0.44–1.00)
GFR calc Af Amer: >60 mL/min (ref >60)
GFR calc non Af Amer: >60 mL/min (ref >60)
Glucose, Bld: 89 mg/dL (ref 70–99)
Potassium: 3.6 mmol/L (ref 3.5–5.1)
Sodium: 139 mmol/L (ref 135–145)
Total Bilirubin: 1.3 mg/dL — ABNORMAL HIGH (ref 0.3–1.2)
Total Protein: 7.6 g/dL (ref 6.5–8.1)

## 2020-02-14 LAB — LIPASE, BLOOD: Lipase: 24 U/L (ref 11–51)

## 2020-02-14 MED ORDER — SODIUM CHLORIDE 0.9 % IV BOLUS
1000.0000 mL | Freq: Once | INTRAVENOUS | Status: AC
  Administered 2020-02-14: 1000 mL via INTRAVENOUS

## 2020-02-14 MED ORDER — SODIUM CHLORIDE 0.9% FLUSH
3.0000 mL | Freq: Once | INTRAVENOUS | Status: AC
  Administered 2020-02-14: 3 mL via INTRAVENOUS

## 2020-02-14 MED ORDER — LORAZEPAM 2 MG/ML IJ SOLN
1.0000 mg | Freq: Once | INTRAMUSCULAR | Status: AC
  Administered 2020-02-14: 1 mg via INTRAVENOUS
  Filled 2020-02-14: qty 1

## 2020-02-14 MED ORDER — IOHEXOL 350 MG/ML SOLN
80.0000 mL | Freq: Once | INTRAVENOUS | Status: AC | PRN
  Administered 2020-02-14: 80 mL via INTRAVENOUS

## 2020-02-14 MED ORDER — ONDANSETRON HCL 4 MG/2ML IJ SOLN
4.0000 mg | Freq: Once | INTRAMUSCULAR | Status: AC
  Administered 2020-02-14: 4 mg via INTRAVENOUS
  Filled 2020-02-14: qty 2

## 2020-02-14 MED ORDER — LORAZEPAM 0.5 MG PO TABS: 0.5000 mg | ORAL_TABLET | Freq: Three times a day (TID) | ORAL | 0 refills | Status: DC | PRN

## 2020-02-14 NOTE — ED Notes (Signed)
Patient verbalizes understanding of discharge instructions. Opportunity for questioning and answers were provided. Armband removed by staff, pt discharged from ED. Pt. ambulatory and discharged home.  

## 2020-02-14 NOTE — ED Provider Notes (Signed)
[facility]ERG[facility] DEPARTMENT Provider Note   CSN: [id number] Arrival date & time: 02/14/20  1951     History Chief Complaint  Patient presents with  . Chest Pain  . Shortness of Breath    Stacey Reid is a 24 y.o. female.  Pt presents to the ED today with sob and decreased appetite and decreased sleep.  Pt was diagnosed with Covid on 1/15.  She has been in the ED on 2/15, 16, 17, and 19 for sob, cp, tingling.  Pt feels like there is something wrong with her body.        Past Medical History:  Diagnosis Date  . Anemia affecting pregnancy 03/21/2017  . Asthma    years ago, exercise induced  . Headache    with pregnancy  . Heart murmur    history of, resolved 3 months later  . Hyperemesis 2016  . Hypertension   . Infection    UTI  . Pregnancy induced hypertension   . Syncope    in high school, found out had a heart murmur    Patient Active Problem List   Diagnosis Date Noted  . Post-operative state 07/16/2017  . Gestational hypertension 06/25/2017  . Sickle cell trait ([facility]) 11/13/2016  . Low vitamin D level 11/13/2016  . Asthma exacerbation, mild 11/05/2016    Past Surgical History:  Procedure Laterality Date  . LAPAROSCOPIC TUBAL LIGATION Bilateral 07/02/2017   Procedure: LAPAROSCOPIC TUBAL LIGATION - Filshie clips;  Surgeon: Chancy Milroy, MD;  Location: [facility];  Service: Gynecology;  Laterality: Bilateral;  . TUBAL LIGATION    . WISDOM TOOTH EXTRACTION       OB History    Gravida  3   Para  2   Term  2   Preterm  0   AB  1   Living  2     SAB  1   TAB  0   Ectopic  0   Multiple  0   Live Births  2           Family History  Problem Relation Age of Onset  . Asthma Mother   . Hypertension Mother   . Miscarriages / [place] Mother   . Healthy Father     Social History   Tobacco Use  . Smoking status: Current Some Day Smoker    Types: Cigarettes  . Smokeless tobacco: Never Used    Substance Use Topics  . Alcohol use: Yes    Comment: occ.  . Drug use: No    Home Medications Prior to Admission medications   Medication Sig Start Date End Date Taking? Authorizing Provider  albuterol (VENTOLIN HFA) 108 (90 Base) MCG/ACT inhaler Inhale 1-2 puffs into the lungs every 6 (six) hours as needed for shortness of breath. 09/15/19   [provider]  FLUoxetine (PROZAC) 10 MG capsule Take 10 mg by mouth 2 (two) times daily. 02/04/20   [provider]  hydrOXYzine (ATARAX/VISTARIL) 25 MG tablet Take 25 mg by mouth 4 (four) times daily as needed for anxiety. 02/04/20   [provider]  ibuprofen (ADVIL) 100 MG/5ML suspension Take 600 mg by mouth every 4 (four) hours as needed for mild pain.    [provider]  LORazepam (ATIVAN) 0.5 MG tablet Take 1 tablet (0.5 mg total) by mouth 3 (three) times daily as needed for anxiety. 02/14/20   Isla Pence, MD  metoCLOPramide (REGLAN) 10 MG tablet Take 1 tablet (10 mg  total) by mouth every 6 (six) hours. 02/08/20   Joy, Shawn C, PA-C  Multiple Vitamin (MULTIVITAMIN WITH MINERALS) TABS tablet Take 1 tablet by mouth daily.    [provider]    Allergies    Patient has no known allergies.  Review of Systems   Review of Systems  Constitutional: Positive for appetite change.  Respiratory: Positive for shortness of breath.   All other systems reviewed and are negative.   Physical Exam Updated Vital Signs BP 120/77   Pulse 82   Temp 98.9 F (37.2 C) (Oral)   Resp 15   Ht 5\' 3"  (1.6 m)   Wt 54.4 kg   LMP 01/29/2020   SpO2 100%   BMI 21.26 kg/m   Physical Exam Vitals and nursing note reviewed.  Constitutional:      Appearance: She is well-developed.  HENT:     Head: Normocephalic and atraumatic.  Eyes:     Extraocular Movements: Extraocular movements intact.     Pupils: Pupils are equal, round, and reactive to light.  Cardiovascular:     Rate and Rhythm: Normal rate and regular  rhythm.     Heart sounds: Normal heart sounds.  Pulmonary:     Effort: Pulmonary effort is normal.     Breath sounds: Normal breath sounds.  Abdominal:     General: Bowel sounds are normal.     Palpations: Abdomen is soft.  Musculoskeletal:        General: Normal range of motion.     Cervical back: Normal range of motion and neck supple.  Skin:    General: Skin is warm.     Capillary Refill: Capillary refill takes less than 2 seconds.  Neurological:     General: No focal deficit present.     Mental Status: She is alert and oriented to person, place, and time.  Psychiatric:        Mood and Affect: Mood is anxious.     ED Results / Procedures / Treatments   Labs (all labs ordered are listed, but only abnormal results are displayed) Labs Reviewed  COMPREHENSIVE METABOLIC PANEL - Abnormal; Notable for the following components:      Result Value   CO2 21 (*)    Total Bilirubin 1.3 (*)    All other components within normal limits  D-DIMER, QUANTITATIVE (NOT AT [facility]) - Abnormal; Notable for the following components:   D-Dimer, Quant 0.57 (*)    All other components within normal limits  LIPASE, BLOOD  CBC  URINALYSIS, ROUTINE W REFLEX MICROSCOPIC  I-STAT BETA HCG BLOOD, ED ([facility], [facility], [facility] ONLY)    EKG None  Radiology CT Angio Chest PE W and/or Wo Contrast  Result Date: 02/14/2020 CLINICAL DATA:  24 year old female with chest pain and shortness of breath for 1 week. Recent COVID-19. EXAM: CT ANGIOGRAPHY CHEST WITH CONTRAST TECHNIQUE: Multidetector CT imaging of the chest was performed using the standard protocol during bolus administration of intravenous contrast. Multiplanar CT image reconstructions and MIPs were obtained to evaluate the vascular anatomy. CONTRAST:  70mL OMNIPAQUE IOHEXOL 350 MG/ML SOLN COMPARISON:  Chest radiographs 02/12/2020. Chest CTA 01/15/2020. FINDINGS: Cardiovascular: Good contrast bolus timing in the pulmonary arterial tree. No focal filling defect  identified in the pulmonary arteries to suggest acute pulmonary embolism. No cardiomegaly or pericardial effusion. Negative visible aorta. No calcified coronary artery atherosclerosis is evident. Mediastinum/Nodes: Negative. Small volume residual thymus suspected. Lungs/Pleura: Stable lung volumes. Major airways are patent. The lungs appear stable and  clear. No pleural effusion or abnormal pulmonary opacity. Upper Abdomen: Negative visible liver, spleen, pancreas, kidneys and bowel. Musculoskeletal: Negative. Review of the MIP images confirms the above findings. IMPRESSION: Normal chest CTA. Negative for acute pulmonary embolus. Electronically Signed   By: Odessa Fleming M.D.   On: 02/14/2020 21:51    Procedures Procedures (including critical care time)  Medications Ordered in ED Medications  sodium chloride flush (NS) 0.9 % injection 3 mL (3 mLs Intravenous Given 02/14/20 2023)  ondansetron (ZOFRAN) injection 4 mg (4 mg Intravenous Given 02/14/20 2022)  sodium chloride 0.9 % bolus 1,000 mL (0 mLs Intravenous Stopped 02/14/20 2140)  LORazepam (ATIVAN) injection 1 mg (1 mg Intravenous Given 02/14/20 2022)  iohexol (OMNIPAQUE) 350 MG/ML injection 80 mL (80 mLs Intravenous Contrast Given 02/14/20 2134)    ED Course  I have reviewed the triage vital signs and the nursing notes.  Pertinent labs & imaging results that were available during my care of the patient were reviewed by me and considered in my medical decision making (see chart for details).    MDM Rules/Calculators/A&P                     Pt's work up is reassuring.  She feels much better after the fluids and ativan.  I think her sx are due to anxiety and panic about the recent covid infection.  She knows to return if worse.  F/u with pcp.  Final Clinical Impression(s) / ED Diagnoses Final diagnoses:  Dyspnea, unspecified type  Anxiety  Insomnia, unspecified type    Rx / DC Orders ED Discharge Orders         Ordered    LORazepam (ATIVAN)  0.5 MG tablet  3 times daily PRN     02/14/20 2217           Jacalyn Lefevre, MD 02/14/20 2219

## 2020-02-14 NOTE — ED Triage Notes (Signed)
Pt came in POV c/o of CP, SHOB. CP started last week and has progressively increased in pain. SHOB started  Last week and has progressively increased. Pt had COVID back in January. Pt states they have not had much sleep in the past week.

## 2020-02-28 ENCOUNTER — Emergency Department (HOSPITAL_BASED_OUTPATIENT_CLINIC_OR_DEPARTMENT_OTHER)
Admission: EM | Admit: 2020-02-28 | Discharge: 2020-02-28 | Disposition: A | Discharge status: Home / Self Care | Payer: Commercial Managed Care - PPO | Attending: Emergency Medicine | Admitting: Emergency Medicine

## 2020-02-28 ENCOUNTER — Encounter (HOSPITAL_BASED_OUTPATIENT_CLINIC_OR_DEPARTMENT_OTHER): Payer: Self-pay | Admitting: Emergency Medicine

## 2020-02-28 ENCOUNTER — Emergency Department (HOSPITAL_BASED_OUTPATIENT_CLINIC_OR_DEPARTMENT_OTHER): Payer: Commercial Managed Care - PPO

## 2020-02-28 ENCOUNTER — Other Ambulatory Visit: Payer: Self-pay

## 2020-02-28 DIAGNOSIS — R079 Chest pain, unspecified: Secondary | ICD-10-CM

## 2020-02-28 DIAGNOSIS — J45909 Unspecified asthma, uncomplicated: Secondary | ICD-10-CM | POA: Insufficient documentation

## 2020-02-28 DIAGNOSIS — R0789 Other chest pain: Secondary | ICD-10-CM | POA: Diagnosis not present

## 2020-02-28 DIAGNOSIS — Z79899 Other long term (current) drug therapy: Secondary | ICD-10-CM | POA: Diagnosis not present

## 2020-02-28 DIAGNOSIS — I10 Essential (primary) hypertension: Secondary | ICD-10-CM | POA: Diagnosis not present

## 2020-02-28 DIAGNOSIS — F17210 Nicotine dependence, cigarettes, uncomplicated: Secondary | ICD-10-CM | POA: Insufficient documentation

## 2020-02-28 MED ORDER — ALUM & MAG HYDROXIDE-SIMETH 200-200-20 MG/5ML PO SUSP
30.0000 mL | Freq: Once | ORAL | Status: AC
  Administered 2020-02-28: 30 mL via ORAL
  Filled 2020-02-28: qty 30

## 2020-02-28 NOTE — ED Provider Notes (Signed)
[facility] EMERGENCY DEPARTMENT Provider Note  CSN: [id number] Arrival date & time: 02/28/20 0254  Chief Complaint(s) Chest Pain  HPI Stacey Reid is a 24 y.o. female with a past medical history listed below, who recently got over Covid infection diagnosed on January 15 who presents to the emergency department with burning epigastric sensation that occurred several hours prior to arrival that improved with a bowel movement.  Patient also noted sharp left lateral pain radiating to the back while lying down, exacerbated with movement.  No other alleviating or aggravating factors.  No associated shortness of breath.  No fevers or chills.  No nausea or vomiting.  No other physical complaints.  HPI  Past Medical History Past Medical History:  Diagnosis Date  . Anemia affecting pregnancy 03/21/2017  . Asthma    years ago, exercise induced  . Headache    with pregnancy  . Heart murmur    history of, resolved 3 months later  . Hyperemesis 2016  . Hypertension   . Infection    UTI  . Pregnancy induced hypertension   . Syncope    in high school, found out had a heart murmur   Patient Active Problem List   Diagnosis Date Noted  . Post-operative state 07/16/2017  . Gestational hypertension 06/25/2017  . Sickle cell trait ([facility]) 11/13/2016  . Low vitamin D level 11/13/2016  . Asthma exacerbation, mild 11/05/2016   Home Medication(s) Prior to Admission medications   Medication Sig Start Date End Date Taking? Authorizing Provider  albuterol (VENTOLIN HFA) 108 (90 Base) MCG/ACT inhaler Inhale 1-2 puffs into the lungs every 6 (six) hours as needed for shortness of breath. 09/15/19   [provider]  FLUoxetine (PROZAC) 10 MG capsule Take 10 mg by mouth 2 (two) times daily. 02/04/20   [provider]  hydrOXYzine (ATARAX/VISTARIL) 25 MG tablet Take 25 mg by mouth 4 (four) times daily as needed for anxiety. 02/04/20   [provider]  ibuprofen  (ADVIL) 100 MG/5ML suspension Take 600 mg by mouth every 4 (four) hours as needed for mild pain.    [provider]  LORazepam (ATIVAN) 0.5 MG tablet Take 1 tablet (0.5 mg total) by mouth 3 (three) times daily as needed for anxiety. 02/14/20   Jacalyn Lefevre, MD  metoCLOPramide (REGLAN) 10 MG tablet Take 1 tablet (10 mg total) by mouth every 6 (six) hours. 02/08/20   Joy, Shawn C, PA-C  Multiple Vitamin (MULTIVITAMIN WITH MINERALS) TABS tablet Take 1 tablet by mouth daily.    [provider]                                                                                                                                    Past Surgical History Past Surgical History:  Procedure Laterality Date  . LAPAROSCOPIC TUBAL LIGATION Bilateral 07/02/2017   Procedure: LAPAROSCOPIC TUBAL LIGATION - Filshie clips;  Surgeon: Nettie Elm  L, MD;  Location: [facility];  Service: Gynecology;  Laterality: Bilateral;  . TUBAL LIGATION    . WISDOM TOOTH EXTRACTION     Family History Family History  Problem Relation Age of Onset  . Asthma Mother   . Hypertension Mother   . Miscarriages / [place] Mother   . Healthy Father     Social History Social History   Tobacco Use  . Smoking status: Current Some Day Smoker    Types: Cigarettes  . Smokeless tobacco: Never Used  Substance Use Topics  . Alcohol use: Yes    Comment: occ.  . Drug use: No   Allergies Patient has no known allergies.  Review of Systems Review of Systems All other systems are reviewed and are negative for acute change except as noted in the HPI  Physical Exam Vital Signs  I have reviewed the triage vital signs BP 131/90 (BP Location: Right Arm)   Pulse 78   Temp 98.1 F (36.7 C) (Oral)   Resp 20   Ht 5\' 3"  (1.6 m)   Wt 54.4 kg   LMP 01/29/2020   SpO2 100%   BMI 21.26 kg/m   Physical Exam Vitals reviewed.  Constitutional:      General: She is not in acute distress.    Appearance: She is well-developed.  She is not diaphoretic.  HENT:     Head: Normocephalic and atraumatic.     Nose: Nose normal.  Eyes:     General: No scleral icterus.       Right eye: No discharge.        Left eye: No discharge.     Conjunctiva/sclera: Conjunctivae normal.     Pupils: Pupils are equal, round, and reactive to light.  Cardiovascular:     Rate and Rhythm: Normal rate and regular rhythm.     Heart sounds: No murmur. No friction rub. No gallop.   Pulmonary:     Effort: Pulmonary effort is normal. No respiratory distress.     Breath sounds: Normal breath sounds. No stridor. No rales.  Chest:     Chest wall: Tenderness present.    Abdominal:     General: There is no distension.     Palpations: Abdomen is soft.     Tenderness: There is no abdominal tenderness.  Musculoskeletal:     Cervical back: Normal range of motion and neck supple.     Thoracic back: Spasms and tenderness present.       Back:  Skin:    General: Skin is warm and dry.     Findings: No erythema or rash.  Neurological:     Mental Status: She is alert and oriented to person, place, and time.     ED Results and Treatments Labs (all labs ordered are listed, but only abnormal results are displayed) Labs Reviewed - No data to display  EKG  EKG Interpretation  Date/Time:  Sunday February 28 2020 02:36:47 EST Ventricular Rate:  72 PR Interval:    QRS Duration: 84 QT Interval:  425 QTC Calculation: 466 R Axis:   33 Text Interpretation: Sinus rhythm No significant change since last tracing Confirmed by Addison Lank [phone]) on 02/28/2020 2:54:12 AM      Radiology DG Chest 2 View  Result Date: 02/28/2020 CLINICAL DATA:  Left-sided chest pain. Shoulder pain. COVID positive January 2021. EXAM: CHEST - 2 VIEW COMPARISON:  Most recent chest CT 02/14/2020, most recent radiograph 02/12/2020 FINDINGS: The cardiomediastinal  contours are normal. The lungs are clear. Pulmonary vasculature is normal. No consolidation, pleural effusion, or pneumothorax. No acute osseous abnormalities are seen. IMPRESSION: Negative radiographs of the chest. Electronically Signed   By: Keith Rake M.D.   On: 02/28/2020 03:10    Pertinent labs & imaging results that were available during my care of the patient were reviewed by me and considered in my medical decision making (see chart for details).  Medications Ordered in ED Medications  alum & mag hydroxide-simeth (MAALOX/MYLANTA) 200-200-20 MG/5ML suspension 30 mL (30 mLs Oral Given 02/28/20 0235)                                                                                                                                    Procedures Procedures  (including critical care time)  Medical Decision Making / ED Course I have reviewed the nursing notes for this encounter and the patient's prior records (if available in EHR or on provided paperwork).   DAWNETTE MIONE was evaluated in Emergency Department on 02/28/2020 for the symptoms described in the history of present illness. She was evaluated in the context of the global COVID-19 pandemic, which necessitated consideration that the patient might be at risk for infection with the SARS-CoV-2 virus that causes COVID-19. Institutional protocols and algorithms that pertain to the evaluation of patients at risk for COVID-19 are in a state of rapid change based on information released by regulatory bodies including the CDC and federal and state organizations. These policies and algorithms were followed during the patient's care in the ED.  Atypical chest pain highly inconsistent with ACS.  EKG without acute ischemic changes or evidence of pericarditis.  Low suspicion for cardiac etiology.  No cardiac markers necessary at this time.  Low suspicion for pulmonary embolism.  Presentation not classic for aortic dissection or esophageal  perforation. Chest x-ray without evidence suggestive of pneumonia, pneumothorax, pneumomediastinum.  No abnormal contour of the mediastinum to suggest dissection. No evidence of acute injuries.   Patient has spastic left parascapular musculature which is tender to palpation and reproduces her pain.  Likely the source of her symptoms.  Patient also endorsed burning sensation likely GI related.  Provided with GI cocktail.  Abdomen benign.  Low suspicion for serious intra-abdominal inflammatory/infectious process requiring work-up or imaging at this time.  Final Clinical Impression(s) / ED Diagnoses Final diagnoses:  Chest pain  Atypical chest pain    The patient appears reasonably screened and/or stabilized for discharge and I doubt any other medical condition or other [facility] requiring further screening, evaluation, or treatment in the ED at this time prior to discharge. Safe for discharge with strict return precautions.  Disposition: Discharge  Condition: Good  I have discussed the results, Dx and Tx plan with the patient/family who expressed understanding and agree(s) with the plan. Discharge instructions discussed at length. The patient/family was given strict return precautions who verbalized understanding of the instructions. No further questions at time of discharge.    ED Discharge Orders    None      Follow Up: Burnice Logan, [place] [address] [place] [address] [phone]  Schedule an appointment as soon as possible for a visit  As needed     This chart was dictated using voice recognition software.  Despite best efforts to proofread,  errors can occur which can change the documentation meaning.   Nira Conn, MD 02/28/20 [phone]

## 2020-02-28 NOTE — Discharge Instructions (Addendum)
You may use over-the-counter Motrin (Ibuprofen), Acetaminophen (Tylenol), topical muscle creams such as SalonPas, Icy Hot, Bengay, etc. Please stretch, apply heat, and have massage therapy for additional assistance. ° °

## 2020-02-28 NOTE — ED Triage Notes (Signed)
Pt c/o 6/10 left side cp radiating to her left shoulder for one hour with some abd pain.

## 2020-03-14 ENCOUNTER — Encounter: Payer: Self-pay | Admitting: Diagnostic Neuroimaging

## 2020-03-14 ENCOUNTER — Ambulatory Visit: Payer: Commercial Managed Care - PPO | Admitting: Diagnostic Neuroimaging

## 2020-03-14 ENCOUNTER — Telehealth: Payer: Self-pay | Admitting: *Deleted

## 2020-03-14 NOTE — Telephone Encounter (Signed)
Patient was no show for new patient appointment today. 

## 2020-03-15 ENCOUNTER — Emergency Department (HOSPITAL_COMMUNITY): Payer: Commercial Managed Care - PPO

## 2020-03-15 ENCOUNTER — Other Ambulatory Visit: Payer: Self-pay

## 2020-03-15 ENCOUNTER — Encounter (HOSPITAL_COMMUNITY): Payer: Self-pay | Admitting: Emergency Medicine

## 2020-03-15 ENCOUNTER — Emergency Department (HOSPITAL_COMMUNITY)
Admission: EM | Admit: 2020-03-15 | Discharge: 2020-03-16 | Disposition: A | Discharge status: Home / Self Care | Payer: Commercial Managed Care - PPO | Attending: Emergency Medicine | Admitting: Emergency Medicine

## 2020-03-15 DIAGNOSIS — F17210 Nicotine dependence, cigarettes, uncomplicated: Secondary | ICD-10-CM | POA: Diagnosis not present

## 2020-03-15 DIAGNOSIS — Z79899 Other long term (current) drug therapy: Secondary | ICD-10-CM | POA: Diagnosis not present

## 2020-03-15 DIAGNOSIS — I10 Essential (primary) hypertension: Secondary | ICD-10-CM | POA: Diagnosis not present

## 2020-03-15 DIAGNOSIS — M79602 Pain in left arm: Secondary | ICD-10-CM | POA: Insufficient documentation

## 2020-03-15 DIAGNOSIS — R079 Chest pain, unspecified: Secondary | ICD-10-CM | POA: Diagnosis present

## 2020-03-15 LAB — CBC
HCT: 37.5 % (ref 36.0–46.0)
Hemoglobin: 12.7 g/dL (ref 12.0–15.0)
MCH: 31.2 pg (ref 26.0–34.0)
MCHC: 33.9 g/dL (ref 30.0–36.0)
MCV: 92.1 fL (ref 80.0–100.0)
Platelets: 354 10*3/uL (ref 150–400)
RBC: 4.07 MIL/uL (ref 3.87–5.11)
RDW: 11.5 % (ref 11.5–15.5)
WBC: 7 10*3/uL (ref 4.0–10.5)
nRBC: 0 % (ref 0.0–0.2)

## 2020-03-15 LAB — I-STAT BETA HCG BLOOD, ED (MC, WL, AP ONLY): I-stat hCG, quantitative: <5 m[IU]/mL (ref <5)

## 2020-03-15 MED ORDER — DEXAMETHASONE SODIUM PHOSPHATE 10 MG/ML IJ SOLN
10.0000 mg | Freq: Once | INTRAMUSCULAR | Status: AC
  Administered 2020-03-15: 10 mg via INTRAVENOUS
  Filled 2020-03-15: qty 1

## 2020-03-15 MED ORDER — SODIUM CHLORIDE 0.9% FLUSH: 3.0000 mL | Freq: Once | INTRAVENOUS | Status: DC

## 2020-03-15 MED ORDER — KETOROLAC TROMETHAMINE 30 MG/ML IJ SOLN
30.0000 mg | Freq: Once | INTRAMUSCULAR | Status: AC
  Administered 2020-03-15: 30 mg via INTRAVENOUS
  Filled 2020-03-15: qty 1

## 2020-03-15 NOTE — ED Provider Notes (Addendum)
[facility]-EMERGENCY DEPT Provider Note   CSN: [id number] Arrival date & time: 03/15/20  2151     History Chief Complaint  Patient presents with  . Chest Pain    Stacey Reid is a 24 y.o. female.  The history is provided by the patient and medical records.  Chest Pain   24 year old female with history of asthma, anemia, headaches, HTN, presenting to the ED for chest pain.  Patient states "I have been dealing with this for a while now".  She reports she had an injury a few months ago while on a work-trip and has had some ongoing pain on her left side since then.  She recently got established with a chiropractor and has been doing physical therapy.  She was told she had a pinched nerve.  States symptoms seem to have been improving with therapy but had recurrent pain today.  States it is on the left scapula and seems to radiate down left shoulder and arm.  She does report a pins-and-needles type sensation in both of her hands.  She denies any new injury or trauma today the would have exacerbated the symptoms.  She denies any known cardiac history.  She is not a smoker.  She did not take any medication for her symptoms today.  Past Medical History:  Diagnosis Date  . Anemia affecting pregnancy 03/21/2017  . Asthma    years ago, exercise induced  . Headache    with pregnancy  . Heart murmur    history of, resolved 3 months later  . Hyperemesis 2016  . Hypertension   . Infection    UTI  . Pregnancy induced hypertension   . Syncope    in high school, found out had a heart murmur    Patient Active Problem List   Diagnosis Date Noted  . Post-operative state 07/16/2017  . Gestational hypertension 06/25/2017  . Sickle cell trait (HCC) 11/13/2016  . Low vitamin D level 11/13/2016  . Asthma exacerbation, mild 11/05/2016    Past Surgical History:  Procedure Laterality Date  . LAPAROSCOPIC TUBAL LIGATION Bilateral 07/02/2017   Procedure: LAPAROSCOPIC  TUBAL LIGATION - Filshie clips;  Surgeon: Hermina Staggers, MD;  Location: [facility];  Service: Gynecology;  Laterality: Bilateral;  . TUBAL LIGATION    . WISDOM TOOTH EXTRACTION       OB History    Gravida  3   Para  2   Term  2   Preterm  0   AB  1   Living  2     SAB  1   TAB  0   Ectopic  0   Multiple  0   Live Births  2           Family History  Problem Relation Age of Onset  . Asthma Mother   . Hypertension Mother   . Miscarriages / [place] Mother   . Healthy Father     Social History   Tobacco Use  . Smoking status: Current Some Day Smoker    Types: Cigarettes  . Smokeless tobacco: Never Used  Substance Use Topics  . Alcohol use: Yes    Comment: occ.  . Drug use: No    Home Medications Prior to Admission medications   Medication Sig Start Date End Date Taking? Authorizing Provider  albuterol (VENTOLIN HFA) 108 (90 Base) MCG/ACT inhaler Inhale 1-2 puffs into the lungs every 6 (six) hours as needed for shortness of breath. 09/15/19  [provider]  FLUoxetine (PROZAC) 10 MG capsule Take 10 mg by mouth 2 (two) times daily. 02/04/20   [provider]  hydrOXYzine (ATARAX/VISTARIL) 25 MG tablet Take 25 mg by mouth 4 (four) times daily as needed for anxiety. 02/04/20   [provider]  ibuprofen (ADVIL) 100 MG/5ML suspension Take 600 mg by mouth every 4 (four) hours as needed for mild pain.    [provider]  LORazepam (ATIVAN) 0.5 MG tablet Take 1 tablet (0.5 mg total) by mouth 3 (three) times daily as needed for anxiety. 02/14/20   Jacalyn Lefevre, MD  metoCLOPramide (REGLAN) 10 MG tablet Take 1 tablet (10 mg total) by mouth every 6 (six) hours. 02/08/20   Joy, Shawn C, PA-C  Multiple Vitamin (MULTIVITAMIN WITH MINERALS) TABS tablet Take 1 tablet by mouth daily.    [provider]    Allergies    Patient has no known allergies.  Review of Systems   Review of Systems  Cardiovascular: Positive for  chest pain.  All other systems reviewed and are negative.   Physical Exam Updated Vital Signs BP 119/80 (BP Location: Right Arm)   Pulse 76   Temp 98.6 F (37 C) (Oral)   Resp 16   Ht 5\' 3"  (1.6 m)   Wt 57.6 kg   LMP 02/23/2020   SpO2 100%   BMI 22.50 kg/m   Physical Exam Vitals and nursing note reviewed.  Constitutional:      Appearance: She is well-developed.  HENT:     Head: Normocephalic and atraumatic.  Eyes:     Conjunctiva/sclera: Conjunctivae normal.     Pupils: Pupils are equal, round, and reactive to light.  Cardiovascular:     Rate and Rhythm: Normal rate and regular rhythm.     Heart sounds: Normal heart sounds.  Pulmonary:     Effort: Pulmonary effort is normal.     Breath sounds: Normal breath sounds. No decreased breath sounds, wheezing or rhonchi.  Abdominal:     General: Bowel sounds are normal.     Palpations: Abdomen is soft.  Musculoskeletal:        General: Normal range of motion.     Cervical back: Normal range of motion.     Comments: Left arm and shoulder normal in appearance without swelling or deformity noted; she maintains full ROM of left shoulder without elicited pain, normal grip strength and sensation of left arm  Skin:    General: Skin is warm and dry.  Neurological:     Mental Status: She is alert and oriented to person, place, and time.     ED Results / Procedures / Treatments   Labs (all labs ordered are listed, but only abnormal results are displayed) Labs Reviewed  BASIC METABOLIC PANEL  CBC  I-STAT BETA HCG BLOOD, ED ([facility], [facility], [facility] ONLY)  TROPONIN I (HIGH SENSITIVITY)    EKG None  Radiology DG Chest 2 View  Result Date: 03/15/2020 CLINICAL DATA:  Chest pain. EXAM: CHEST - 2 VIEW COMPARISON:  February 28, 2020 FINDINGS: The heart size and mediastinal contours are within normal limits. Both lungs are clear. Bilateral radiopaque nipple piercings are seen. The visualized skeletal structures are unremarkable. IMPRESSION: No  active cardiopulmonary disease. Electronically Signed   By: March 01, 2020 M.D.   On: 03/15/2020 23:07    Procedures Procedures (including critical care time)  Medications Ordered in ED Medications  sodium chloride flush (NS) 0.9 % injection 3 mL (0 mLs Intravenous Hold  03/15/20 2349)  ketorolac (TORADOL) 30 MG/ML injection 30 mg (30 mg Intravenous Given 03/15/20 2347)  dexamethasone (DECADRON) injection 10 mg (10 mg Intravenous Given 03/15/20 2347)    ED Course  I have reviewed the triage vital signs and the nursing notes.  Pertinent labs & imaging results that were available during my care of the patient were reviewed by me and considered in my medical decision making (see chart for details).    MDM Rules/Calculators/A&P  24 y.o. F here with left sided chest pain.  After talking with her more in detail about her symptoms, she has been having these issues for quite some time and is currently being treated by chiropractor and physical therapist for pinched nerve.  Today she reports pain along left shoulder blade, into the left chest wall, and down left arm.  This does seem consistent with a radicular type pain.  She does not have any focal numbness or weakness, maintains normal range of motion and grip strength.  No sensory deficit noted.  EKG here is nonischemic.  Chest x-ray is clear.  Labs pending.  Labs are all reassuring.  After Toradol and Decadron here, patient symptoms have resolved.  She denies any pain or pins-and-needles sensation of the left arm.  I feel it is more likely related to her pinched nerve rather than ACS, PE, dissection, or other acute cardiac event.  I do not feel she requires further cardiac work-up here today.  She has follow-up with her chiropractor and physical therapy later today.  She will discuss this ER visit with them so they are aware.  She may return here for any new or acute changes.  Final Clinical Impression(s) / ED Diagnoses Final diagnoses:  Left  arm pain    Rx / DC Orders ED Discharge Orders    None       Larene Pickett, PA-C 03/16/20 0054    Larene Pickett, PA-C 03/16/20 0054    Orpah Greek, MD 03/16/20 [phone]

## 2020-03-15 NOTE — ED Triage Notes (Addendum)
Patient is complaining of left chest pain that is radiating down her left arm. This started at 2000 tonight.

## 2020-03-16 LAB — BASIC METABOLIC PANEL
Anion gap: 8 (ref 5–15)
BUN: 10 mg/dL (ref 6–20)
CO2: 24 mmol/L (ref 22–32)
Calcium: 9 mg/dL (ref 8.9–10.3)
Chloride: 107 mmol/L (ref 98–111)
Creatinine, Ser: 0.69 mg/dL (ref 0.44–1.00)
GFR calc Af Amer: >60 mL/min (ref >60)
GFR calc non Af Amer: >60 mL/min (ref >60)
Glucose, Bld: 96 mg/dL (ref 70–99)
Potassium: 3.6 mmol/L (ref 3.5–5.1)
Sodium: 139 mmol/L (ref 135–145)

## 2020-03-16 LAB — TROPONIN I (HIGH SENSITIVITY): Troponin I (High Sensitivity): <2 ng/L (ref <18)

## 2020-03-16 NOTE — Discharge Instructions (Signed)
Follow-up with your chiropractor and physical therapist later today as scheduled. In case they are curious, you received Decadron and Toradol here in the ER. You may also follow-up with your primary care doctor. Return here for any new or acute changes.

## 2020-04-27 ENCOUNTER — Emergency Department (HOSPITAL_BASED_OUTPATIENT_CLINIC_OR_DEPARTMENT_OTHER): Payer: Commercial Managed Care - PPO

## 2020-04-27 ENCOUNTER — Encounter (HOSPITAL_BASED_OUTPATIENT_CLINIC_OR_DEPARTMENT_OTHER): Payer: Self-pay

## 2020-04-27 ENCOUNTER — Other Ambulatory Visit: Payer: Self-pay

## 2020-04-27 ENCOUNTER — Emergency Department (HOSPITAL_BASED_OUTPATIENT_CLINIC_OR_DEPARTMENT_OTHER)
Admission: EM | Admit: 2020-04-27 | Discharge: 2020-04-28 | Disposition: A | Discharge status: Home / Self Care | Payer: Commercial Managed Care - PPO | Attending: Emergency Medicine | Admitting: Emergency Medicine

## 2020-04-27 DIAGNOSIS — I10 Essential (primary) hypertension: Secondary | ICD-10-CM | POA: Insufficient documentation

## 2020-04-27 DIAGNOSIS — J45909 Unspecified asthma, uncomplicated: Secondary | ICD-10-CM | POA: Insufficient documentation

## 2020-04-27 DIAGNOSIS — M5412 Radiculopathy, cervical region: Secondary | ICD-10-CM | POA: Diagnosis not present

## 2020-04-27 DIAGNOSIS — R079 Chest pain, unspecified: Secondary | ICD-10-CM | POA: Diagnosis present

## 2020-04-27 DIAGNOSIS — R0789 Other chest pain: Secondary | ICD-10-CM | POA: Insufficient documentation

## 2020-04-27 DIAGNOSIS — Z87891 Personal history of nicotine dependence: Secondary | ICD-10-CM | POA: Diagnosis not present

## 2020-04-27 DIAGNOSIS — Z79899 Other long term (current) drug therapy: Secondary | ICD-10-CM | POA: Insufficient documentation

## 2020-04-27 LAB — BASIC METABOLIC PANEL
Anion gap: 6 (ref 5–15)
BUN: 15 mg/dL (ref 6–20)
CO2: 27 mmol/L (ref 22–32)
Calcium: 9 mg/dL (ref 8.9–10.3)
Chloride: 104 mmol/L (ref 98–111)
Creatinine, Ser: 0.78 mg/dL (ref 0.44–1.00)
GFR calc Af Amer: >60 mL/min (ref >60)
GFR calc non Af Amer: >60 mL/min (ref >60)
Glucose, Bld: 101 mg/dL — ABNORMAL HIGH (ref 70–99)
Potassium: 4 mmol/L (ref 3.5–5.1)
Sodium: 137 mmol/L (ref 135–145)

## 2020-04-27 LAB — CBC
HCT: 36.5 % (ref 36.0–46.0)
Hemoglobin: 12.7 g/dL (ref 12.0–15.0)
MCH: 31.4 pg (ref 26.0–34.0)
MCHC: 34.8 g/dL (ref 30.0–36.0)
MCV: 90.3 fL (ref 80.0–100.0)
Platelets: 372 10*3/uL (ref 150–400)
RBC: 4.04 MIL/uL (ref 3.87–5.11)
RDW: 11.4 % — ABNORMAL LOW (ref 11.5–15.5)
WBC: 5.4 10*3/uL (ref 4.0–10.5)
nRBC: 0 % (ref 0.0–0.2)

## 2020-04-27 LAB — PREGNANCY, URINE: Preg Test, Ur: NEGATIVE

## 2020-04-27 LAB — TROPONIN I (HIGH SENSITIVITY): Troponin I (High Sensitivity): <2 ng/L (ref <18)

## 2020-04-27 MED ORDER — SODIUM CHLORIDE 0.9% FLUSH
3.0000 mL | Freq: Once | INTRAVENOUS | Status: DC
  Filled 2020-04-27: qty 3

## 2020-04-27 NOTE — ED Provider Notes (Signed)
[facility] Provider Note: Lowella Dell, MD, FACEP  CSN: [id number] MRN: [id number] ARRIVAL: 04/27/20 at 2142 ROOM: [facility]08/[facility]   CHIEF COMPLAINT  Chest Pain   HISTORY OF PRESENT ILLNESS  04/27/20 11:56 PM Stacey Reid is a 24 y.o. female with chest pain for the past 2 days.  The pain is located to the left of her sternum, is well localized and pinching in nature.  It is sometimes worse with movement or deep breathing.  She rates it as a 4 out of 10.  She has had similar pain in the past since January.  She recently found out she has cervical radiculopathy which is causing pain radiating to her left arm.  She is equivocal about having shortness of breath with this.   Past Medical History:  Diagnosis Date  . Anemia affecting pregnancy 03/21/2017  . Asthma    years ago, exercise induced  . Headache    with pregnancy  . Heart murmur    history of, resolved 3 months later  . Hyperemesis 2016  . Hypertension   . Infection    UTI  . Pregnancy induced hypertension   . Syncope    in high school, found out had a heart murmur    Past Surgical History:  Procedure Laterality Date  . LAPAROSCOPIC TUBAL LIGATION Bilateral 07/02/2017   Procedure: LAPAROSCOPIC TUBAL LIGATION - Filshie clips;  Surgeon: Hermina Staggers, MD;  Location: [facility];  Service: Gynecology;  Laterality: Bilateral;  . TUBAL LIGATION    . WISDOM TOOTH EXTRACTION      Family History  Problem Relation Age of Onset  . Asthma Mother   . Hypertension Mother   . Miscarriages / [place] Mother   . Healthy Father     Social History   Tobacco Use  . Smoking status: Former Games developer  . Smokeless tobacco: Never Used  Substance Use Topics  . Alcohol use: Yes    Comment: occ.  . Drug use: No    Prior to Admission medications   Medication Sig Start Date End Date Taking? Authorizing Provider  albuterol (VENTOLIN HFA) 108 (90 Base) MCG/ACT inhaler Inhale 1-2 puffs into the lungs every 6 (six)  hours as needed for shortness of breath. 09/15/19   [provider]  FLUoxetine (PROZAC) 10 MG capsule Take 10 mg by mouth 2 (two) times daily. 02/04/20   [provider]  hydrOXYzine (ATARAX/VISTARIL) 25 MG tablet Take 25 mg by mouth 4 (four) times daily as needed for anxiety. 02/04/20   [provider]  ibuprofen (ADVIL) 100 MG/5ML suspension Take 600 mg by mouth every 4 (four) hours as needed for mild pain.    [provider]  Multiple Vitamin (MULTIVITAMIN WITH MINERALS) TABS tablet Take 1 tablet by mouth daily.    [provider]  naproxen (NAPROSYN) 375 MG tablet Take 1 tablet twice daily as needed for pain. 04/28/20   Kemari Mares, MD  metoCLOPramide (REGLAN) 10 MG tablet Take 1 tablet (10 mg total) by mouth every 6 (six) hours. Patient not taking: Reported on 03/16/2020 02/08/20 04/28/20  Anselm Pancoast, PA-C    Allergies Patient has no known allergies.   REVIEW OF SYSTEMS  Negative except as noted here or in the History of Present Illness.   PHYSICAL EXAMINATION  Initial Vital Signs Blood pressure (!) 130/96, pulse 85, temperature 98.3 F (36.8 C), temperature source Oral, resp. rate 18, height 5\' 3"  (1.6 m), weight 57.2 kg, last menstrual period 04/22/2020,  SpO2 100 %.  Examination General: Well-developed, thin female in no acute distress; appearance consistent with age of record HENT: normocephalic; atraumatic Eyes: pupils equal, round and reactive to light; extraocular muscles intact Neck: supple Heart: regular rate and rhythm Lungs: clear to auscultation bilaterally Chest: Nontender Abdomen: soft; nondistended; nontender; bowel sounds present Extremities: No deformity; full range of motion; pulses normal Neurologic: Awake, alert and oriented; motor function intact in all extremities and symmetric; no facial droop Skin: Warm and dry Psychiatric: Normal mood and affect   RESULTS  Summary of this visit's results, reviewed and  interpreted by myself:   EKG Interpretation  Date/Time:  Wednesday Apr 27 2020 21:46:32 EDT Ventricular Rate:  75 PR Interval:  132 QRS Duration: 76 QT Interval:  380 QTC Calculation: 424 R Axis:   8 Text Interpretation: Normal sinus rhythm with sinus arrhythmia Normal ECG No significant change was found Confirmed by Nevelyn Mellott, Jenny Reichmann [phone]) on 04/27/2020 10:53:59 PM      Laboratory Studies: Results for orders placed or performed during the hospital encounter of 04/27/20 (from the past 24 hour(s))  Pregnancy, urine     Status: None   Collection Time: 04/27/20 10:20 PM  Result Value Ref Range   Preg Test, Ur NEGATIVE NEGATIVE  Basic metabolic panel     Status: Abnormal   Collection Time: 04/27/20 10:55 PM  Result Value Ref Range   Sodium 137 135 - 145 mmol/L   Potassium 4.0 3.5 - 5.1 mmol/L   Chloride 104 98 - 111 mmol/L   CO2 27 22 - 32 mmol/L   Glucose, Bld 101 (H) 70 - 99 mg/dL   BUN 15 6 - 20 mg/dL   Creatinine, Ser 0.78 0.44 - 1.00 mg/dL   Calcium 9.0 8.9 - 10.3 mg/dL   GFR calc non Af Amer >60 >60 mL/min   GFR calc Af Amer >60 >60 mL/min   Anion gap 6 5 - 15  CBC     Status: Abnormal   Collection Time: 04/27/20 10:55 PM  Result Value Ref Range   WBC 5.4 4.0 - 10.5 K/uL   RBC 4.04 3.87 - 5.11 MIL/uL   Hemoglobin 12.7 12.0 - 15.0 g/dL   HCT 36.5 36.0 - 46.0 %   MCV 90.3 80.0 - 100.0 fL   MCH 31.4 26.0 - 34.0 pg   MCHC 34.8 30.0 - 36.0 g/dL   RDW 11.4 (L) 11.5 - 15.5 %   Platelets 372 150 - 400 K/uL   nRBC 0.0 0.0 - 0.2 %  Troponin I (High Sensitivity)     Status: None   Collection Time: 04/27/20 10:55 PM  Result Value Ref Range   Troponin I (High Sensitivity) <2 <18 ng/L   Imaging Studies: DG Chest 2 View  Result Date: 04/27/2020 CLINICAL DATA:  Chest pain for 2 days denies fever or flu symptoms EXAM: CHEST - 2 VIEW COMPARISON:  Radiograph 04/08/2020, CT 02/14/2020 FINDINGS: No consolidation, features of edema, pneumothorax, or effusion. Pulmonary vascularity is  normally distributed. The cardiomediastinal contours are unremarkable. No acute osseous or soft tissue abnormality. Bilateral nipple ornamentation. IMPRESSION: No acute cardiopulmonary abnormality. Electronically Signed   By: Lovena Le M.D.   On: 04/27/2020 22:08    ED COURSE and MDM  Nursing notes, initial and subsequent vitals signs, including pulse oximetry, reviewed and interpreted by myself.  Vitals:   04/27/20 2151  BP: (!) 130/96  Pulse: 85  Resp: 18  Temp: 98.3 F (36.8 C)  TempSrc: Oral  SpO2: 100%  Weight: 57.2 kg  Height: 5\' 3"  (1.6 m)   Medications  sodium chloride flush (NS) 0.9 % injection 3 mL (3 mLs Intravenous Not Given 04/27/20 2354)   Patient's work-up including EKG, lab work and chest x-ray unremarkable.  I suspect her pain is likely due to cervical radiculopathy.  She has no significant risk factors for cardiac etiology and the nature of her pain is atypical for cardiac.   PROCEDURES  Procedures   ED DIAGNOSES     ICD-10-CM   1. Atypical chest pain  R07.89   2. Cervical radiculopathy  M54.12        Shenee Wignall, 06/27/20, MD 04/28/20 [phone]

## 2020-04-27 NOTE — ED Triage Notes (Signed)
Pt c/o CP x 2 days-denies fever/flu sx-NAD-steady gait 

## 2020-04-28 MED ORDER — NAPROXEN 375 MG PO TABS
ORAL_TABLET | ORAL | 0 refills | Status: DC
Start: 2020-04-28 — End: 2021-06-03

## 2020-04-28 MED ORDER — KETOROLAC TROMETHAMINE 15 MG/ML IJ SOLN
15.0000 mg | Freq: Once | INTRAMUSCULAR | Status: AC
  Administered 2020-04-28: 15 mg via INTRAVENOUS
  Filled 2020-04-28: qty 1

## 2020-05-19 ENCOUNTER — Emergency Department (HOSPITAL_BASED_OUTPATIENT_CLINIC_OR_DEPARTMENT_OTHER)
Admission: EM | Admit: 2020-05-19 | Discharge: 2020-05-19 | Disposition: A | Discharge status: Home / Self Care | Payer: Commercial Managed Care - PPO | Attending: Emergency Medicine | Admitting: Emergency Medicine

## 2020-05-19 ENCOUNTER — Other Ambulatory Visit: Payer: Self-pay

## 2020-05-19 ENCOUNTER — Encounter (HOSPITAL_BASED_OUTPATIENT_CLINIC_OR_DEPARTMENT_OTHER): Payer: Self-pay | Admitting: *Deleted

## 2020-05-19 DIAGNOSIS — R197 Diarrhea, unspecified: Secondary | ICD-10-CM | POA: Diagnosis present

## 2020-05-19 DIAGNOSIS — R202 Paresthesia of skin: Secondary | ICD-10-CM | POA: Insufficient documentation

## 2020-05-19 DIAGNOSIS — Z79899 Other long term (current) drug therapy: Secondary | ICD-10-CM | POA: Insufficient documentation

## 2020-05-19 DIAGNOSIS — Z8616 Personal history of COVID-19: Secondary | ICD-10-CM | POA: Insufficient documentation

## 2020-05-19 DIAGNOSIS — I10 Essential (primary) hypertension: Secondary | ICD-10-CM | POA: Insufficient documentation

## 2020-05-19 LAB — URINALYSIS, ROUTINE W REFLEX MICROSCOPIC
Bilirubin Urine: NEGATIVE
Glucose, UA: NEGATIVE mg/dL
Hgb urine dipstick: NEGATIVE
Ketones, ur: NEGATIVE mg/dL
Leukocytes,Ua: NEGATIVE
Nitrite: NEGATIVE
Protein, ur: NEGATIVE mg/dL
Specific Gravity, Urine: 1.02 (ref 1.005–1.030)
pH: 6 (ref 5.0–8.0)

## 2020-05-19 LAB — COMPREHENSIVE METABOLIC PANEL
ALT: 13 U/L (ref 0–44)
AST: 19 U/L (ref 15–41)
Albumin: 4.1 g/dL (ref 3.5–5.0)
Alkaline Phosphatase: 43 U/L (ref 38–126)
Anion gap: 9 (ref 5–15)
BUN: 10 mg/dL (ref 6–20)
CO2: 27 mmol/L (ref 22–32)
Calcium: 8.9 mg/dL (ref 8.9–10.3)
Chloride: 102 mmol/L (ref 98–111)
Creatinine, Ser: 0.74 mg/dL (ref 0.44–1.00)
GFR calc Af Amer: >60 mL/min (ref >60)
GFR calc non Af Amer: >60 mL/min (ref >60)
Glucose, Bld: 120 mg/dL — ABNORMAL HIGH (ref 70–99)
Potassium: 3.7 mmol/L (ref 3.5–5.1)
Sodium: 138 mmol/L (ref 135–145)
Total Bilirubin: 1.1 mg/dL (ref 0.3–1.2)
Total Protein: 7.4 g/dL (ref 6.5–8.1)

## 2020-05-19 LAB — CBC WITH DIFFERENTIAL/PLATELET
Abs Immature Granulocytes: 0 10*3/uL (ref 0.00–0.07)
Basophils Absolute: 0 10*3/uL (ref 0.0–0.1)
Basophils Relative: 0 %
Eosinophils Absolute: 0.1 10*3/uL (ref 0.0–0.5)
Eosinophils Relative: 1 %
HCT: 37.7 % (ref 36.0–46.0)
Hemoglobin: 13 g/dL (ref 12.0–15.0)
Immature Granulocytes: 0 %
Lymphocytes Relative: 44 %
Lymphs Abs: 2.5 10*3/uL (ref 0.7–4.0)
MCH: 31.3 pg (ref 26.0–34.0)
MCHC: 34.5 g/dL (ref 30.0–36.0)
MCV: 90.6 fL (ref 80.0–100.0)
Monocytes Absolute: 0.4 10*3/uL (ref 0.1–1.0)
Monocytes Relative: 6 %
Neutro Abs: 2.8 10*3/uL (ref 1.7–7.7)
Neutrophils Relative %: 49 %
Platelets: 361 10*3/uL (ref 150–400)
RBC: 4.16 MIL/uL (ref 3.87–5.11)
RDW: 11.6 % (ref 11.5–15.5)
WBC: 5.8 10*3/uL (ref 4.0–10.5)
nRBC: 0 % (ref 0.0–0.2)

## 2020-05-19 LAB — LIPASE, BLOOD: Lipase: 29 U/L (ref 11–51)

## 2020-05-19 LAB — PREGNANCY, URINE: Preg Test, Ur: NEGATIVE

## 2020-05-19 NOTE — ED Provider Notes (Signed)
[facility] EMERGENCY DEPARTMENT Provider Note   CSN: [id number] Arrival date & time: 05/19/20  1746     History Chief Complaint  Patient presents with  . Diarrhea    Stacey Reid is a 24 y.o. female.  Pt presents to the ED today with diarrhea and some facial tingling and gum tingling.  She has had sx for about 4 days.  She denies any f/c.  She did have Covid in Jan.  No vaccine.  Her son is here as well with a cough.        Past Medical History:  Diagnosis Date  . Anemia affecting pregnancy 03/21/2017  . Asthma    years ago, exercise induced  . Headache    with pregnancy  . Heart murmur    history of, resolved 3 months later  . Hyperemesis 2016  . Hypertension   . Infection    UTI  . Pregnancy induced hypertension   . Syncope    in high school, found out had a heart murmur    Patient Active Problem List   Diagnosis Date Noted  . Post-operative state 07/16/2017  . Gestational hypertension 06/25/2017  . Sickle cell trait ([facility]) 11/13/2016  . Low vitamin D level 11/13/2016  . Asthma exacerbation, mild 11/05/2016    Past Surgical History:  Procedure Laterality Date  . LAPAROSCOPIC TUBAL LIGATION Bilateral 07/02/2017   Procedure: LAPAROSCOPIC TUBAL LIGATION - Filshie clips;  Surgeon: Chancy Milroy, MD;  Location: [facility];  Service: Gynecology;  Laterality: Bilateral;  . TUBAL LIGATION    . WISDOM TOOTH EXTRACTION       OB History    Gravida  3   Para  2   Term  2   Preterm  0   AB  1   Living  2     SAB  1   TAB  0   Ectopic  0   Multiple  0   Live Births  2           Family History  Problem Relation Age of Onset  . Asthma Mother   . Hypertension Mother   . Miscarriages / [place] Mother   . Healthy Father     Social History   Tobacco Use  . Smoking status: Former Research scientist (life sciences)  . Smokeless tobacco: Never Used  Substance Use Topics  . Alcohol use: Yes    Comment: occ.  . Drug use: No    Home  Medications Prior to Admission medications   Medication Sig Start Date End Date Taking? Authorizing Provider  albuterol (VENTOLIN HFA) 108 (90 Base) MCG/ACT inhaler Inhale 1-2 puffs into the lungs every 6 (six) hours as needed for shortness of breath. 09/15/19   [provider]  FLUoxetine (PROZAC) 10 MG capsule Take 10 mg by mouth 2 (two) times daily. 02/04/20   [provider]  hydrOXYzine (ATARAX/VISTARIL) 25 MG tablet Take 25 mg by mouth 4 (four) times daily as needed for anxiety. 02/04/20   [provider]  ibuprofen (ADVIL) 100 MG/5ML suspension Take 600 mg by mouth every 4 (four) hours as needed for mild pain.    [provider]  Multiple Vitamin (MULTIVITAMIN WITH MINERALS) TABS tablet Take 1 tablet by mouth daily.    [provider]  naproxen (NAPROSYN) 375 MG tablet Take 1 tablet twice daily as needed for pain. 04/28/20   Molpus, John, MD  metoCLOPramide (REGLAN) 10 MG tablet Take 1 tablet (10 mg total) by mouth  every 6 (six) hours. Patient not taking: Reported on 03/16/2020 02/08/20 04/28/20  Anselm Pancoast, PA-C    Allergies    Patient has no known allergies.  Review of Systems   Review of Systems  Gastrointestinal: Positive for abdominal pain and diarrhea.  Neurological: Positive for numbness.  All other systems reviewed and are negative.   Physical Exam Updated Vital Signs BP 122/89   Pulse 67   Temp 98.5 F (36.9 C) (Oral)   Resp 16   Ht 5\' 3"  (1.6 m)   Wt 58.8 kg   LMP 04/06/2020   SpO2 100%   BMI 22.98 kg/m   Physical Exam Vitals and nursing note reviewed.  Constitutional:      Appearance: Normal appearance.  HENT:     Head: Normocephalic and atraumatic.     Right Ear: External ear normal.     Left Ear: External ear normal.     Nose: Nose normal.     Mouth/Throat:     Mouth: Mucous membranes are moist.     Pharynx: Oropharynx is clear.  Eyes:     Extraocular Movements: Extraocular movements intact.      Conjunctiva/sclera: Conjunctivae normal.     Pupils: Pupils are equal, round, and reactive to light.  Cardiovascular:     Rate and Rhythm: Normal rate and regular rhythm.     Pulses: Normal pulses.     Heart sounds: Normal heart sounds.  Pulmonary:     Effort: Pulmonary effort is normal.     Breath sounds: Normal breath sounds.  Abdominal:     General: Abdomen is flat. Bowel sounds are normal.     Palpations: Abdomen is soft.  Musculoskeletal:        General: Normal range of motion.     Cervical back: Normal range of motion and neck supple.  Skin:    General: Skin is warm.     Capillary Refill: Capillary refill takes less than 2 seconds.  Neurological:     General: No focal deficit present.     Mental Status: She is alert and oriented to person, place, and time.  Psychiatric:        Mood and Affect: Mood normal.        Behavior: Behavior normal.        Thought Content: Thought content normal.        Judgment: Judgment normal.     ED Results / Procedures / Treatments   Labs (all labs ordered are listed, but only abnormal results are displayed) Labs Reviewed  COMPREHENSIVE METABOLIC PANEL - Abnormal; Notable for the following components:      Result Value   Glucose, Bld 120 (*)    All other components within normal limits  CBC WITH DIFFERENTIAL/PLATELET  LIPASE, BLOOD  URINALYSIS, ROUTINE W REFLEX MICROSCOPIC  PREGNANCY, URINE    EKG None  Radiology No results found.  Procedures Procedures (including critical care time)  Medications Ordered in ED Medications - No data to display  ED Course  I have reviewed the triage vital signs and the nursing notes.  Pertinent labs & imaging results that were available during my care of the patient were reviewed by me and considered in my medical decision making (see chart for details).    MDM Rules/Calculators/A&P                      Labs wnl.  Pt looks good.   She is stable for d/c.  Return  if worse. Final Clinical  Impression(s) / ED Diagnoses Final diagnoses:  Diarrhea, unspecified type    Rx / DC Orders ED Discharge Orders    None       Jacalyn Lefevre, MD 05/19/20 2021

## 2020-05-19 NOTE — ED Triage Notes (Signed)
Diarrhea x 4 days. Abdominal  and epigastric pain. Pain in her left lower leg. Gums were tingling while at work yesterday.

## 2020-06-18 ENCOUNTER — Encounter (HOSPITAL_BASED_OUTPATIENT_CLINIC_OR_DEPARTMENT_OTHER): Payer: Self-pay | Admitting: Emergency Medicine

## 2020-06-18 ENCOUNTER — Emergency Department (HOSPITAL_BASED_OUTPATIENT_CLINIC_OR_DEPARTMENT_OTHER): Payer: Commercial Managed Care - PPO

## 2020-06-18 ENCOUNTER — Other Ambulatory Visit: Payer: Self-pay

## 2020-06-18 ENCOUNTER — Emergency Department (HOSPITAL_BASED_OUTPATIENT_CLINIC_OR_DEPARTMENT_OTHER)
Admission: EM | Admit: 2020-06-18 | Discharge: 2020-06-18 | Disposition: A | Discharge status: Home / Self Care | Payer: Commercial Managed Care - PPO | Attending: Emergency Medicine | Admitting: Emergency Medicine

## 2020-06-18 DIAGNOSIS — Z87891 Personal history of nicotine dependence: Secondary | ICD-10-CM | POA: Diagnosis not present

## 2020-06-18 DIAGNOSIS — R0789 Other chest pain: Secondary | ICD-10-CM

## 2020-06-18 DIAGNOSIS — J45909 Unspecified asthma, uncomplicated: Secondary | ICD-10-CM | POA: Insufficient documentation

## 2020-06-18 DIAGNOSIS — K5900 Constipation, unspecified: Secondary | ICD-10-CM | POA: Insufficient documentation

## 2020-06-18 DIAGNOSIS — Z79899 Other long term (current) drug therapy: Secondary | ICD-10-CM | POA: Insufficient documentation

## 2020-06-18 DIAGNOSIS — R52 Pain, unspecified: Secondary | ICD-10-CM

## 2020-06-18 DIAGNOSIS — I10 Essential (primary) hypertension: Secondary | ICD-10-CM | POA: Diagnosis not present

## 2020-06-18 DIAGNOSIS — R42 Dizziness and giddiness: Secondary | ICD-10-CM | POA: Insufficient documentation

## 2020-06-18 LAB — CBC WITH DIFFERENTIAL/PLATELET
Abs Immature Granulocytes: 0.01 10*3/uL (ref 0.00–0.07)
Basophils Absolute: 0 10*3/uL (ref 0.0–0.1)
Basophils Relative: 1 %
Eosinophils Absolute: 0.1 10*3/uL (ref 0.0–0.5)
Eosinophils Relative: 1 %
HCT: 38.7 % (ref 36.0–46.0)
Hemoglobin: 12.9 g/dL (ref 12.0–15.0)
Immature Granulocytes: 0 %
Lymphocytes Relative: 54 %
Lymphs Abs: 1.9 10*3/uL (ref 0.7–4.0)
MCH: 30.5 pg (ref 26.0–34.0)
MCHC: 33.3 g/dL (ref 30.0–36.0)
MCV: 91.5 fL (ref 80.0–100.0)
Monocytes Absolute: 0.4 10*3/uL (ref 0.1–1.0)
Monocytes Relative: 11 %
Neutro Abs: 1.2 10*3/uL — ABNORMAL LOW (ref 1.7–7.7)
Neutrophils Relative %: 33 %
Platelets: 356 10*3/uL (ref 150–400)
RBC: 4.23 MIL/uL (ref 3.87–5.11)
RDW: 11.1 % — ABNORMAL LOW (ref 11.5–15.5)
WBC: 3.6 10*3/uL — ABNORMAL LOW (ref 4.0–10.5)
nRBC: 0 % (ref 0.0–0.2)

## 2020-06-18 LAB — URINALYSIS, ROUTINE W REFLEX MICROSCOPIC
Bilirubin Urine: NEGATIVE
Glucose, UA: NEGATIVE mg/dL
Hgb urine dipstick: NEGATIVE
Ketones, ur: NEGATIVE mg/dL
Leukocytes,Ua: NEGATIVE
Nitrite: NEGATIVE
Protein, ur: NEGATIVE mg/dL
Specific Gravity, Urine: 1.02 (ref 1.005–1.030)
pH: 7.5 (ref 5.0–8.0)

## 2020-06-18 LAB — COMPREHENSIVE METABOLIC PANEL
ALT: 13 U/L (ref 0–44)
AST: 17 U/L (ref 15–41)
Albumin: 3.9 g/dL (ref 3.5–5.0)
Alkaline Phosphatase: 41 U/L (ref 38–126)
Anion gap: 7 (ref 5–15)
BUN: 11 mg/dL (ref 6–20)
CO2: 25 mmol/L (ref 22–32)
Calcium: 8.8 mg/dL — ABNORMAL LOW (ref 8.9–10.3)
Chloride: 105 mmol/L (ref 98–111)
Creatinine, Ser: 0.59 mg/dL (ref 0.44–1.00)
GFR calc Af Amer: >60 mL/min (ref >60)
GFR calc non Af Amer: >60 mL/min (ref >60)
Glucose, Bld: 88 mg/dL (ref 70–99)
Potassium: 4.2 mmol/L (ref 3.5–5.1)
Sodium: 137 mmol/L (ref 135–145)
Total Bilirubin: 1.1 mg/dL (ref 0.3–1.2)
Total Protein: 7.2 g/dL (ref 6.5–8.1)

## 2020-06-18 LAB — LIPASE, BLOOD: Lipase: 29 U/L (ref 11–51)

## 2020-06-18 LAB — PREGNANCY, URINE: Preg Test, Ur: NEGATIVE

## 2020-06-18 MED ORDER — FAMOTIDINE 20 MG PO TABS
20.0000 mg | ORAL_TABLET | Freq: Every day | ORAL | 0 refills | Status: DC
Start: 2020-06-18 — End: 2021-06-03

## 2020-06-18 MED ORDER — ACETAMINOPHEN 500 MG PO TABS
1000.0000 mg | ORAL_TABLET | Freq: Once | ORAL | Status: DC
  Filled 2020-06-18: qty 2

## 2020-06-18 MED ORDER — DICLOFENAC SODIUM 1 % EX GEL: 2.0000 g | Freq: Four times a day (QID) | CUTANEOUS | 0 refills | Status: DC

## 2020-06-18 MED ORDER — ACETAMINOPHEN 160 MG/5ML PO SOLN
650.0000 mg | Freq: Once | ORAL | Status: AC
  Administered 2020-06-18: 650 mg via ORAL
  Filled 2020-06-18: qty 20.3

## 2020-06-18 MED ORDER — SODIUM CHLORIDE 0.9 % IV BOLUS
500.0000 mL | Freq: Once | INTRAVENOUS | Status: AC
  Administered 2020-06-18: 500 mL via INTRAVENOUS

## 2020-06-18 NOTE — ED Provider Notes (Signed)
[facility]ERGENCY DEPARTMENT Provider Note   CSN: [id number] Arrival date & time: 06/18/20  1054     History Chief Complaint  Patient presents with  . Dizziness    Stacey Reid is a 24 y.o. female presenting for evaluation of multiple symptoms.  Patient states her symptoms began 2 to 3 days ago.  She reports bilateral upper and lower chest pain, nausea, and crampy left-sided abdominal pain.  She reports generalized abdominal aches and a headache.  Yesterday she had an episode where she felt dizzy and lightheaded, improved when she sat down.  She also reports several bruises on bilateral upper legs.  Patient states for the past several weeks to months she has had intermittent waking up gasping for air and feeling her heart is racing, this has worsened over the past 2 days.  She also reports issues with constipation, feels like her last good bowel movement was a week and a half ago, however she did have a small bowel movement this morning.  She is still passing gas.  She has followed up with her PCP about this, was given a sample medication which helps with her constipation, but she does not have any more this anymore.  She is not sure what the medicine was.  She denies recent fevers, chills, current shortness of breath, vomiting, urinary symptoms, vaginal discharge.  She denies recent travel, surgeries, mobilization, history of cancer, history of previous DVT/PE, or hormone use.  Pt states she has had overall poor health since her Covid diagnosis in January 2021.  HPI     Past Medical History:  Diagnosis Date  . Anemia affecting pregnancy 03/21/2017  . Asthma    years ago, exercise induced  . Headache    with pregnancy  . Heart murmur    history of, resolved 3 months later  . Hyperemesis 2016  . Hypertension   . Infection    UTI  . Pregnancy induced hypertension   . Syncope    in high school, found out had a heart murmur    Patient Active Problem List    Diagnosis Date Noted  . Post-operative state 07/16/2017  . Gestational hypertension 06/25/2017  . Sickle cell trait ([facility]) 11/13/2016  . Low vitamin D level 11/13/2016  . Asthma exacerbation, mild 11/05/2016    Past Surgical History:  Procedure Laterality Date  . LAPAROSCOPIC TUBAL LIGATION Bilateral 07/02/2017   Procedure: LAPAROSCOPIC TUBAL LIGATION - Filshie clips;  Surgeon: Chancy Milroy, MD;  Location: [facility];  Service: Gynecology;  Laterality: Bilateral;  . TUBAL LIGATION    . WISDOM TOOTH EXTRACTION       OB History    Gravida  3   Para  2   Term  2   Preterm  0   AB  1   Living  2     SAB  1   TAB  0   Ectopic  0   Multiple  0   Live Births  2           Family History  Problem Relation Age of Onset  . Asthma Mother   . Hypertension Mother   . Miscarriages / [place] Mother   . Healthy Father     Social History   Tobacco Use  . Smoking status: Former Research scientist (life sciences)  . Smokeless tobacco: Never Used  Vaping Use  . Vaping Use: Never used  Substance Use Topics  . Alcohol use: Yes    Comment: occ.  Marland Kitchen  Drug use: No    Home Medications Prior to Admission medications   Medication Sig Start Date End Date Taking? Authorizing Provider  albuterol (VENTOLIN HFA) 108 (90 Base) MCG/ACT inhaler Inhale 1-2 puffs into the lungs every 6 (six) hours as needed for shortness of breath. 09/15/19   [provider]  diclofenac Sodium (VOLTAREN) 1 % GEL Apply 2 g topically 4 (four) times daily. 06/18/20   Nashia Remus, PA-C  famotidine (PEPCID) 20 MG tablet Take 1 tablet (20 mg total) by mouth daily. 06/18/20   Kadrian Partch, PA-C  FLUoxetine (PROZAC) 10 MG capsule Take 10 mg by mouth 2 (two) times daily. 02/04/20   [provider]  hydrOXYzine (ATARAX/VISTARIL) 25 MG tablet Take 25 mg by mouth 4 (four) times daily as needed for anxiety. 02/04/20   [provider]  ibuprofen (ADVIL) 100 MG/5ML suspension Take 600 mg by mouth every 4  (four) hours as needed for mild pain.    [provider]  Multiple Vitamin (MULTIVITAMIN WITH MINERALS) TABS tablet Take 1 tablet by mouth daily.    [provider]  naproxen (NAPROSYN) 375 MG tablet Take 1 tablet twice daily as needed for pain. 04/28/20   Molpus, John, MD  metoCLOPramide (REGLAN) 10 MG tablet Take 1 tablet (10 mg total) by mouth every 6 (six) hours. Patient not taking: Reported on 03/16/2020 02/08/20 04/28/20  Anselm Pancoast, PA-C    Allergies    Patient has no known allergies.  Review of Systems   Review of Systems  Respiratory: Positive for shortness of breath (At night, none currently).   Cardiovascular: Positive for chest pain (Bilateral in upper and lower chest, present with palpation).  Gastrointestinal: Positive for constipation.  Musculoskeletal: Positive for myalgias.  All other systems reviewed and are negative.   Physical Exam Updated Vital Signs BP 113/74 (BP Location: Left Arm)   Pulse 68   Temp 98.9 F (37.2 C) (Oral)   Resp 18   Ht 5\' 3"  (1.6 m)   Wt 59 kg   LMP 06/06/2020   SpO2 100%   BMI 23.03 kg/m   Physical Exam Vitals and nursing note reviewed.  Constitutional:      General: She is not in acute distress.    Appearance: She is well-developed.     Comments: Resting in the bed in NAD  HENT:     Head: Normocephalic and atraumatic.  Eyes:     Extraocular Movements: Extraocular movements intact.     Conjunctiva/sclera: Conjunctivae normal.     Pupils: Pupils are equal, round, and reactive to light.  Cardiovascular:     Rate and Rhythm: Normal rate and regular rhythm.     Pulses: Normal pulses.  Pulmonary:     Effort: Pulmonary effort is normal. No respiratory distress.     Breath sounds: Normal breath sounds. No wheezing.     Comments: Clear lung sounds. ttp of the anterior upper and lower chest walls. Speaking in full sentences.  Chest:     Chest wall: Tenderness present.  Abdominal:     General: There is no  distension.     Palpations: Abdomen is soft. There is no mass.     Tenderness: There is no abdominal tenderness. There is no guarding or rebound.     Comments: No ttp of the abd  Musculoskeletal:        General: Normal range of motion.     Cervical back: Normal range of motion and neck supple.  Right lower leg: No edema.     Left lower leg: No edema.     Comments: No leg pain or swelling  Skin:    General: Skin is warm and dry.     Capillary Refill: Capillary refill takes less than 2 seconds.     Comments: 3 dime sized mild bruises of upper legs without tenderness.   Neurological:     Mental Status: She is alert and oriented to person, place, and time.     ED Results / Procedures / Treatments   Labs (all labs ordered are listed, but only abnormal results are displayed) Labs Reviewed  CBC WITH DIFFERENTIAL/PLATELET - Abnormal; Notable for the following components:      Result Value   WBC 3.6 (*)    RDW 11.1 (*)    Neutro Abs 1.2 (*)    All other components within normal limits  COMPREHENSIVE METABOLIC PANEL - Abnormal; Notable for the following components:   Calcium 8.8 (*)    All other components within normal limits  URINALYSIS, ROUTINE W REFLEX MICROSCOPIC - Abnormal; Notable for the following components:   APPearance HAZY (*)    All other components within normal limits  LIPASE, BLOOD  PREGNANCY, URINE    EKG EKG Interpretation  Date/Time:  Saturday June 18 2020 11:11:33 EDT Ventricular Rate:  71 PR Interval:    QRS Duration: 81 QT Interval:  388 QTC Calculation: 422 R Axis:   33 Text Interpretation: Sinus rhythm Confirmed by Virgina Norfolk [phone]) on 06/18/2020 11:18:29 AM   Radiology DG Chest 2 View  Result Date: 06/18/2020 CLINICAL DATA:  Shortness of breath, palpitations, dizziness today, pain, history of asthma, had COVID-19 in January 2021 EXAM: CHEST - 2 VIEW COMPARISON:  04/27/2020 FINDINGS: Normal heart size, mediastinal contours, and pulmonary  vascularity. Lungs clear. No pleural effusion or pneumothorax. Bones unremarkable. IMPRESSION: Normal exam. Electronically Signed   By: Ulyses Southward M.D.   On: 06/18/2020 13:32    Procedures Procedures (including critical care time)  Medications Ordered in ED Medications  sodium chloride 0.9 % bolus 500 mL ( Intravenous Stopped 06/18/20 1305)  acetaminophen (TYLENOL) 160 MG/5ML solution 650 mg (650 mg Oral Given 06/18/20 1332)    ED Course  I have reviewed the triage vital signs and the nursing notes.  Pertinent labs & imaging results that were available during my care of the patient were reviewed by me and considered in my medical decision making (see chart for details).    MDM Rules/Calculators/A&P                          Pt presenting for evaluation of multiple complaints. reports myalgias, HA, cp, and waking up feel like she is gasping for air and with palpitations.  On exam, patient appears nontoxic.  She has chest wall tenderness, likely external cause of chest pain.  I have very low suspicion for ACS, will obtain an EKG to rule out.  Patient with no risk factors for PE, and no shortness of breath currently with stable vital signs.  Doubt PE at this time, I do not believe she needs dimer or emergent CTA.  Will obtain lab work to check for anemia and electrolyte abnormality.  Waking up short of breath and with palpitations could be due to asthma, GERD, sleep apnea, or CHF.  Will obtain chest x-ray, if no fluid, treat for GERD.  Patient states is more consistent with her asthma.  Lightheadedness patient  experience was yesterday, none today.  It is worse when standing, improved with sitting.  Likely orthostatic.  Will check orthostatics today, give fluids.  Bruising appears scattered likely due to mild trauma.  Will check hemoglobin and platelets on blood work.   Labs interpreted by me, overall reassuring.  Hemoglobin stable.  Electrolytes are stable.  Platelets normal.  Mild leukopenia at  3.6, likely pointing to a viral cause of patient's symptoms.  Urine without signs of infection, pregnancy is negative.  Chest x-ray viewed interpreted by me, no pneumonia, pneumothorax, effusion, cardiomegaly. As such doubt CHF. Consider OSA (pt to f/u with PCP) or reflux (will tx with pepcid).  EKG shows normal sinus rhythm.  On reassessment, patient reports improvement of symptoms after fluids and Tylenol.  Discussed possible viral syndrome.  Discussed possible long Covid.  Discussed that at this time, there does not appear to be any acute, emergent, or life-threatening condition requiring hospitalization or further work-up.  Encourage symptomatic treatment and follow-up with PCP symptoms not improving.  At this time, patient appears safe for discharge.  Return precautions given.  Patient states she understands and agrees to plan.  Final Clinical Impression(s) / ED Diagnoses Final diagnoses:  Generalized body aches  Atypical chest pain  Constipation, unspecified constipation type    Rx / DC Orders ED Discharge Orders         Ordered    diclofenac Sodium (VOLTAREN) 1 % GEL  4 times daily     Discontinue  Reprint     06/18/20 1349    famotidine (PEPCID) 20 MG tablet  Daily     Discontinue  Reprint     06/18/20 1349           Oluwateniola Leitch, PA-C 06/18/20 1539    Curatolo, Adam, DO 06/19/20 [phone]

## 2020-06-18 NOTE — ED Triage Notes (Signed)
Dizziness since yesterday with pain all over.

## 2020-06-18 NOTE — Discharge Instructions (Addendum)
Take Pepcid daily to decrease stomach acid. Make sure you are staying well-hydrated with water. Use MiraLAX daily until you are having regular bowel movements. Use Tylenol or ibuprofen as needed for pain. You may use Voltaren gel in your chest to help with pain. Follow-up with your primary care doctor if your symptoms are not improving in the next several days. Return to the emergency room if you develop high fevers, difficulty breathing, or any new, worsening, or concerning symptoms.

## 2020-10-10 ENCOUNTER — Emergency Department (HOSPITAL_BASED_OUTPATIENT_CLINIC_OR_DEPARTMENT_OTHER)
Admission: EM | Admit: 2020-10-10 | Discharge: 2020-10-10 | Disposition: A | Discharge status: Home / Self Care | Payer: Commercial Managed Care - PPO | Attending: Emergency Medicine | Admitting: Emergency Medicine

## 2020-10-10 ENCOUNTER — Emergency Department (HOSPITAL_BASED_OUTPATIENT_CLINIC_OR_DEPARTMENT_OTHER): Payer: Commercial Managed Care - PPO

## 2020-10-10 ENCOUNTER — Encounter (HOSPITAL_BASED_OUTPATIENT_CLINIC_OR_DEPARTMENT_OTHER): Payer: Self-pay | Admitting: Emergency Medicine

## 2020-10-10 ENCOUNTER — Other Ambulatory Visit: Payer: Self-pay

## 2020-10-10 DIAGNOSIS — I10 Essential (primary) hypertension: Secondary | ICD-10-CM | POA: Insufficient documentation

## 2020-10-10 DIAGNOSIS — M25561 Pain in right knee: Secondary | ICD-10-CM | POA: Insufficient documentation

## 2020-10-10 DIAGNOSIS — Z87891 Personal history of nicotine dependence: Secondary | ICD-10-CM | POA: Insufficient documentation

## 2020-10-10 DIAGNOSIS — Z79899 Other long term (current) drug therapy: Secondary | ICD-10-CM | POA: Diagnosis not present

## 2020-10-10 DIAGNOSIS — J4521 Mild intermittent asthma with (acute) exacerbation: Secondary | ICD-10-CM | POA: Insufficient documentation

## 2020-10-10 NOTE — ED Provider Notes (Signed)
[facility] EMERGENCY DEPARTMENT Provider Note   CSN: [id number] Arrival date & time: 10/10/20  1057     History Chief Complaint  Patient presents with  . Leg Pain    Stacey Reid is a 24 y.o. female.  HPI Patient presents with right knee pain.  Is had for last few days.  States he will sometimes get pain in both lower legs but is been in the right lower extremity recently.  States at work today she had an episode where the knee hurt more and she had difficulty moving it.  States there was a swelling on the front of the knee.  That is since resolved.  States she could see it through her pants.  States difficulty moving the knee due to the pain.  No trauma.  Knee is feeling somewhat better now.  No swelling in the legs.    Past Medical History:  Diagnosis Date  . Anemia affecting pregnancy 03/21/2017  . Asthma    years ago, exercise induced  . Headache    with pregnancy  . Heart murmur    history of, resolved 3 months later  . Hyperemesis 2016  . Hypertension   . Infection    UTI  . Pregnancy induced hypertension   . Syncope    in high school, found out had a heart murmur    Patient Active Problem List   Diagnosis Date Noted  . Post-operative state 07/16/2017  . Gestational hypertension 06/25/2017  . Sickle cell trait ([facility]) 11/13/2016  . Low vitamin D level 11/13/2016  . Asthma exacerbation, mild 11/05/2016    Past Surgical History:  Procedure Laterality Date  . LAPAROSCOPIC TUBAL LIGATION Bilateral 07/02/2017   Procedure: LAPAROSCOPIC TUBAL LIGATION - Filshie clips;  Surgeon: Hermina Staggers, MD;  Location: [facility];  Service: Gynecology;  Laterality: Bilateral;  . TUBAL LIGATION    . WISDOM TOOTH EXTRACTION       OB History    Gravida  3   Para  2   Term  2   Preterm  0   AB  1   Living  2     SAB  1   TAB  0   Ectopic  0   Multiple  0   Live Births  2           Family History  Problem Relation Age of Onset  .  Asthma Mother   . Hypertension Mother   . Miscarriages / [place] Mother   . Healthy Father     Social History   Tobacco Use  . Smoking status: Former Games developer  . Smokeless tobacco: Never Used  Vaping Use  . Vaping Use: Never used  Substance Use Topics  . Alcohol use: Yes    Comment: occ.  . Drug use: No    Home Medications Prior to Admission medications   Medication Sig Start Date End Date Taking? Authorizing Provider  albuterol (VENTOLIN HFA) 108 (90 Base) MCG/ACT inhaler Inhale 1-2 puffs into the lungs every 6 (six) hours as needed for shortness of breath. 09/15/19   [provider]  diclofenac Sodium (VOLTAREN) 1 % GEL Apply 2 g topically 4 (four) times daily. 06/18/20   Caccavale, Sophia, PA-C  famotidine (PEPCID) 20 MG tablet Take 1 tablet (20 mg total) by mouth daily. 06/18/20   Caccavale, Sophia, PA-C  FLUoxetine (PROZAC) 10 MG capsule Take 10 mg by mouth 2 (two) times daily. 02/04/20   [provider]  hydrOXYzine (ATARAX/VISTARIL) 25 MG tablet Take 25 mg by mouth 4 (four) times daily as needed for anxiety. 02/04/20   [provider]  ibuprofen (ADVIL) 100 MG/5ML suspension Take 600 mg by mouth every 4 (four) hours as needed for mild pain.    [provider]  Multiple Vitamin (MULTIVITAMIN WITH MINERALS) TABS tablet Take 1 tablet by mouth daily.    [provider]  naproxen (NAPROSYN) 375 MG tablet Take 1 tablet twice daily as needed for pain. 04/28/20   Molpus, John, MD  metoCLOPramide (REGLAN) 10 MG tablet Take 1 tablet (10 mg total) by mouth every 6 (six) hours. Patient not taking: Reported on 03/16/2020 02/08/20 04/28/20  Anselm Pancoast, PA-C    Allergies    Patient has no known allergies.  Review of Systems   Review of Systems  Constitutional: Negative for appetite change.  Respiratory: Negative for shortness of breath.   Cardiovascular: Negative for chest pain.  Musculoskeletal:       Right knee pain  Skin: Negative for rash.   Neurological: Negative for weakness and numbness.  Psychiatric/Behavioral: Negative for confusion.    Physical Exam Updated Vital Signs BP 122/76   Pulse 77   Temp 99.2 F (37.3 C) (Oral)   Resp 17   Ht 5\' 3"  (1.6 m)   Wt 60.8 kg   LMP 09/19/2020   SpO2 98%   BMI 23.74 kg/m   Physical Exam Vitals and nursing note reviewed.  Cardiovascular:     Rate and Rhythm: Regular rhythm.  Abdominal:     Tenderness: There is no abdominal tenderness.  Musculoskeletal:     Cervical back: Neck supple.     Comments: Right knee stable.  Patella stable.  Good range of motion.  No peripheral edema.  Lower extremities warm.  Skin:    General: Skin is warm.     Capillary Refill: Capillary refill takes less than 2 seconds.  Neurological:     Mental Status: She is alert and oriented to person, place, and time.     ED Results / Procedures / Treatments   Labs (all labs ordered are listed, but only abnormal results are displayed) Labs Reviewed - No data to display  EKG None  Radiology DG Knee Complete 4 Views Right  Result Date: 10/10/2020 CLINICAL DATA:  R leg pain from the knee down for several days. Denies injury. Pt states that she saw a knot pop up and go down on her right leg early this am knee pain EXAM: RIGHT KNEE - COMPLETE 4+ VIEW COMPARISON:  None. FINDINGS: No fracture of the proximal tibia or distal femur. Patella is normal. No joint effusion. IMPRESSION: No acute findings of the knee.  No joint effusion Electronically Signed   By: 10/12/2020 M.D.   On: 10/10/2020 12:48    Procedures Procedures (including critical care time)  Medications Ordered in ED Medications - No data to display  ED Course  I have reviewed the triage vital signs and the nursing notes.  Pertinent labs & imaging results that were available during my care of the patient were reviewed by me and considered in my medical decision making (see chart for details).    MDM Rules/Calculators/A&P                           Patient with lower extremity pain.  Reportedly was on the right knee with worse the pain is.  States it was a  swelling on the anterior aspect appears to be just inferior to the patella.  No swelling now.  Good range of motion.  Patella appears intact although potentially had a subluxation dislocation that has resolved.  No effusion of the knee.  X-ray reassuring.  Pain is improved from prior.  Will discharge home to follow-up with sports medicine as needed.  X-ray reviewed by myself.  Doubt DVT as a cause. Final Clinical Impression(s) / ED Diagnoses Final diagnoses:  Acute pain of right knee    Rx / DC Orders ED Discharge Orders    None       Benjiman Core, MD 10/10/20 1354

## 2020-10-10 NOTE — ED Triage Notes (Signed)
R leg pain from the knee down for several days. Denies injury.

## 2020-10-10 NOTE — Discharge Instructions (Addendum)
Follow-up with sports medicine to help further evaluate the lower extremity/knee pain.

## 2020-10-26 ENCOUNTER — Other Ambulatory Visit: Payer: Self-pay

## 2020-10-26 ENCOUNTER — Ambulatory Visit: Payer: Self-pay

## 2020-10-26 ENCOUNTER — Encounter: Payer: Self-pay | Admitting: Family Medicine

## 2020-10-26 ENCOUNTER — Ambulatory Visit (INDEPENDENT_AMBULATORY_CARE_PROVIDER_SITE_OTHER): Payer: Commercial Managed Care - PPO | Admitting: Family Medicine

## 2020-10-26 VITALS — BP 117/78 | HR 76 | Ht 63.0 in | Wt 134.0 lb

## 2020-10-26 DIAGNOSIS — M25561 Pain in right knee: Secondary | ICD-10-CM

## 2020-10-26 DIAGNOSIS — M659 Synovitis and tenosynovitis, unspecified: Secondary | ICD-10-CM

## 2020-10-26 DIAGNOSIS — M65969 Unspecified synovitis and tenosynovitis, unspecified lower leg: Secondary | ICD-10-CM

## 2020-10-26 MED ORDER — PREDNISONE 5 MG PO TABS: ORAL_TABLET | ORAL | 0 refills | Status: DC

## 2020-10-26 NOTE — Progress Notes (Signed)
Stacey Reid - 24 y.o. female MRN [id number]  Date of birth: November 07, 1996  SUBJECTIVE:  Including CC & ROS.  Chief Complaint  Patient presents with  . Leg Pain    right    Stacey Reid is a 24 y.o. female that is presenting with left and right knee pain.  The right knee is worse than the left.  She denies any specific inciting event.  She did have swelling and stiffness of the knee.  No history of surgery or injury.  Does not had similar pain.  No significant family history..  Independent review of the right knee x-ray from 10/18 shows no acute abnormalities.   Review of Systems See HPI   HISTORY: Past Medical, Surgical, Social, and Family History Reviewed & Updated per EMR.   Pertinent Historical Findings include:  Past Medical History:  Diagnosis Date  . Anemia affecting pregnancy 03/21/2017  . Asthma    years ago, exercise induced  . Headache    with pregnancy  . Heart murmur    history of, resolved 3 months later  . Hyperemesis 2016  . Hypertension   . Infection    UTI  . Pregnancy induced hypertension   . Syncope    in high school, found out had a heart murmur    Past Surgical History:  Procedure Laterality Date  . LAPAROSCOPIC TUBAL LIGATION Bilateral 07/02/2017   Procedure: LAPAROSCOPIC TUBAL LIGATION - Filshie clips;  Surgeon: Hermina Staggers, MD;  Location: [facility];  Service: Gynecology;  Laterality: Bilateral;  . TUBAL LIGATION    . WISDOM TOOTH EXTRACTION      Family History  Problem Relation Age of Onset  . Asthma Mother   . Hypertension Mother   . Miscarriages / [place] Mother   . Healthy Father     Social History   Socioeconomic History  . Marital status: Divorced    Spouse name: Not on file  . Number of children: Not on file  . Years of education: Not on file  . Highest education level: Not on file  Occupational History  . Not on file  Tobacco Use  . Smoking status: Former Games developer  . Smokeless tobacco: Never Used    Vaping Use  . Vaping Use: Never used  Substance and Sexual Activity  . Alcohol use: Yes    Comment: occ.  . Drug use: No  . Sexual activity: Not on file  Other Topics Concern  . Not on file  Social History Narrative  . Not on file   Social Determinants of Health   Financial Resource Strain:   . Difficulty of Paying Living Expenses: Not on file  Food Insecurity:   . Worried About Programme researcher, broadcasting/film/video in the Last Year: Not on file  . Ran Out of Food in the Last Year: Not on file  Transportation Needs:   . Lack of Transportation (Medical): Not on file  . Lack of Transportation (Non-Medical): Not on file  Physical Activity:   . Days of Exercise per Week: Not on file  . Minutes of Exercise per Session: Not on file  Stress:   . Feeling of Stress : Not on file  Social Connections:   . Frequency of Communication with Friends and Family: Not on file  . Frequency of Social Gatherings with Friends and Family: Not on file  . Attends Religious Services: Not on file  . Active Member of Clubs or Organizations: Not on file  . Attends Club or  Organization Meetings: Not on file  . Marital Status: Not on file  Intimate Partner Violence:   . Fear of Current or Ex-Partner: Not on file  . Emotionally Abused: Not on file  . Physically Abused: Not on file  . Sexually Abused: Not on file     PHYSICAL EXAM:  VS: BP 117/78   Pulse 76   Ht 5\' 3"  (1.6 m)   Wt 134 lb (60.8 kg)   BMI 23.74 kg/m  Physical Exam Gen: NAD, alert, cooperative with exam, well-appearing MSK:  Right and left knee: No obvious effusion. Normal range of motion. Normal strength resistance. No instability. Negative McMurray's test. Neurovascularly intact  Limited ultrasound: Right knee, left knee:  Right knee: The synovium of the suprapatellar pouch appears to be thickened with associated hyperemia. Normal-appearing quadricep and patellar tendon. Normal-appearing medial joint space and  meniscus. Normal-appearing lateral joint space and meniscus.  Left knee: Trace effusion. Normal-appearing quadricep patellar tendon. Normal-appearing medial meniscus and joint space  Summary: Findings suggest a synovitis of each knee  Ultrasound and interpretation by , MD    ASSESSMENT & PLAN:   Synovitis of knee Symptoms seem more suggestive of synovitis with inflammatory type changes on ultrasound.  No significant degenerative changes or meniscus changes. -Counseled on home exercise therapy and supportive care. -Prednisone. -Provided pennsaid samples  -Could consider lab work

## 2020-10-26 NOTE — Patient Instructions (Signed)
Nice to meet you Please try the prednisone  You can use the pennsaid after the prednisone  Please try the exercises  Please try ice   Please send me a message in MyChart with any questions or updates.  Please see me back in 3-4 weeks .   --Dr. Jordan Likes

## 2020-10-26 NOTE — Progress Notes (Signed)
Medication Samples have been provided to the patient.  Drug name: Pennsaid       Strength: 2%        Qty: 1 Box  LOT: [id number]  Exp.Date: 02/2021  Dosing instructions: Use a pea size amount and rub gently.  The patient has been instructed regarding the correct time, dose, and frequency of taking this medication, including desired effects and most common side effects.   Kathi Simpers, [place] 4:49 PM 10/26/2020

## 2020-10-27 DIAGNOSIS — M659 Synovitis and tenosynovitis, unspecified: Secondary | ICD-10-CM | POA: Insufficient documentation

## 2020-10-27 DIAGNOSIS — M222X2 Patellofemoral disorders, left knee: Secondary | ICD-10-CM | POA: Insufficient documentation

## 2020-10-27 NOTE — Assessment & Plan Note (Addendum)
Symptoms seem more suggestive of synovitis with inflammatory type changes on ultrasound.  No significant degenerative changes or meniscus changes. -Counseled on home exercise therapy and supportive care. -Prednisone. -Provided pennsaid samples  -Could consider lab work

## 2020-11-09 ENCOUNTER — Encounter: Payer: Self-pay | Admitting: Diagnostic Neuroimaging

## 2020-11-09 ENCOUNTER — Encounter: Payer: Self-pay | Admitting: *Deleted

## 2020-11-09 ENCOUNTER — Other Ambulatory Visit: Payer: Self-pay

## 2020-11-09 ENCOUNTER — Ambulatory Visit (INDEPENDENT_AMBULATORY_CARE_PROVIDER_SITE_OTHER): Payer: Commercial Managed Care - PPO | Admitting: Diagnostic Neuroimaging

## 2020-11-09 VITALS — BP 118/79 | HR 72 | Ht 63.0 in | Wt 138.0 lb

## 2020-11-09 DIAGNOSIS — R202 Paresthesia of skin: Secondary | ICD-10-CM | POA: Diagnosis not present

## 2020-11-09 DIAGNOSIS — M542 Cervicalgia: Secondary | ICD-10-CM

## 2020-11-09 DIAGNOSIS — R200 Anesthesia of skin: Secondary | ICD-10-CM | POA: Diagnosis not present

## 2020-11-09 NOTE — Progress Notes (Signed)
[facility]  PATIENT: Stacey Reid DOB: [birth year]  REFERRING CLINICIAN: Burnice Logan, PA HISTORY FROM: patient  REASON FOR VISIT: new consult    HISTORICAL  CHIEF COMPLAINT:  Chief Complaint  Patient presents with  . Numbness    rm 7 New Pt, "March 2021- numbness in both arms, feel heavy, occas my feet go numb as well; saw chiropractor x 3 months- pinched nerve in neck"    HISTORY OF PRESENT ILLNESS:   24 year old female here for evaluation of numbness, pain, shortness of breath.  January 2021 patient had Covid infection with cough, fevers, shortness of breath.  Within a few weeks she developed pain, numbness, heaviness in her neck, arms, legs, low back.  She also had ongoing shortness of breath and chest pains.  Patient had multiple and numerous ER evaluations for the symptoms.  Symptoms have continued until present time.    REVIEW OF SYSTEMS: Full 14 system review of systems performed and negative with exception of: As per HPI.  ALLERGIES: No Known Allergies  HOME MEDICATIONS: Outpatient Medications Prior to Visit  Medication Sig Dispense Refill  . albuterol (VENTOLIN HFA) 108 (90 Base) MCG/ACT inhaler Inhale 1-2 puffs into the lungs every 6 (six) hours as needed for shortness of breath.    . diclofenac Sodium (VOLTAREN) 1 % GEL Apply 2 g topically 4 (four) times daily. 100 g 0  . FLUoxetine (PROZAC) 10 MG capsule Take 10 mg by mouth 2 (two) times daily.    . famotidine (PEPCID) 20 MG tablet Take 1 tablet (20 mg total) by mouth daily. (Patient not taking: Reported on 11/09/2020) 30 tablet 0  . hydrOXYzine (ATARAX/VISTARIL) 25 MG tablet Take 25 mg by mouth 4 (four) times daily as needed for anxiety. (Patient not taking: Reported on 11/09/2020)    . ibuprofen (ADVIL) 100 MG/5ML suspension Take 600 mg by mouth every 4 (four) hours as needed for mild pain. (Patient not taking: Reported on 11/09/2020)    . Multiple Vitamin (MULTIVITAMIN WITH  MINERALS) TABS tablet Take 1 tablet by mouth daily. (Patient not taking: Reported on 11/09/2020)    . naproxen (NAPROSYN) 375 MG tablet Take 1 tablet twice daily as needed for pain. (Patient not taking: Reported on 11/09/2020) 20 tablet 0  . predniSONE (DELTASONE) 5 MG tablet Take 6 pills for first day, 5 pills second day, 4 pills third day, 3 pills fourth day, 2 pills the fifth day, and 1 pill sixth day. 21 tablet 0   No facility-administered medications prior to visit.    PAST MEDICAL HISTORY: Past Medical History:  Diagnosis Date  . Anemia affecting pregnancy 03/21/2017  . Asthma    years ago, exercise induced  . Headache    with pregnancy  . Heart murmur    history of, resolved 3 months later  . Hyperemesis 2016  . Hypertension   . Infection    UTI  . Pregnancy induced hypertension   . Syncope    in high school, found out had a heart murmur    PAST SURGICAL HISTORY: Past Surgical History:  Procedure Laterality Date  . LAPAROSCOPIC TUBAL LIGATION Bilateral 07/02/2017   Procedure: LAPAROSCOPIC TUBAL LIGATION - Filshie clips;  Surgeon: Hermina Staggers, MD;  Location: [facility];  Service: Gynecology;  Laterality: Bilateral;  . WISDOM TOOTH EXTRACTION      FAMILY HISTORY: Family History  Problem Relation Age of Onset  . Asthma Mother   . Hypertension Mother   . Miscarriages / [place] Mother   .  Depression Mother   . Lupus Mother   . Healthy Father     SOCIAL HISTORY: Social History   Socioeconomic History  . Marital status: Divorced    Spouse name: Not on file  . Number of children: 2  . Years of education: Not on file  . Highest education level: Some college, no degree  Occupational History    Comment: [employer]  Tobacco Use  . Smoking status: Never Smoker  . Smokeless tobacco: Never Used  Vaping Use  . Vaping Use: Never used  Substance and Sexual Activity  . Alcohol use: Yes    Comment: occ.  . Drug use: Not Currently    Types: Marijuana     Comment: quit 12/2019  . Sexual activity: Not on file  Other Topics Concern  . Not on file  Social History Narrative   Lives with 2 children   Caffeine- sodas, 3 daily   Social Determinants of Health   Financial Resource Strain:   . Difficulty of Paying Living Expenses: Not on file  Food Insecurity:   . Worried About Programme researcher, broadcasting/film/video in the Last Year: Not on file  . Ran Out of Food in the Last Year: Not on file  Transportation Needs:   . Lack of Transportation (Medical): Not on file  . Lack of Transportation (Non-Medical): Not on file  Physical Activity:   . Days of Exercise per Week: Not on file  . Minutes of Exercise per Session: Not on file  Stress:   . Feeling of Stress : Not on file  Social Connections:   . Frequency of Communication with Friends and Family: Not on file  . Frequency of Social Gatherings with Friends and Family: Not on file  . Attends Religious Services: Not on file  . Active Member of Clubs or Organizations: Not on file  . Attends Banker Meetings: Not on file  . Marital Status: Not on file  Intimate Partner Violence:   . Fear of Current or Ex-Partner: Not on file  . Emotionally Abused: Not on file  . Physically Abused: Not on file  . Sexually Abused: Not on file     PHYSICAL EXAM  GENERAL EXAM/CONSTITUTIONAL: Vitals:  Vitals:   11/09/20 1104  BP: 118/79  Pulse: 72  Weight: 138 lb (62.6 kg)  Height: 5\' 3"  (1.6 m)     Body mass index is 24.45 kg/m. Wt Readings from Last 3 Encounters:  11/09/20 138 lb (62.6 kg)  10/26/20 134 lb (60.8 kg)  10/10/20 134 lb (60.8 kg)     Patient is in no distress; well developed, nourished and groomed; neck is supple  CARDIOVASCULAR:  Examination of carotid arteries is normal; no carotid bruits  Regular rate and rhythm, no murmurs  Examination of peripheral vascular system by observation and palpation is normal  EYES:  Ophthalmoscopic exam of optic discs and posterior segments  is normal; no papilledema or hemorrhages  No exam data present  MUSCULOSKELETAL:  Gait, strength, tone, movements noted in Neurologic exam below  NEUROLOGIC: MENTAL STATUS:  No flowsheet data found.  awake, alert, oriented to person, place and time  recent and remote memory intact  normal attention and concentration  language fluent, comprehension intact, naming intact  fund of knowledge appropriate  CRANIAL NERVE:   2nd - no papilledema on fundoscopic exam  2nd, 3rd, 4th, 6th - pupils equal and reactive to light, visual fields full to confrontation, extraocular muscles intact, no nystagmus  5th -  facial sensation symmetric  7th - facial strength symmetric  8th - hearing intact  9th - palate elevates symmetrically, uvula midline  11th - shoulder shrug symmetric  12th - tongue protrusion midline  MOTOR:   normal bulk and tone, full strength in the BUE, BLE  SENSORY:   normal and symmetric to light touch, temperature, vibration  COORDINATION:   finger-nose-finger, fine finger movements normal  REFLEXES:   deep tendon reflexes present and symmetric  GAIT/STATION:   narrow based gait     DIAGNOSTIC DATA (LABS, IMAGING, TESTING) - I reviewed patient records, labs, notes, testing and imaging myself where available.  Lab Results  Component Value Date   WBC 3.6 (L) 06/18/2020   HGB 12.9 06/18/2020   HCT 38.7 06/18/2020   MCV 91.5 06/18/2020   PLT 356 06/18/2020      Component Value Date/Time   NA 137 06/18/2020 1228   K 4.2 06/18/2020 1228   CL 105 06/18/2020 1228   CO2 25 06/18/2020 1228   GLUCOSE 88 06/18/2020 1228   BUN 11 06/18/2020 1228   CREATININE 0.59 06/18/2020 1228   CALCIUM 8.8 (L) 06/18/2020 1228   PROT 7.2 06/18/2020 1228   ALBUMIN 3.9 06/18/2020 1228   AST 17 06/18/2020 1228   ALT 13 06/18/2020 1228   ALKPHOS 41 06/18/2020 1228   BILITOT 1.1 06/18/2020 1228   GFRNONAA >60 06/18/2020 1228   GFRAA >60 06/18/2020 1228    No results found for: CHOL, HDL, LDLCALC, LDLDIRECT, TRIG, CHOLHDL Lab Results  Component Value Date   HGBA1C 4.9 11/05/2016   No results found for: VITAMINB12 No results found for: TSH   02/08/20 CT head  - Normal head CT for age.    ASSESSMENT AND PLAN  24 y.o. year old female here with:   Dx:  1. Neck pain   2. Bilateral numbness and tingling of arms and legs       PLAN:  INTERMITTENT NECK PAIN / NUMBNESS / DYSPNEA (post covid) - check MRI cervical spine; rule out demyelinating dz  Orders Placed This Encounter  Procedures  . MR CERVICAL SPINE W WO CONTRAST   No follow-ups on file.    Suanne Marker, MD 11/09/2020, 11:40 AM Certified in Neurology, Neurophysiology and Neuroimaging  [facility] [address], [address] [place], [place] [address] [phone]

## 2020-11-14 ENCOUNTER — Telehealth: Payer: Self-pay | Admitting: Diagnostic Neuroimaging

## 2020-11-14 NOTE — Telephone Encounter (Signed)
[facility] [place]: [id number] (exp. 12/29/20) order sent to [facility]  [facility] auth: [id number] (exp. 11/10/20 to 12/10/20). They will reach out to the patient to schedule.

## 2020-11-23 ENCOUNTER — Ambulatory Visit (INDEPENDENT_AMBULATORY_CARE_PROVIDER_SITE_OTHER): Payer: Commercial Managed Care - PPO | Admitting: Family Medicine

## 2020-11-23 ENCOUNTER — Encounter: Payer: Self-pay | Admitting: Family Medicine

## 2020-11-23 ENCOUNTER — Emergency Department (HOSPITAL_COMMUNITY)
Admission: EM | Admit: 2020-11-23 | Discharge: 2020-11-23 | Disposition: A | Discharge status: Home / Self Care | Payer: Commercial Managed Care - PPO | Attending: Emergency Medicine | Admitting: Emergency Medicine

## 2020-11-23 ENCOUNTER — Encounter (HOSPITAL_COMMUNITY): Payer: Self-pay | Admitting: *Deleted

## 2020-11-23 ENCOUNTER — Other Ambulatory Visit: Payer: Self-pay

## 2020-11-23 VITALS — BP 130/84 | Ht 63.0 in | Wt 135.0 lb

## 2020-11-23 DIAGNOSIS — M659 Synovitis and tenosynovitis, unspecified: Secondary | ICD-10-CM

## 2020-11-23 DIAGNOSIS — I10 Essential (primary) hypertension: Secondary | ICD-10-CM | POA: Diagnosis not present

## 2020-11-23 DIAGNOSIS — N611 Abscess of the breast and nipple: Secondary | ICD-10-CM | POA: Insufficient documentation

## 2020-11-23 DIAGNOSIS — N644 Mastodynia: Secondary | ICD-10-CM | POA: Diagnosis present

## 2020-11-23 DIAGNOSIS — J4531 Mild persistent asthma with (acute) exacerbation: Secondary | ICD-10-CM | POA: Diagnosis not present

## 2020-11-23 MED ORDER — DOXYCYCLINE HYCLATE 100 MG PO TABS
100.0000 mg | ORAL_TABLET | Freq: Once | ORAL | Status: AC
  Administered 2020-11-23: 100 mg via ORAL
  Filled 2020-11-23: qty 1

## 2020-11-23 MED ORDER — DOXYCYCLINE HYCLATE 100 MG PO CAPS
100.0000 mg | ORAL_CAPSULE | Freq: Two times a day (BID) | ORAL | 0 refills | Status: AC
Start: 2020-11-23 — End: 2020-11-30

## 2020-11-23 NOTE — ED Triage Notes (Signed)
Pt reports right breast pain x 2 days.  The pain got worse yesterday.  Pt reports increased tenderness, swelling and some redness around the nipple.  Pt states she just finished her period and took out the nipple piecing over three months ago.  Pt denies any drainage.

## 2020-11-23 NOTE — Discharge Instructions (Signed)
Take the antibiotic, doxycycline, every 12 hours until gone.  Apply frequent warm compresses or warm water soaks to your breast to help encourage drainage. Take 600 mg of ibuprofen every 6 hours for pain and swelling. Call the breast imaging center as soon as possible to schedule an appointment for further management Follow closely with your primary care provider to discuss your diagnosis and follow up on your visit at the breast center. Return to the emergency department if you develop fevers/chills, worsening redness to your breast, or other concerning symptoms.

## 2020-11-23 NOTE — Progress Notes (Signed)
Stacey Reid - 24 y.o. female MRN [id number]  Date of birth: 01-Oct-1996  SUBJECTIVE:  Including CC & ROS.  Chief Complaint  Patient presents with  . Follow-up    right knee    Stacey Reid is a 24 y.o. female that is following up for bilateral knee pain.  She has gotten improvement with the prednisone.  Currently she only intermittently has pain.  She feels it was walking or getting up from a seated position.   Review of Systems See HPI   HISTORY: Past Medical, Surgical, Social, and Family History Reviewed & Updated per EMR.   Pertinent Historical Findings include:  Past Medical History:  Diagnosis Date  . Anemia affecting pregnancy 03/21/2017  . Asthma    years ago, exercise induced  . Headache    with pregnancy  . Heart murmur    history of, resolved 3 months later  . Hyperemesis 2016  . Hypertension   . Infection    UTI  . Pregnancy induced hypertension   . Syncope    in high school, found out had a heart murmur    Past Surgical History:  Procedure Laterality Date  . LAPAROSCOPIC TUBAL LIGATION Bilateral 07/02/2017   Procedure: LAPAROSCOPIC TUBAL LIGATION - Filshie clips;  Surgeon: Hermina Staggers, MD;  Location: [facility];  Service: Gynecology;  Laterality: Bilateral;  . WISDOM TOOTH EXTRACTION      Family History  Problem Relation Age of Onset  . Asthma Mother   . Hypertension Mother   . Miscarriages / [place] Mother   . Depression Mother   . Lupus Mother   . Healthy Father     Social History   Socioeconomic History  . Marital status: Divorced    Spouse name: Not on file  . Number of children: 2  . Years of education: Not on file  . Highest education level: Some college, no degree  Occupational History    Comment: [employer]  Tobacco Use  . Smoking status: Never Smoker  . Smokeless tobacco: Never Used  Vaping Use  . Vaping Use: Never used  Substance and Sexual Activity  . Alcohol use: Yes    Comment: occ.  .  Drug use: Not Currently    Types: Marijuana    Comment: quit 12/2019  . Sexual activity: Not on file  Other Topics Concern  . Not on file  Social History Narrative   Lives with 2 children   Caffeine- sodas, 3 daily   Social Determinants of Health   Financial Resource Strain:   . Difficulty of Paying Living Expenses: Not on file  Food Insecurity:   . Worried About Programme researcher, broadcasting/film/video in the Last Year: Not on file  . Ran Out of Food in the Last Year: Not on file  Transportation Needs:   . Lack of Transportation (Medical): Not on file  . Lack of Transportation (Non-Medical): Not on file  Physical Activity:   . Days of Exercise per Week: Not on file  . Minutes of Exercise per Session: Not on file  Stress:   . Feeling of Stress : Not on file  Social Connections:   . Frequency of Communication with Friends and Family: Not on file  . Frequency of Social Gatherings with Friends and Family: Not on file  . Attends Religious Services: Not on file  . Active Member of Clubs or Organizations: Not on file  . Attends Banker Meetings: Not on file  . Marital Status:  Not on file  Intimate Partner Violence:   . Fear of Current or Ex-Partner: Not on file  . Emotionally Abused: Not on file  . Physically Abused: Not on file  . Sexually Abused: Not on file     PHYSICAL EXAM:  VS: BP 130/84   Ht 5\' 3"  (1.6 m)   Wt 135 lb (61.2 kg)   LMP 11/07/2020   BMI 23.91 kg/m  Physical Exam Gen: NAD, alert, cooperative with exam, well-appearing MSK:  Right and left knee: No obvious effusion. Range of motion. Normal strength resistance. Neurovascularly intact     ASSESSMENT & PLAN:   Synovitis of knee Has had a good response with the prednisone.  Still gets pain intermittently.  Possible to have a component of patellofemoral syndrome in nature. -Counseled on home exercise therapy and supportive care. -CRP, sed rate, ANA. -Could consider physical therapy.

## 2020-11-23 NOTE — Assessment & Plan Note (Signed)
Has had a good response with the prednisone.  Still gets pain intermittently.  Possible to have a component of patellofemoral syndrome in nature. -Counseled on home exercise therapy and supportive care. -CRP, sed rate, ANA. -Could consider physical therapy.

## 2020-11-23 NOTE — ED Provider Notes (Signed)
[facility]-EMERGENCY DEPT Provider Note   CSN: [id number] Arrival date & time: 11/23/20  4174     History Chief Complaint  Patient presents with  . Breast Pain    right    Stacey Reid is a 24 y.o. female presenting to the emergency department with complaint of right breast pain.  Patient states symptoms began 2 days ago with aching pain around her nipple.  She is having pain with any palpation though is having constant aching pain.  She states she had a piercing which was removed 3 months ago by preference. States   She is having no generalized swelling or redness to her breast, no fevers or chills, no discharge or draining wounds. Patient is not lactating. Also states she is "LGBTQ" and there is no chance of pregnancy.   The history is provided by the patient.       Past Medical History:  Diagnosis Date  . Anemia affecting pregnancy 03/21/2017  . Asthma    years ago, exercise induced  . Headache    with pregnancy  . Heart murmur    history of, resolved 3 months later  . Hyperemesis 2016  . Hypertension   . Infection    UTI  . Pregnancy induced hypertension   . Syncope    in high school, found out had a heart murmur    Patient Active Problem List   Diagnosis Date Noted  . Synovitis of knee 10/27/2020  . Post-operative state 07/16/2017  . Gestational hypertension 06/25/2017  . Sickle cell trait (HCC) 11/13/2016  . Low vitamin D level 11/13/2016  . Asthma exacerbation, mild 11/05/2016    Past Surgical History:  Procedure Laterality Date  . LAPAROSCOPIC TUBAL LIGATION Bilateral 07/02/2017   Procedure: LAPAROSCOPIC TUBAL LIGATION - Filshie clips;  Surgeon: Hermina Staggers, MD;  Location: [facility];  Service: Gynecology;  Laterality: Bilateral;  . WISDOM TOOTH EXTRACTION       OB History    Gravida  3   Para  2   Term  2   Preterm  0   AB  1   Living  2     SAB  1   TAB  0   Ectopic  0   Multiple  0   Live Births    2           Family History  Problem Relation Age of Onset  . Asthma Mother   . Hypertension Mother   . Miscarriages / [place] Mother   . Depression Mother   . Lupus Mother   . Healthy Father     Social History   Tobacco Use  . Smoking status: Never Smoker  . Smokeless tobacco: Never Used  Vaping Use  . Vaping Use: Never used  Substance Use Topics  . Alcohol use: Yes    Comment: occ.  . Drug use: Not Currently    Types: Marijuana    Comment: quit 12/2019    Home Medications Prior to Admission medications   Medication Sig Start Date End Date Taking? Authorizing Provider  albuterol (VENTOLIN HFA) 108 (90 Base) MCG/ACT inhaler Inhale 1-2 puffs into the lungs every 6 (six) hours as needed for shortness of breath. 09/15/19   [provider]  diclofenac Sodium (VOLTAREN) 1 % GEL Apply 2 g topically 4 (four) times daily. 06/18/20   Caccavale, Sophia, PA-C  doxycycline (VIBRAMYCIN) 100 MG capsule Take 1 capsule (100 mg total) by mouth 2 (two) times daily  for 7 days. 11/23/20 11/30/20  Bentlie Withem, [place] N, PA-C  famotidine (PEPCID) 20 MG tablet Take 1 tablet (20 mg total) by mouth daily. Patient not taking: Reported on 11/09/2020 06/18/20   Caccavale, Sophia, PA-C  FLUoxetine (PROZAC) 10 MG capsule Take 10 mg by mouth 2 (two) times daily. 02/04/20   [provider]  hydrOXYzine (ATARAX/VISTARIL) 25 MG tablet Take 25 mg by mouth 4 (four) times daily as needed for anxiety. Patient not taking: Reported on 11/09/2020 02/04/20   [provider]  ibuprofen (ADVIL) 100 MG/5ML suspension Take 600 mg by mouth every 4 (four) hours as needed for mild pain. Patient not taking: Reported on 11/09/2020    [provider]  Multiple Vitamin (MULTIVITAMIN WITH MINERALS) TABS tablet Take 1 tablet by mouth daily. Patient not taking: Reported on 11/09/2020    [provider]  naproxen (NAPROSYN) 375 MG tablet Take 1 tablet twice daily as needed for  pain. Patient not taking: Reported on 11/09/2020 04/28/20   Molpus, Jonny Ruiz, MD  metoCLOPramide (REGLAN) 10 MG tablet Take 1 tablet (10 mg total) by mouth every 6 (six) hours. Patient not taking: Reported on 03/16/2020 02/08/20 04/28/20  Anselm Pancoast, PA-C    Allergies    Patient has no known allergies.  Review of Systems   Review of Systems  Constitutional: Negative for chills and fever.  Skin:       Breast pain    Physical Exam Updated Vital Signs BP 120/72 (BP Location: Right Arm)   Pulse 79   Temp 98 F (36.7 C) (Oral)   Resp 15   Ht 5\' 2"  (1.575 m)   Wt 61.2 kg   LMP 11/07/2020   SpO2 100%   BMI 24.69 kg/m   Physical Exam Vitals and nursing note reviewed.  Constitutional:      General: She is not in acute distress.    Appearance: She is well-developed. She is not ill-appearing.  HENT:     Head: Normocephalic and atraumatic.  Eyes:     Conjunctiva/sclera: Conjunctivae normal.  Cardiovascular:     Rate and Rhythm: Normal rate and regular rhythm.  Pulmonary:     Effort: Pulmonary effort is normal.     Breath sounds: Normal breath sounds.  Chest:     Comments: Exam performed with female RN chaperone present. Right nipple with localized erythema, mild swelling, TTP. Pustules are present at nipple piercing holes to bilateral surfaces of the nipple - lateral aspect appears slightly more inflamed. Mild induration noted to the areola at the base of the nipple though erythema, induration or swelling to breast. No palpable masses within the breast. Small palpable lymph node in right axilla that is nontender. Neurological:     Mental Status: She is alert.  Psychiatric:        Mood and Affect: Mood normal.        Behavior: Behavior normal.     ED Results / Procedures / Treatments   Labs (all labs ordered are listed, but only abnormal results are displayed) Labs Reviewed - No data to display  EKG None  Radiology No results found.  Procedures Procedures (including  critical care time)  Medications Ordered in ED Medications  doxycycline (VIBRA-TABS) tablet 100 mg (100 mg Oral Given 11/23/20 1001)    ED Course  I have reviewed the triage vital signs and the nursing notes.  Pertinent labs & imaging results that were available during my care of the patient were reviewed by me and considered  in my medical decision making (see chart for details).    MDM Rules/Calculators/A&P                          Patient with presentation consistent with developing abscess to the right nipple.  She has induration and redness to the right nipple with pustule present at the location of her old nipple piercing.  There is no surrounding erythema, edema or tenderness to the breast.  Overall she is well-appearing, afebrile without systemic symptoms.  She is not lactating and reports no chance of pregnancy due to sexual preferences.  She does have a PCP however was unable to obtain an appointment.  She is treated with doxycycline, recommend frequent warm compresses, NSAIDs for pain.  She is provided with referral to the breast imaging center and recommended to call ASAP for appointment, patient may need image guided drainage.  Return precautions discussed, patient is appropriate for discharge at this time.  Discussed results, findings, treatment and follow up. Patient advised of return precautions. Patient verbalized understanding and agreed with plan.  Final Clinical Impression(s) / ED Diagnoses Final diagnoses:  Abscess of right nipple    Rx / DC Orders ED Discharge Orders         Ordered    doxycycline (VIBRAMYCIN) 100 MG capsule  2 times daily        11/23/20 0953           Mikeyla Music, [place] N, PA-C 11/23/20 1038    Lorre Nick, MD 11/28/20 1337

## 2020-11-23 NOTE — Patient Instructions (Signed)
Good to see you Please try the exercises  Please try ice as needed  I will call with the results from today  Please use ibuprofen as needed   Please send me a message in MyChart with any questions or updates.  Please see me back in 4 weeks.   --Dr. Jordan Likes

## 2020-11-25 ENCOUNTER — Other Ambulatory Visit: Payer: Self-pay

## 2020-11-25 ENCOUNTER — Ambulatory Visit
Admission: RE | Admit: 2020-11-25 | Discharge: 2020-11-25 | Disposition: A | Discharge status: Home / Self Care | Payer: Commercial Managed Care - PPO | Source: Ambulatory Visit | Attending: Diagnostic Neuroimaging | Admitting: Diagnostic Neuroimaging

## 2020-11-25 ENCOUNTER — Telehealth: Payer: Self-pay | Admitting: Family Medicine

## 2020-11-25 DIAGNOSIS — R200 Anesthesia of skin: Secondary | ICD-10-CM

## 2020-11-25 DIAGNOSIS — M542 Cervicalgia: Secondary | ICD-10-CM

## 2020-11-25 LAB — C-REACTIVE PROTEIN: CRP: <1 mg/L (ref 0–10)

## 2020-11-25 LAB — ANA,IFA RA DIAG PNL W/RFLX TIT/PATN
ANA Titer 1: NEGATIVE
Cyclic Citrullin Peptide Ab: 12 units (ref 0–19)
Rheumatoid fact SerPl-aCnc: <10 IU/mL (ref <14.0)

## 2020-11-25 LAB — SEDIMENTATION RATE: Sed Rate: 2 mm/hr (ref 0–32)

## 2020-11-25 MED ORDER — GADOBENATE DIMEGLUMINE 529 MG/ML IV SOLN
12.0000 mL | Freq: Once | INTRAVENOUS | Status: AC | PRN
  Administered 2020-11-25: 12 mL via INTRAVENOUS

## 2020-11-25 NOTE — Telephone Encounter (Signed)
Informed of results.   Myra Rude, MD [facility] 11/25/2020, 8:08 AM

## 2020-12-20 ENCOUNTER — Encounter: Payer: Self-pay | Admitting: *Deleted

## 2020-12-21 ENCOUNTER — Ambulatory Visit (INDEPENDENT_AMBULATORY_CARE_PROVIDER_SITE_OTHER): Payer: Commercial Managed Care - PPO | Admitting: Family Medicine

## 2020-12-21 ENCOUNTER — Other Ambulatory Visit: Payer: Self-pay

## 2020-12-21 DIAGNOSIS — M222X1 Patellofemoral disorders, right knee: Secondary | ICD-10-CM | POA: Diagnosis not present

## 2020-12-21 DIAGNOSIS — M222X2 Patellofemoral disorders, left knee: Secondary | ICD-10-CM | POA: Diagnosis not present

## 2020-12-21 NOTE — Progress Notes (Signed)
  Stacey Reid - 24 y.o. female MRN [id number]  Date of birth: 23-Jul-1996  SUBJECTIVE:  Including CC & ROS.  No chief complaint on file.   Stacey Reid is a 24 y.o. female that is following up for her bilateral knee pain. The pain is intermittent in nature. She does experience pain when she gets up from seated position. She has been having anterior knee pain that is localized.   Review of Systems See HPI   HISTORY: Past Medical, Surgical, Social, and Family History Reviewed & Updated per EMR.   Pertinent Historical Findings include:  Past Medical History:  Diagnosis Date  . Anemia affecting pregnancy 03/21/2017  . Asthma    years ago, exercise induced  . Headache    with pregnancy  . Heart murmur    history of, resolved 3 months later  . Hyperemesis 2016  . Hypertension   . Infection    UTI  . Pregnancy induced hypertension   . Syncope    in high school, found out had a heart murmur    Past Surgical History:  Procedure Laterality Date  . LAPAROSCOPIC TUBAL LIGATION Bilateral 07/02/2017   Procedure: LAPAROSCOPIC TUBAL LIGATION - Filshie clips;  Surgeon: Hermina Staggers, MD;  Location: [facility];  Service: Gynecology;  Laterality: Bilateral;  . WISDOM TOOTH EXTRACTION      Family History  Problem Relation Age of Onset  . Asthma Mother   . Hypertension Mother   . Miscarriages / [place] Mother   . Depression Mother   . Lupus Mother   . Healthy Father     Social History   Socioeconomic History  . Marital status: Divorced    Spouse name: Not on file  . Number of children: 2  . Years of education: Not on file  . Highest education level: Some college, no degree  Occupational History    Comment: [employer]  Tobacco Use  . Smoking status: Never Smoker  . Smokeless tobacco: Never Used  Vaping Use  . Vaping Use: Never used  Substance and Sexual Activity  . Alcohol use: Yes    Comment: occ.  . Drug use: Not Currently    Types:  Marijuana    Comment: quit 12/2019  . Sexual activity: Not on file  Other Topics Concern  . Not on file  Social History Narrative   Lives with 2 children   Caffeine- sodas, 3 daily   Social Determinants of Health   Financial Resource Strain: Not on file  Food Insecurity: Not on file  Transportation Needs: Not on file  Physical Activity: Not on file  Stress: Not on file  Social Connections: Not on file  Intimate Partner Violence: Not on file     PHYSICAL EXAM:  VS: BP 103/71   Ht 5\' 3"  (1.6 m)   Wt 135 lb (61.2 kg)   BMI 23.91 kg/m  Physical Exam Gen: NAD, alert, cooperative with exam, well-appearing   ASSESSMENT & PLAN:   Patellofemoral pain syndrome of both knees Having pain but seems more patellofemoral in nature at this point. -Counseled on home exercise therapy and supportive care. -Referral to physical therapy. -Could consider imaging.

## 2020-12-21 NOTE — Patient Instructions (Signed)
Good to see you  Physical therapy will give you a call  Please send me a message in MyChart with any questions or updates.  Please see me back in 6 weeks.   --Dr. Jordan Likes

## 2020-12-21 NOTE — Assessment & Plan Note (Addendum)
Having pain but seems more patellofemoral in nature at this point. -Counseled on home exercise therapy and supportive care. -Referral to physical therapy. -Could consider imaging.

## 2020-12-27 ENCOUNTER — Ambulatory Visit: Payer: Medicaid Other | Admitting: Physical Therapy

## 2021-01-30 ENCOUNTER — Other Ambulatory Visit: Payer: Self-pay

## 2021-01-30 ENCOUNTER — Telehealth (INDEPENDENT_AMBULATORY_CARE_PROVIDER_SITE_OTHER): Payer: Medicaid Other | Admitting: Family Medicine

## 2021-01-30 DIAGNOSIS — M222X2 Patellofemoral disorders, left knee: Secondary | ICD-10-CM | POA: Diagnosis not present

## 2021-01-30 DIAGNOSIS — M222X1 Patellofemoral disorders, right knee: Secondary | ICD-10-CM | POA: Diagnosis not present

## 2021-01-30 NOTE — Assessment & Plan Note (Signed)
Pain is ongoing. Seems to be staying the same.  - counseled on home exercise therapy and supportive care - continue to setup PT  - would pursue MRI if pain is ongoing.

## 2021-01-30 NOTE — Progress Notes (Signed)
Virtual Visit via Video Note  I connected with [name] on 01/30/21 at  1:30 PM EST by a video enabled telemedicine application and verified that I am speaking with the correct person using two identifiers.  Location: Patient: home Provider: home   I discussed the limitations of evaluation and management by telemedicine and the availability of in person appointments. The patient expressed understanding and agreed to proceed.  History of Present Illness:  Ms. Kallenberger is a 25 yo F that is following up for her bilateral knee pain. She is getting over a recent Covid infection. She hasn't been able to start physical therapy yet. She called them today to schedule. The knees still hurt.    Observations/Objective:  Gen: NAD, alert, cooperative with exam, well-appearing   Assessment and Plan:  PF syndrome of b/l knee:  Pain is ongoing. Seems to be staying the same.  - counseled on home exercise therapy and supportive care - continue to setup PT  - would pursue MRI if pain is ongoing.   Follow Up Instructions:    I discussed the assessment and treatment plan with the patient. The patient was provided an opportunity to ask questions and all were answered. The patient agreed with the plan and demonstrated an understanding of the instructions.   The patient was advised to call back or seek an in-person evaluation if the symptoms worsen or if the condition fails to improve as anticipated.   Clare Gandy, MD

## 2021-02-08 ENCOUNTER — Encounter: Payer: Self-pay | Admitting: Physical Therapy

## 2021-02-08 ENCOUNTER — Ambulatory Visit: Payer: Medicaid Other | Attending: Family Medicine | Admitting: Physical Therapy

## 2021-02-08 ENCOUNTER — Other Ambulatory Visit: Payer: Self-pay

## 2021-02-08 DIAGNOSIS — R252 Cramp and spasm: Secondary | ICD-10-CM | POA: Diagnosis present

## 2021-02-08 DIAGNOSIS — M6281 Muscle weakness (generalized): Secondary | ICD-10-CM | POA: Insufficient documentation

## 2021-02-08 DIAGNOSIS — G8929 Other chronic pain: Secondary | ICD-10-CM | POA: Insufficient documentation

## 2021-02-08 DIAGNOSIS — M25561 Pain in right knee: Secondary | ICD-10-CM | POA: Insufficient documentation

## 2021-02-08 DIAGNOSIS — M25562 Pain in left knee: Secondary | ICD-10-CM | POA: Diagnosis present

## 2021-02-08 NOTE — Patient Instructions (Signed)
Access Code: [email or web address] URL: [email or web address] Date: 02/08/2021 Prepared by: Lysle Rubens  Exercises Gastroc Stretch on Wall - 1 x daily - 7 x weekly - 3 sets - 2 reps - 15-20 sec hold Soleus Stretch on Wall - 1 x daily - 7 x weekly - 3 sets - 2 reps - 15-20 sec hold Wall Quarter Squat - 1 x daily - 7 x weekly - 3 sets - 5 reps - 5-10 sec hold Sit to Stand Without Arm Support - 1 x daily - 7 x weekly - 3 sets - 10 reps Hip Abduction with Resistance Loop - 1 x daily - 7 x weekly - 3 sets - 10 reps Hip Extension with Resistance Loop - 1 x daily - 7 x weekly - 3 sets - 10 reps

## 2021-02-08 NOTE — Therapy (Signed)
[facility] [address] [place], [place], [address] Phone: [phone]   Fax:  [phone]  Physical Therapy Evaluation  Patient Details  Name: Stacey Reid MRN: [id number] Date of Birth: 13-Dec-1996 Referring Provider (PT): Lenn Cal Date: 02/08/2021   PT End of Session - 02/08/21 1118    Visit Number 1    Date for PT Re-Evaluation 04/08/21    Authorization Type has to make appts for following week on thursday d/t work schedule    PT Start Time 1015    PT Stop Time 1050    PT Time Calculation (min) 35 min    Activity Tolerance Patient tolerated treatment well    Behavior During Therapy [facility] for tasks assessed/performed           Past Medical History:  Diagnosis Date  . Anemia affecting pregnancy 03/21/2017  . Asthma    years ago, exercise induced  . Headache    with pregnancy  . Heart murmur    history of, resolved 3 months later  . Hyperemesis 2016  . Hypertension   . Infection    UTI  . Pregnancy induced hypertension   . Syncope    in high school, found out had a heart murmur    Past Surgical History:  Procedure Laterality Date  . LAPAROSCOPIC TUBAL LIGATION Bilateral 07/02/2017   Procedure: LAPAROSCOPIC TUBAL LIGATION - Filshie clips;  Surgeon: Hermina Staggers, MD;  Location: [facility];  Service: Gynecology;  Laterality: Bilateral;  . WISDOM TOOTH EXTRACTION      There were no vitals filed for this visit.    Subjective Assessment - 02/08/21 1015    Subjective Pt reports B knee pain present for the past ~6 months with no known MOI. Pt reports that she has knee pain across the front and back of knees like a throbbing band radiating down to lateral legs. Pt reports occasional pain with stairs, difficulty identifying aggravating factors. Pt reports occasional numbness across the top of feet. Pt reports occasional instability in B knees with knees giving out. Pt reports that she had an instance  with kneecap "popping up", presented to ER and when she got there it went back down.    Diagnostic tests xray and [place]    Patient Stated Goals reduce knee pain    Currently in Pain? Yes    Pain Score 4     Pain Location Knee    Pain Orientation Right;Left    Pain Descriptors / Indicators Dull    Pain Type Chronic pain    Pain Radiating Towards BLE    Pain Onset More than a month ago    Pain Frequency Intermittent    Aggravating Factors  difficulty identifying aggravating factors    Pain Relieving Factors medicine, rest              [facility] PT Assessment - 02/08/21 0001      Assessment   Medical Diagnosis B knee pain    Referring Provider (PT) Jordan Likes    Prior Therapy none      Precautions   Precautions None      Restrictions   Weight Bearing Restrictions No      Balance Screen   Has the patient fallen in the past 6 months No    Has the patient had a decrease in activity level because of a fear of falling?  No    Is the patient reluctant to leave their home  because of a fear of falling?  No      Home Environment   Additional Comments no stairs at home; reports no difficulty with stairs typically      Prior Function   Level of Independence Independent    Vocation Full time employment    Vocation Requirements [employer]; up on feet for up to 9 hours a day    Leisure used to workout, hasn't had time      Sensation   Light Touch Appears Intact      Functional Tests   Functional tests Sit to Stand;Squat;Single Leg Squat;Single leg stance      Squat   Comments mild B hip adduction      Single Leg Squat   Comments excess knee valgus with squatting B; hip weakness B      Single Leg Stance   Comments WFL B      Sit to Stand   Comments WFL B, pt reporting aching in knees post      ROM / Strength   AROM / PROM / Strength AROM;Strength      AROM   Overall AROM  Within functional limits for tasks performed    Overall AROM Comments WFL knee and hip AROM       Strength   Overall Strength Comments 5/5 BLE MMT except hip abd/ext 4/5; reports mild pain with knee flexion/extension MMT      Flexibility   Soft Tissue Assessment /Muscle Length yes    Hamstrings WFL    Quadriceps WFL    ITB mild tightness/pain with Ober's    Piriformis WFL      Palpation   Patella mobility tight B, some crepitus noted B, R patella with palpable swelling around perimeter    Palpation comment tender to palpation B patellar tendon and with palpation of B patella      Special Tests    Special Tests Knee Special Tests    Knee Special tests  Patellofemoral Apprehension Test      Patellofemoral Apprehension Test    Findings Negative    Side  Right;Left      Transfers   Five time sit to stand comments  WFL                      Objective measurements completed on examination: See above findings.       [facility] Adult PT Treatment/Exercise - 02/08/21 0001      Exercises   Exercises Knee/Hip      Knee/Hip Exercises: Stretches   Gastroc Stretch Both;1 rep;20 seconds    Soleus Stretch Both;1 rep;20 seconds      Knee/Hip Exercises: Standing   Hip Abduction Both;1 set;10 reps    Abduction Limitations red tb    Hip Extension Both;1 set;10 reps    Extension Limitations red tb    Other Standing Knee Exercises wall quarter squat 5x 5 sec hold    Other Standing Knee Exercises STS with slow, eccentric control                  PT Education - 02/08/21 1118    Education Details Pt educated on POC and HEP    Person(s) Educated Patient    Methods Explanation;Demonstration;Handout    Comprehension Verbalized understanding;Returned demonstration            PT Short Term Goals - 02/08/21 1122      PT SHORT TERM GOAL #1   Title Pt will  be I with initial HEP    Time 2    Period Weeks    Status New    Target Date 02/22/21             PT Long Term Goals - 02/08/21 1122      PT LONG TERM GOAL #1   Title Pt will be I with advanced HEP     Time 6    Period Weeks    Status New    Target Date 03/22/21      PT LONG TERM GOAL #2   Title Pt will report 50% reduction in B knee pain    Time 6    Period Weeks    Status New    Target Date 03/22/21      PT LONG TERM GOAL #3   Title Pt will report able to work entire shift at [employer] with </= 1/10 B knee pain    Time 6    Period Weeks    Status New    Target Date 03/22/21      PT LONG TERM GOAL #4   Title Pt will demo improved B hip ext/abd MMT strength to 5/5    Baseline 4/5    Time 6    Period Weeks    Status New    Target Date 03/22/21                  Plan - 02/08/21 1126    Clinical Impression Statement Pt presents to clinic with reports of B knee pain present for ~6 mos, no known MOI. Pt has had [place] which showed some evidence of synovitis B. Xrays were negative for any acute bony abnormalities. Pt does have mild pain with palpation to B knee along with creptius with lat/med movement of B patella R>L. Demos mild hypomobility B patella R>L. With single leg squat and squat to chair pt demos hip adduction with excessive knee valgus. Signs/symptoms consistent with patellofemoral knee syndrome. Pt also demos hip abd/ext weakness with MMT. Pt would benefit from hip strengthening, single leg stability ex's, quad strengthening, and gentle progression to dynamic ex's in order to return to PLOF. Asked pt to wear pants that she can roll up so we can more closely examine patellar movement next rx.    Examination-Participation Restrictions Community Activity;Interpersonal Relationship;Occupation    Stability/Clinical Decision Making Stable/Uncomplicated    Clinical Decision Making Low    Rehab Potential Good    PT Frequency 2x / week    PT Duration 6 weeks    PT Treatment/Interventions ADLs/Self Care Home Management;Electrical Stimulation;Iontophoresis 4mg /ml Dexamethasone;Ultrasound;Neuromuscular re-education;Balance training;Therapeutic exercise;Therapeutic  activities;Stair training;Patient/family education;Manual techniques;Passive range of motion;Taping;Vasopneumatic Device    PT Next Visit Plan asked pt to wear pants that can rollup for further assessment of patellar movement/tracking, initiate quad/LE strengthening and stab, patellar mobs/taping/manual/modalities as indicated    PT Home Exercise Plan see pt instructions    Consulted and Agree with Plan of Care Patient           Patient will benefit from skilled therapeutic intervention in order to improve the following deficits and impairments:  Abnormal gait,Difficulty walking,Increased muscle spasms,Decreased activity tolerance,Pain,Hypomobility,Decreased strength,Postural dysfunction  Visit Diagnosis: Chronic pain of left knee  Chronic pain of right knee  Cramp and spasm  Muscle weakness (generalized)     Problem List Patient Active Problem List   Diagnosis Date Noted  . Patellofemoral pain syndrome of both knees 10/27/2020  . Post-operative state 07/16/2017  .  Gestational hypertension 06/25/2017  . Sickle cell trait (HCC) 11/13/2016  . Low vitamin D level 11/13/2016  . Asthma exacerbation, mild 11/05/2016   Lysle Rubens, PT, DPT Stacey Reid 02/08/2021, 11:46 AM  [facility] [address] [place], [place], [address] Phone: [phone]   Fax:  [phone]  Name: Stacey Reid MRN: [id number] Date of Birth: Dec 17, 1996

## 2021-02-14 ENCOUNTER — Other Ambulatory Visit: Payer: Self-pay

## 2021-02-14 ENCOUNTER — Ambulatory Visit: Payer: Medicaid Other | Admitting: Physical Therapy

## 2021-02-14 DIAGNOSIS — M6281 Muscle weakness (generalized): Secondary | ICD-10-CM

## 2021-02-14 DIAGNOSIS — M25562 Pain in left knee: Secondary | ICD-10-CM | POA: Diagnosis not present

## 2021-02-14 DIAGNOSIS — G8929 Other chronic pain: Secondary | ICD-10-CM

## 2021-02-14 NOTE — Therapy (Signed)
[place]. [place], [place], [address] Phone: [phone]   Fax:  [phone]  Physical Therapy Treatment  Patient Details  Name: Stacey Reid MRN: [id number] Date of Birth: 06/18/1996 Referring Provider (PT): Clinton Quant Date: 02/14/2021   PT End of Session - 02/14/21 1645    Visit Number 2    Date for PT Re-Evaluation 04/08/21    PT Start Time 7681    PT Stop Time 1572    PT Time Calculation (min) 30 min           Past Medical History:  Diagnosis Date  . Anemia affecting pregnancy 03/21/2017  . Asthma    years ago, exercise induced  . Headache    with pregnancy  . Heart murmur    history of, resolved 3 months later  . Hyperemesis 2016  . Hypertension   . Infection    UTI  . Pregnancy induced hypertension   . Syncope    in high school, found out had a heart murmur    Past Surgical History:  Procedure Laterality Date  . LAPAROSCOPIC TUBAL LIGATION Bilateral 07/02/2017   Procedure: LAPAROSCOPIC TUBAL LIGATION - Filshie clips;  Surgeon: Chancy Milroy, MD;  Location: [facility];  Service: Gynecology;  Laterality: Bilateral;  . WISDOM TOOTH EXTRACTION      There were no vitals filed for this visit.   Subjective Assessment - 02/14/21 1616    Subjective ex are going well but after a few ex its like I worked out for an hour.    Currently in Pain? No/denies                             [facility] Adult PT Treatment/Exercise - 02/14/21 0001      Knee/Hip Exercises: Aerobic   Nustep L 5 5 min - LE only      Knee/Hip Exercises: Machines for Strengthening   Cybex Knee Extension 15# 2 sets 10 with ball squeze    Cybex Leg Press 40# 3 sets 10 feet 3 way      Knee/Hip Exercises: Standing   Hip Flexion Stengthening;Both;15 reps;Knee straight   1 set toes fwd, 1 set ER red tband   Lateral Step Up Both;10 reps;Hand Hold: 0;Step Height: 6"   opp leg abd   Forward Step Up  Both;10 reps;Hand Hold: 0;Step Height: 8";Step Height: 6"   opp leg ext   Step Down Both;10 reps;Step Height: 6";Hand Hold: 1    Walking with Sports Cord 40# 5x fwd/back and 3 x each side      Knee/Hip Exercises: Seated   Long Arc Quad Strengthening;Both;15 reps   red tband                 PT Education - 02/14/21 1627    Education Details LAQ with ball squeeze and red tband, SLR toes fwd and ER red tband    Person(s) Educated Patient    Methods Explanation;Demonstration;Handout    Comprehension Verbalized understanding;Returned demonstration            PT Short Term Goals - 02/14/21 1651      PT SHORT TERM GOAL #1   Title Pt will be I with initial HEP    Status Achieved             PT Long Term Goals - 02/08/21 1122      PT LONG TERM  GOAL #1   Title Pt will be I with advanced HEP    Time 6    Period Weeks    Status New    Target Date 03/22/21      PT LONG TERM GOAL #2   Title Pt will report 50% reduction in B knee pain    Time 6    Period Weeks    Status New    Target Date 03/22/21      PT LONG TERM GOAL #3   Title Pt will report able to work entire shift at [employer] with </= 1/10 B knee pain    Time 6    Period Weeks    Status New    Target Date 03/22/21      PT LONG TERM GOAL #4   Title Pt will demo improved B hip ext/abd MMT strength to 5/5    Baseline 4/5    Time 6    Period Weeks    Status New    Target Date 03/22/21                 Plan - 02/14/21 1646    Clinical Impression Statement pt had no pain throughout session but did c/o hip fatigue and weakness noted. BIL lateral tracking of patella RT > Left. HEP issued to help tracking and strengthen hips. STG met    PT Treatment/Interventions ADLs/Self Care Home Management;Electrical Stimulation;Iontophoresis 31m/ml Dexamethasone;Ultrasound;Neuromuscular re-education;Balance training;Therapeutic exercise;Therapeutic activities;Stair training;Patient/family education;Manual  techniques;Passive range of motion;Taping;Vasopneumatic Device    PT Next Visit Plan assess and progress           Patient will benefit from skilled therapeutic intervention in order to improve the following deficits and impairments:  Abnormal gait,Difficulty walking,Increased muscle spasms,Decreased activity tolerance,Pain,Hypomobility,Decreased strength,Postural dysfunction  Visit Diagnosis: Chronic pain of left knee  Chronic pain of right knee  Muscle weakness (generalized)     Problem List Patient Active Problem List   Diagnosis Date Noted  . Patellofemoral pain syndrome of both knees 10/27/2020  . Post-operative state 07/16/2017  . Gestational hypertension 06/25/2017  . Sickle cell trait ([facility] 11/13/2016  . Low vitamin D level 11/13/2016  . Asthma exacerbation, mild 11/05/2016    Kripa Foskey,ANGIE PTA 02/14/2021, 4:52 PM  [place] [address]: [phone]  Fax:  [phone] Name: Stacey EDWARDSMRN: [id number] of Birth: [birth year]

## 2021-02-20 ENCOUNTER — Encounter: Payer: Self-pay | Admitting: Physical Therapy

## 2021-02-20 ENCOUNTER — Other Ambulatory Visit: Payer: Self-pay

## 2021-02-20 ENCOUNTER — Ambulatory Visit: Payer: Medicaid Other | Admitting: Physical Therapy

## 2021-02-20 DIAGNOSIS — G8929 Other chronic pain: Secondary | ICD-10-CM

## 2021-02-20 DIAGNOSIS — M6281 Muscle weakness (generalized): Secondary | ICD-10-CM

## 2021-02-20 DIAGNOSIS — R252 Cramp and spasm: Secondary | ICD-10-CM

## 2021-02-20 DIAGNOSIS — M25561 Pain in right knee: Secondary | ICD-10-CM

## 2021-02-20 DIAGNOSIS — M25562 Pain in left knee: Secondary | ICD-10-CM | POA: Diagnosis not present

## 2021-02-20 NOTE — Therapy (Signed)
[facility] [address]. [place], [place], [address] Phone: [phone]   Fax:  [phone]  Physical Therapy Treatment  Patient Details  Name: Stacey Reid MRN: [id number] Date of Birth: 1996-03-19 Referring Provider (PT): Lenn Cal Date: 02/20/2021   PT End of Session - 02/20/21 1704    Visit Number 3    Date for PT Re-Evaluation 04/08/21    PT Start Time 1611    PT Stop Time 1700    PT Time Calculation (min) 49 min    Activity Tolerance Patient tolerated treatment well    Behavior During Therapy [facility] for tasks assessed/performed           Past Medical History:  Diagnosis Date  . Anemia affecting pregnancy 03/21/2017  . Asthma    years ago, exercise induced  . Headache    with pregnancy  . Heart murmur    history of, resolved 3 months later  . Hyperemesis 2016  . Hypertension   . Infection    UTI  . Pregnancy induced hypertension   . Syncope    in high school, found out had a heart murmur    Past Surgical History:  Procedure Laterality Date  . LAPAROSCOPIC TUBAL LIGATION Bilateral 07/02/2017   Procedure: LAPAROSCOPIC TUBAL LIGATION - Filshie clips;  Surgeon: Hermina Staggers, MD;  Location: [facility];  Service: Gynecology;  Laterality: Bilateral;  . WISDOM TOOTH EXTRACTION      There were no vitals filed for this visit.   Subjective Assessment - 02/20/21 1623    Subjective Doing okay.  Would like to be able to go to a gym in the future    Currently in Pain? No/denies    Aggravating Factors  crossing legs, having them bent up                             [facility] Adult PT Treatment/Exercise - 02/20/21 0001      Knee/Hip Exercises: Stretches   Passive Hamstring Stretch Both;3 reps;20 seconds    ITB Stretch Both;3 reps;20 seconds    Piriformis Stretch Both;3 reps;20 seconds      Knee/Hip Exercises: Aerobic   Nustep L 5 5 min - LE only      Knee/Hip Exercises: Machines  for Strengthening   Cybex Knee Extension 15# 2 sets 10 with ball squeze    Cybex Knee Flexion 25# 2x10    Cybex Leg Press 40# 3 sets 10 feet 3 way      Knee/Hip Exercises: Standing   Walking with Sports Cord all directions    Other Standing Knee Exercises 8# SL DL      Knee/Hip Exercises: Supine   Bridges with Ball Squeeze 2 sets;10 reps    Bridges with Clamshell 2 sets;10 reps    Other Supine Knee/Hip Exercises feet on ball K2C, bridges, isometric abs                    PT Short Term Goals - 02/14/21 1651      PT SHORT TERM GOAL #1   Title Pt will be I with initial HEP    Status Achieved             PT Long Term Goals - 02/08/21 1122      PT LONG TERM GOAL #1   Title Pt will be I with advanced HEP    Time 6  Period Weeks    Status New    Target Date 03/22/21      PT LONG TERM GOAL #2   Title Pt will report 50% reduction in B knee pain    Time 6    Period Weeks    Status New    Target Date 03/22/21      PT LONG TERM GOAL #3   Title Pt will report able to work entire shift at [employer] with </= 1/10 B knee pain    Time 6    Period Weeks    Status New    Target Date 03/22/21      PT LONG TERM GOAL #4   Title Pt will demo improved B hip ext/abd MMT strength to 5/5    Baseline 4/5    Time 6    Period Weeks    Status New    Target Date 03/22/21                 Plan - 02/20/21 1705    Clinical Impression Statement Patient reports no pain, however with the increased activity there was some definite shaking of the mms that were noted.  She does have some tightness in the LE's and weakness in the hips and knees.On the leg extension needed a lot of cues to get full extension and to go slower.    PT Next Visit Plan continue to progress with strength  and function    Consulted and Agree with Plan of Care Patient           Patient will benefit from skilled therapeutic intervention in order to improve the following deficits and  impairments:  Abnormal gait,Difficulty walking,Increased muscle spasms,Decreased activity tolerance,Pain,Hypomobility,Decreased strength,Postural dysfunction  Visit Diagnosis: Chronic pain of left knee  Chronic pain of right knee  Muscle weakness (generalized)  Cramp and spasm     Problem List Patient Active Problem List   Diagnosis Date Noted  . Patellofemoral pain syndrome of both knees 10/27/2020  . Post-operative state 07/16/2017  . Gestational hypertension 06/25/2017  . Sickle cell trait (HCC) 11/13/2016  . Low vitamin D level 11/13/2016  . Asthma exacerbation, mild 11/05/2016    Jearld Lesch., PT 02/20/2021, 5:11 PM  [facility] [address] [place], [place], [address] Phone: [phone]   Fax:  [phone]  Name: Stacey Reid MRN: [id number] Date of Birth: [birth year]

## 2021-02-23 ENCOUNTER — Other Ambulatory Visit: Payer: Self-pay

## 2021-02-23 ENCOUNTER — Ambulatory Visit: Payer: Medicaid Other | Attending: Family Medicine | Admitting: Physical Therapy

## 2021-02-23 DIAGNOSIS — M25561 Pain in right knee: Secondary | ICD-10-CM | POA: Insufficient documentation

## 2021-02-23 DIAGNOSIS — M6281 Muscle weakness (generalized): Secondary | ICD-10-CM | POA: Diagnosis present

## 2021-02-23 DIAGNOSIS — G8929 Other chronic pain: Secondary | ICD-10-CM | POA: Diagnosis present

## 2021-02-23 DIAGNOSIS — M25562 Pain in left knee: Secondary | ICD-10-CM | POA: Insufficient documentation

## 2021-02-23 NOTE — Therapy (Signed)
[facility]. [place], [place], [address] Phone: [phone]   Fax:  [phone]  Physical Therapy Treatment  Patient Details  Name: Stacey Reid MRN: [id number] Date of Birth: Sep 11, 1996 Referring Provider (PT): Clinton Quant Date: 02/23/2021   PT End of Session - 02/23/21 1635    Visit Number 4    Date for PT Re-Evaluation 04/08/21    PT Start Time 1400    PT Stop Time 1440    PT Time Calculation (min) 40 min           Past Medical History:  Diagnosis Date  . Anemia affecting pregnancy 03/21/2017  . Asthma    years ago, exercise induced  . Headache    with pregnancy  . Heart murmur    history of, resolved 3 months later  . Hyperemesis 2016  . Hypertension   . Infection    UTI  . Pregnancy induced hypertension   . Syncope    in high school, found out had a heart murmur    Past Surgical History:  Procedure Laterality Date  . LAPAROSCOPIC TUBAL LIGATION Bilateral 07/02/2017   Procedure: LAPAROSCOPIC TUBAL LIGATION - Filshie clips;  Surgeon: Chancy Milroy, MD;  Location: [facility];  Service: Gynecology;  Laterality: Bilateral;  . WISDOM TOOTH EXTRACTION      There were no vitals filed for this visit.   Subjective Assessment - 02/23/21 1600    Subjective pain comes and goes, we have a gym I have been going too and doing ex    Currently in Pain? No/denies                             [facility] Adult PT Treatment/Exercise - 02/23/21 0001      Knee/Hip Exercises: Aerobic   Elliptical 3 min fwd/3 min back L 4    Tread Mill 2 min each side 1 mhp      Knee/Hip Exercises: Machines for Strengthening   Cybex Knee Extension 15# eccentric lowering 10 each leg      Knee/Hip Exercises: Standing   Lateral Step Up Both;15 reps;Hand Hold: 0;Step Height: 6"   opp leg abd with red tband   Forward Step Up Both;15 reps;Hand Hold: 0;Step Height: 6"   opp leg ext with red tband    Functional Squat 2 sets;10 reps;3 seconds   BOSU   Wall Squat 1 set;10 reps;5 seconds   with ball squeeze   Walking with Sports Cord running man BIL fwd and laterally 20 each 40#    Other Standing Knee Exercises curtsy lunge 20 x      Knee/Hip Exercises: Seated   Sit to Sand 10 reps;without UE support   SL                   PT Short Term Goals - 02/14/21 1651      PT SHORT TERM GOAL #1   Title Pt will be I with initial HEP    Status Achieved             PT Long Term Goals - 02/23/21 1631      PT LONG TERM GOAL #1   Title Pt will be I with advanced HEP    Baseline started back at gym    Status Partially Met      PT LONG TERM GOAL #2   Title Pt will report 50%  reduction in B knee pain    Baseline inconsistant    Status Partially Met      PT LONG TERM GOAL #3   Title Pt will report able to work entire shift at [employer] with </= 1/10 B knee pain    Status On-going      PT LONG TERM GOAL #4   Title Pt will demo improved B hip ext/abd MMT strength to 5/5    Status On-going                 Plan - 02/23/21 1635    Clinical Impression Statement no pain but c/o weakness and fatigue with ex,  increased dynamic exercise today and she did well, less shaking noted today vs last time this PTA worked with pt. progressing with LTGS and pt  has returned to gym    PT Treatment/Interventions ADLs/Self Care Home Management;Electrical Stimulation;Iontophoresis 28m/ml Dexamethasone;Ultrasound;Neuromuscular re-education;Balance training;Therapeutic exercise;Therapeutic activities;Stair training;Patient/family education;Manual techniques;Passive range of motion;Taping;Vasopneumatic Device    PT Next Visit Plan continue to progress with strength  and function           Patient will benefit from skilled therapeutic intervention in order to improve the following deficits and impairments:  Abnormal gait,Difficulty walking,Increased muscle spasms,Decreased activity  tolerance,Pain,Hypomobility,Decreased strength,Postural dysfunction  Visit Diagnosis: Chronic pain of left knee  Chronic pain of right knee  Muscle weakness (generalized)     Problem List Patient Active Problem List   Diagnosis Date Noted  . Patellofemoral pain syndrome of both knees 10/27/2020  . Post-operative state 07/16/2017  . Gestational hypertension 06/25/2017  . Sickle cell trait ([place] 11/13/2016  . Low vitamin D level 11/13/2016  . Asthma exacerbation, mild 11/05/2016    Shivani Barrantes,ANGIE PTA 02/23/2021, 4:38 PM  [place] [address]Phone: [phone]  Fax:  [phone] Name: Stacey LAKINSMRN: [id number] of Birth: 311-Apr-1997

## 2021-02-27 ENCOUNTER — Ambulatory Visit: Payer: Medicaid Other | Admitting: Physical Therapy

## 2021-03-02 ENCOUNTER — Ambulatory Visit: Payer: Medicaid Other | Admitting: Physical Therapy

## 2021-03-09 ENCOUNTER — Ambulatory Visit: Payer: Medicaid Other | Admitting: Physical Therapy

## 2021-04-11 ENCOUNTER — Encounter (HOSPITAL_COMMUNITY): Payer: Self-pay | Admitting: Emergency Medicine

## 2021-04-11 ENCOUNTER — Emergency Department (HOSPITAL_COMMUNITY)
Admission: EM | Admit: 2021-04-11 | Discharge: 2021-04-11 | Disposition: A | Discharge status: Home / Self Care | Payer: Medicaid Other | Attending: Emergency Medicine | Admitting: Emergency Medicine

## 2021-04-11 ENCOUNTER — Emergency Department (HOSPITAL_COMMUNITY): Payer: Medicaid Other

## 2021-04-11 ENCOUNTER — Other Ambulatory Visit: Payer: Self-pay

## 2021-04-11 DIAGNOSIS — R002 Palpitations: Secondary | ICD-10-CM | POA: Diagnosis not present

## 2021-04-11 DIAGNOSIS — Z5321 Procedure and treatment not carried out due to patient leaving prior to being seen by health care provider: Secondary | ICD-10-CM | POA: Insufficient documentation

## 2021-04-11 DIAGNOSIS — R42 Dizziness and giddiness: Secondary | ICD-10-CM | POA: Insufficient documentation

## 2021-04-11 DIAGNOSIS — R519 Headache, unspecified: Secondary | ICD-10-CM | POA: Diagnosis not present

## 2021-04-11 DIAGNOSIS — R079 Chest pain, unspecified: Secondary | ICD-10-CM | POA: Insufficient documentation

## 2021-04-11 LAB — BASIC METABOLIC PANEL
Anion gap: 7 (ref 5–15)
BUN: 9 mg/dL (ref 6–20)
CO2: 25 mmol/L (ref 22–32)
Calcium: 9.1 mg/dL (ref 8.9–10.3)
Chloride: 107 mmol/L (ref 98–111)
Creatinine, Ser: 0.82 mg/dL (ref 0.44–1.00)
GFR, Estimated: >60 mL/min (ref >60)
Glucose, Bld: 95 mg/dL (ref 70–99)
Potassium: 4.1 mmol/L (ref 3.5–5.1)
Sodium: 139 mmol/L (ref 135–145)

## 2021-04-11 LAB — TROPONIN I (HIGH SENSITIVITY): Troponin I (High Sensitivity): <2 ng/L (ref <18)

## 2021-04-11 LAB — CBC
HCT: 39.5 % (ref 36.0–46.0)
Hemoglobin: 13.4 g/dL (ref 12.0–15.0)
MCH: 30.9 pg (ref 26.0–34.0)
MCHC: 33.9 g/dL (ref 30.0–36.0)
MCV: 91 fL (ref 80.0–100.0)
Platelets: 322 10*3/uL (ref 150–400)
RBC: 4.34 MIL/uL (ref 3.87–5.11)
RDW: 11.1 % — ABNORMAL LOW (ref 11.5–15.5)
WBC: 4.9 10*3/uL (ref 4.0–10.5)
nRBC: 0 % (ref 0.0–0.2)

## 2021-04-11 LAB — I-STAT BETA HCG BLOOD, ED (MC, WL, AP ONLY): I-stat hCG, quantitative: <5 m[IU]/mL (ref <5)

## 2021-04-11 NOTE — ED Notes (Signed)
Patient called x2 for vitals recheck with no response and not visible in lobby 

## 2021-04-11 NOTE — ED Triage Notes (Signed)
Emergency Medicine Provider Triage Evaluation Note  Stacey Reid , a 25 y.o. female  was evaluated in triage.  Pt complains of headache and chest pain for 3 weeks is constant.  Review of Systems  Positive: CP, HA Negative: SOB  Physical Exam  BP 122/80 (BP Location: Right Arm)   Pulse 76   Temp 98.7 F (37.1 C) (Oral)   Resp 14   SpO2 100%  Gen:   Awake, no distress   HEENT:  Atraumatic  Resp:  Normal effort  Cardiac:  Normal rate  Abd:   Nondistended, nontender  MSK:   Moves extremities without difficulty  Neuro:  Speech clear, normal EOMS Medical Decision Making  Medically screening exam initiated at 3:41 PM.  Appropriate orders placed.  [name] was informed that the remainder of the evaluation will be completed by another provider, this initial triage assessment does not replace that evaluation, and the importance of remaining in the ED until their evaluation is complete.  Clinical Impression  MSE was initiated and I personally evaluated the patient and placed orders (if any) at  3:45 PM on April 11, 2021.  The patient appears stable so that the remainder of the MSE may be completed by another provider.    Farrel Gordon, PA-C 04/11/21 1546

## 2021-04-11 NOTE — ED Notes (Signed)
Pt name called 1 time for VS with no answer

## 2021-04-11 NOTE — ED Triage Notes (Signed)
Pt c/o posterior headache that has been constant x 3 weeks. Also c/o intermittent chest pain x 3 weeks as well. States she sometimes feels lightheaded and like her heart is racing.

## 2021-05-07 IMAGING — DX DG CHEST 1V PORT
1 series · 1 of 1 positions shown · non-contrast
Comparison: Radiograph 04/16/2013

CLINICAL DATA: Chest tightness, diagnosed with [id number] 01/08/2020

EXAM:
PORTABLE CHEST 1 VIEW

[chest ap]
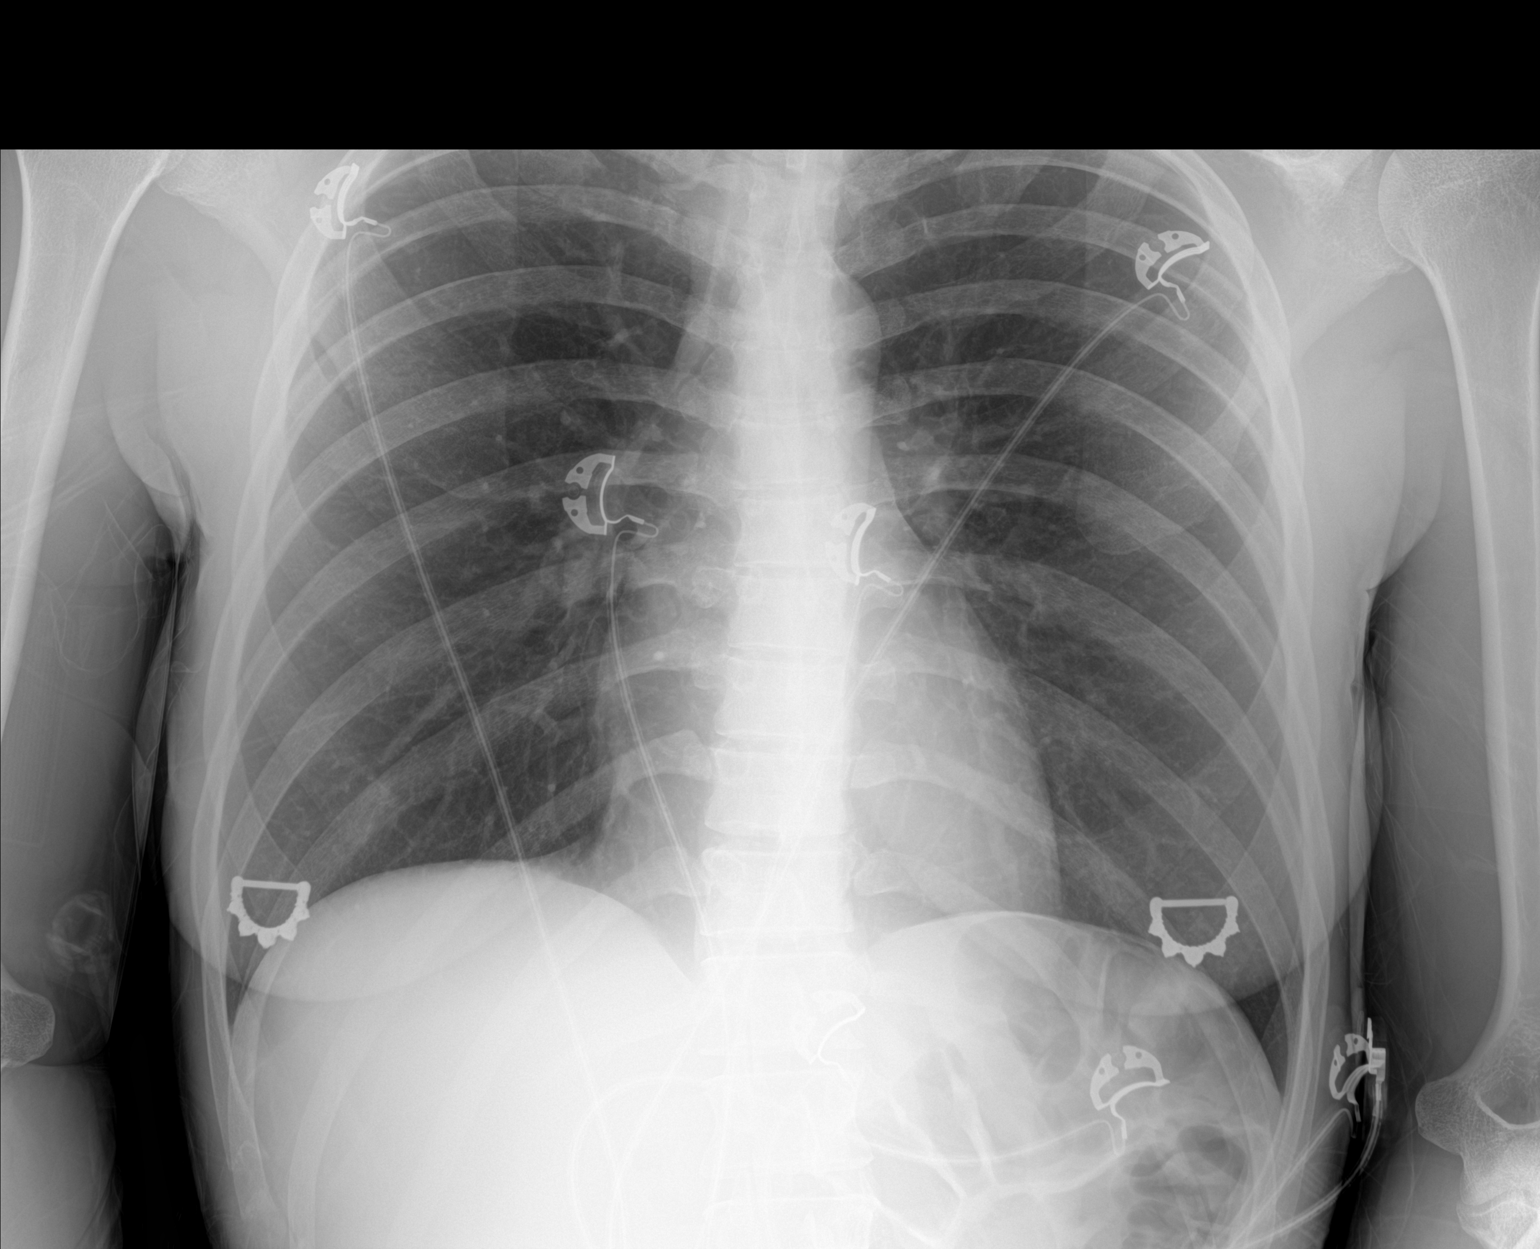

[1 of 1 positions shown; findings below may reference images not displayed]

FINDINGS: No consolidation, features of edema, pneumothorax, or effusion.
Pulmonary vascularity is normally distributed. The cardiomediastinal
contours are unremarkable. No acute osseous or soft tissue
abnormality. Bilateral metallic nipple ornamentation is noted.
Telemetry leads overlie the chest.
IMPRESSION: Negative for acute cardiopulmonary disease.

## 2021-05-29 IMAGING — CT CT ANGIO CHEST
2 of 7 series · 19 of 46 positions shown · IV contrast (APPLIED)
Comparison: Chest radiographs 02/12/2020. Chest CTA 01/15/2020.

CLINICAL DATA: 23-year-old female with chest pain and shortness of
breath for 1 week. Recent [id number].

EXAM:
CT ANGIOGRAPHY CHEST WITH CONTRAST
TECHNIQUE: Multidetector CT imaging of the chest was performed using the
standard protocol during bolus administration of intravenous
contrast. Multiplanar CT image reconstructions and MIPs were
obtained to evaluate the vascular anatomy.
CONTRAST:  80mL OMNIPAQUE IOHEXOL 350 MG/ML SOLN

[Series 7: thins · axial · 0.69mm/px · z∈[-324,-63]mm · 16 of 422 slices shown]
[im 24/422  lung]
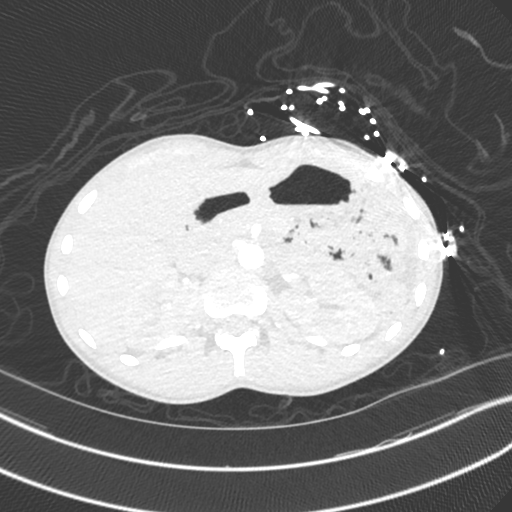
[im 47/422  soft-tissue]
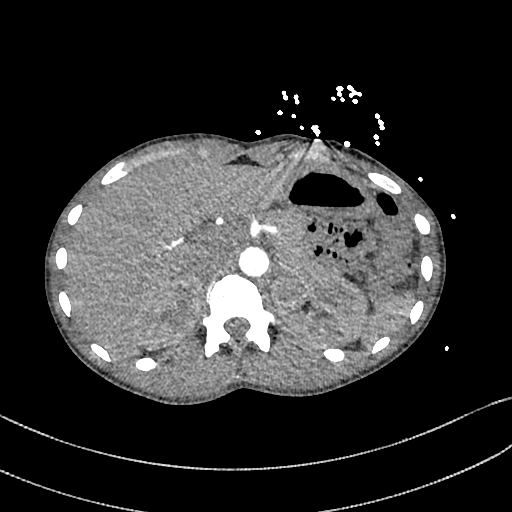
[im 71/422  lung]
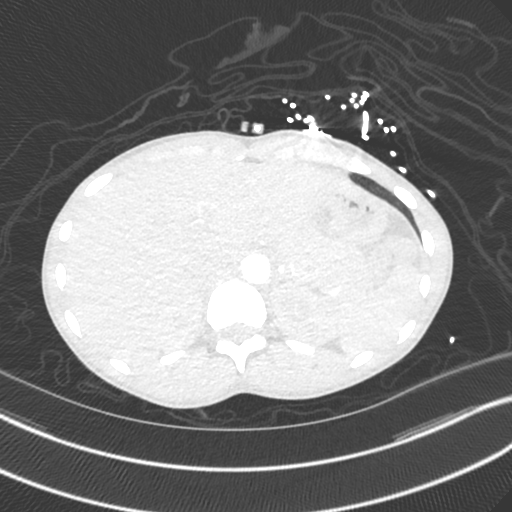
[im 94/422  soft-tissue]
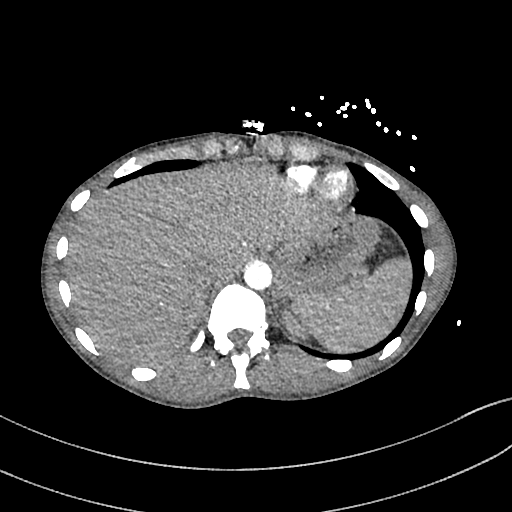
[im 117/422  lung]
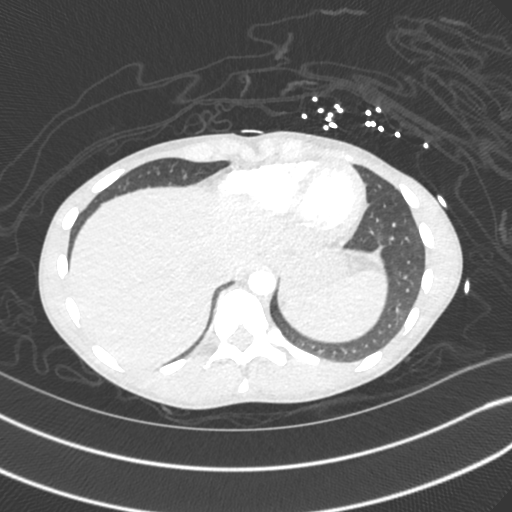
[im 141/422  soft-tissue]
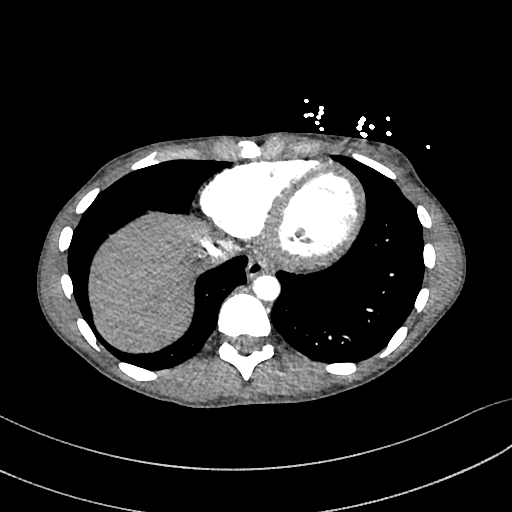
[im 164/422  lung]
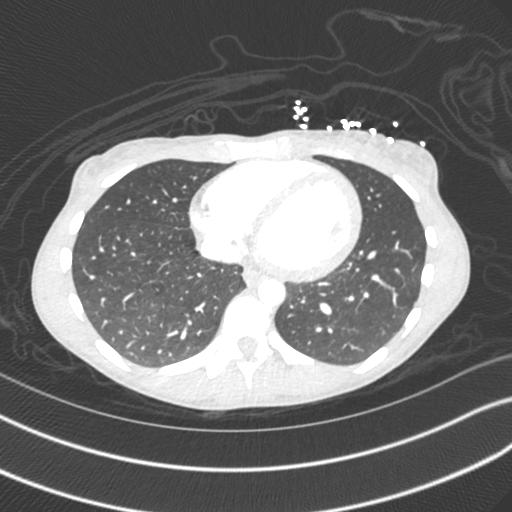
[im 188/422  soft-tissue]
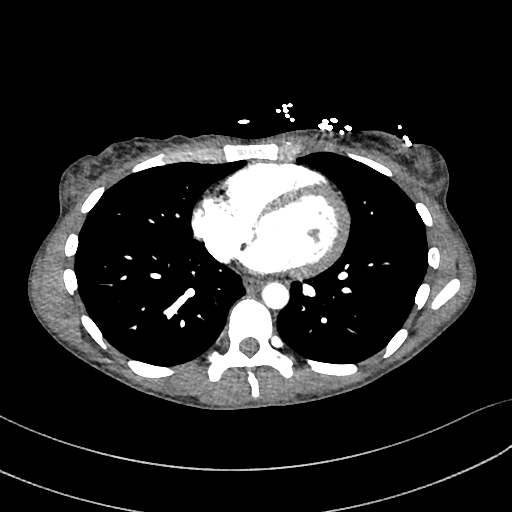
[im 234/422  lung]
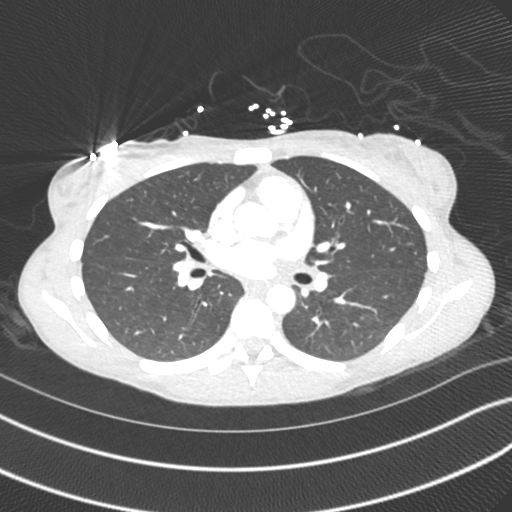
[im 258/422  soft-tissue]
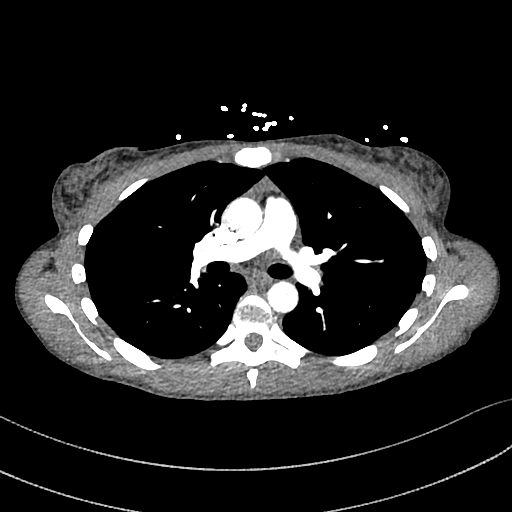
[im 281/422  lung]
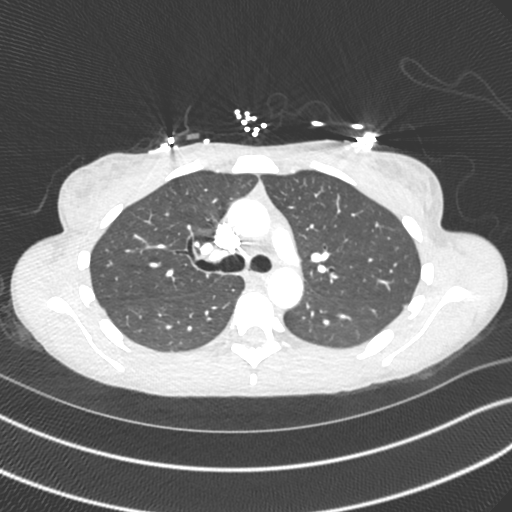
[im 305/422  soft-tissue]
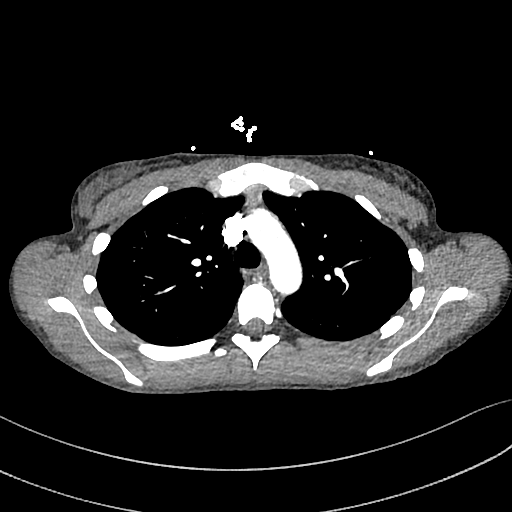
[im 328/422  lung]
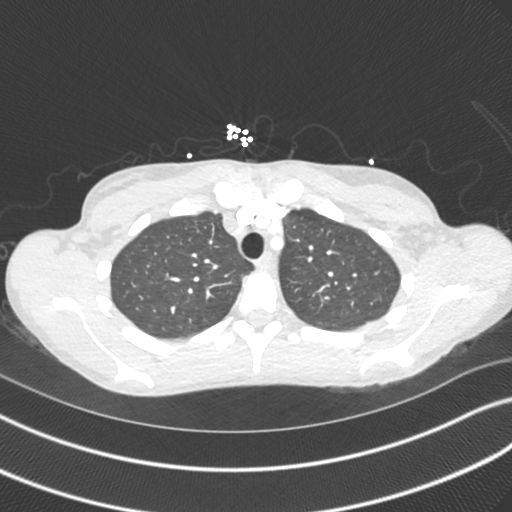
[im 351/422  soft-tissue]
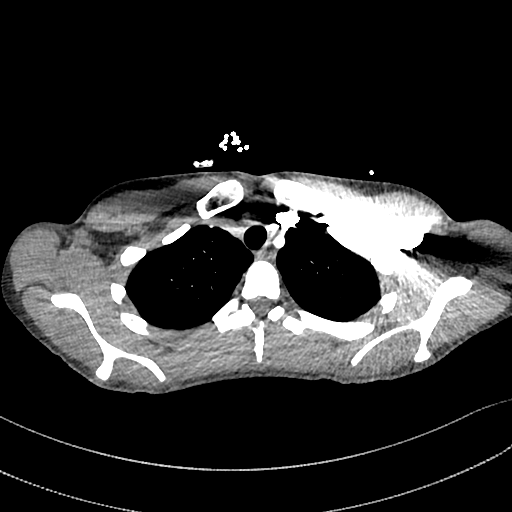
[im 375/422  lung]
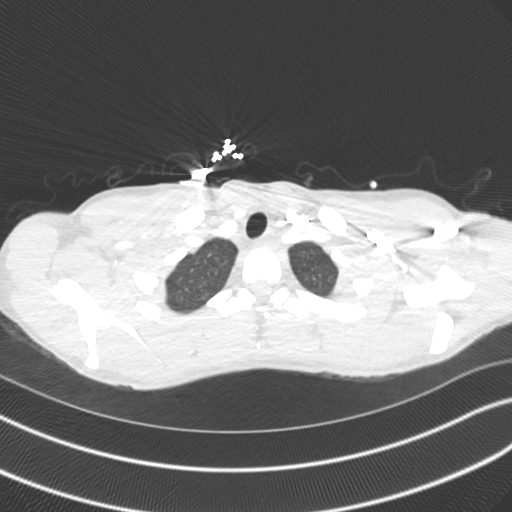
[im 398/422  soft-tissue]
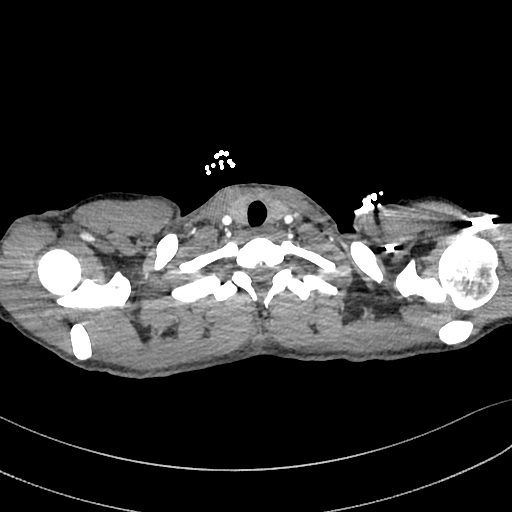

[Series 8: cor · coronal · 0.62mm/px · 3 of 100 slices shown]
[im 25/100  soft-tissue]
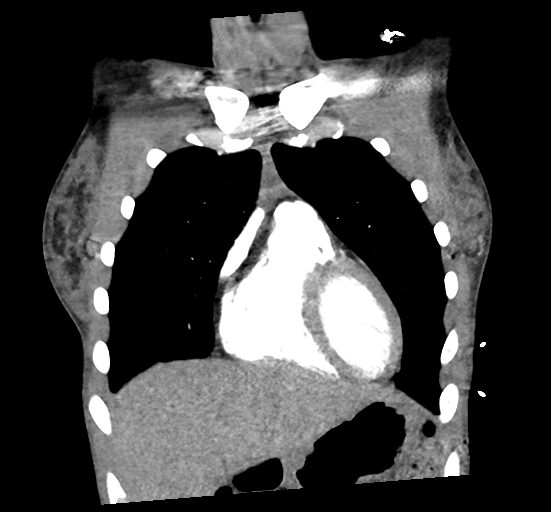
[im 50/100  soft-tissue]
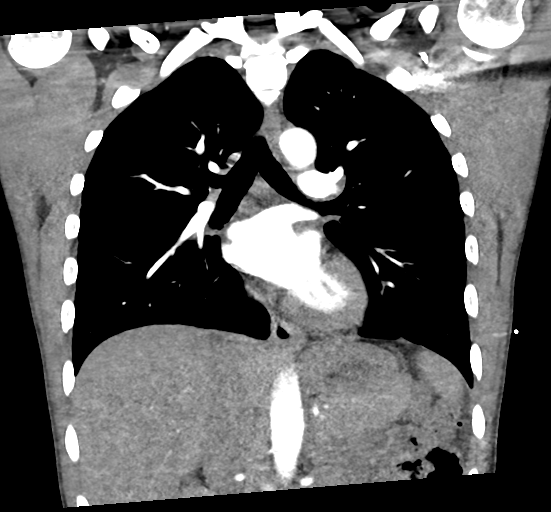
[im 75/100  soft-tissue]
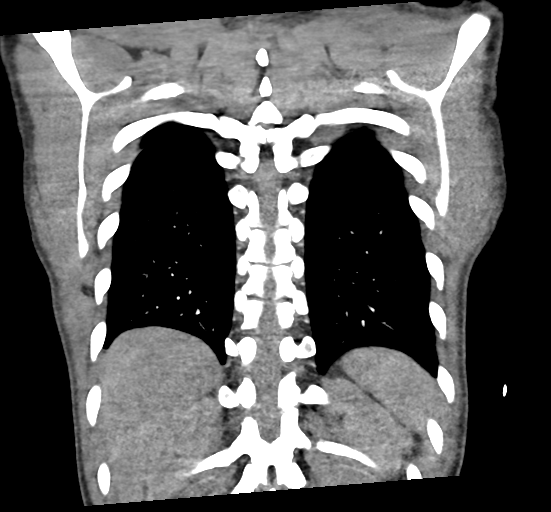

[19 of 46 positions shown; findings below may reference images not displayed]

FINDINGS: Cardiovascular: Good contrast bolus timing in the pulmonary arterial
tree.

No focal filling defect identified in the pulmonary arteries to
suggest acute pulmonary embolism.

No cardiomegaly or pericardial effusion. Negative visible aorta. No
calcified coronary artery atherosclerosis is evident.

Mediastinum/Nodes: Negative. Small volume residual thymus suspected.

Lungs/Pleura: Stable lung volumes. Major airways are patent. The
lungs appear stable and clear. No pleural effusion or abnormal
pulmonary opacity.

Upper Abdomen: Negative visible liver, spleen, pancreas, kidneys and
bowel.

Musculoskeletal: Negative.

Review of the MIP images confirms the above findings.
IMPRESSION: Normal chest CTA. Negative for acute pulmonary embolus.

## 2021-06-02 ENCOUNTER — Other Ambulatory Visit: Payer: Self-pay

## 2021-06-02 ENCOUNTER — Emergency Department (HOSPITAL_COMMUNITY)
Admission: EM | Admit: 2021-06-02 | Discharge: 2021-06-03 | Disposition: A | Discharge status: Home / Self Care | Payer: Medicaid Other | Attending: Emergency Medicine | Admitting: Emergency Medicine

## 2021-06-02 DIAGNOSIS — J45909 Unspecified asthma, uncomplicated: Secondary | ICD-10-CM | POA: Diagnosis not present

## 2021-06-02 DIAGNOSIS — R0982 Postnasal drip: Secondary | ICD-10-CM | POA: Diagnosis not present

## 2021-06-02 DIAGNOSIS — J029 Acute pharyngitis, unspecified: Secondary | ICD-10-CM | POA: Insufficient documentation

## 2021-06-02 DIAGNOSIS — I10 Essential (primary) hypertension: Secondary | ICD-10-CM | POA: Diagnosis not present

## 2021-06-02 LAB — GROUP A STREP BY PCR: Group A Strep by PCR: NOT DETECTED

## 2021-06-02 NOTE — ED Triage Notes (Signed)
Pt cam in with c/o sore throat that started on Tue. Pt tonsils appear reddened. Pt also endorses headache

## 2021-06-02 NOTE — ED Provider Notes (Signed)
Emergency Medicine Provider Triage Evaluation Note  Stacey Reid , a 25 y.o. female  was evaluated in triage.  Pt complains of sore throat for the past few days. Constant. No significant alleviating factors. No sick contacts w similar.  Review of Systems  Positive: Sore throat, post nasal drip Negative: Cough, dyspnea, chest pain, fever  Physical Exam  BP (!) 131/95 (BP Location: Left Arm)   Pulse 88   Temp 98.8 F (37.1 C) (Oral)   Resp 16   Ht 5\' 3"  (1.6 m)   Wt 68 kg   SpO2 99%   BMI 26.57 kg/m  Gen:   Awake, no distress   Resp:  Normal effort  MSK:   Moves extremities without difficulty  Other:  Mild posterior oropharyngeal erythema. Posterior oropharynx is symmetric appearing. Patient tolerating own secretions without difficulty. No trismus. No drooling. No hot potato voice. No swelling beneath the tongue, submandibular compartment is soft.   Medical Decision Making  Medically screening exam initiated at 10:29 PM.  Appropriate orders placed.  Peach was informed that the remainder of the evaluation will be completed by another provider, this initial triage assessment does not replace that evaluation, and the importance of remaining in the ED until their evaluation is complete.  Strep test ordered.   Impression: sore throat.     Christianne Borrow, PA-C 06/02/21 2230    08/02/21, DO 06/02/21 2236

## 2021-06-02 NOTE — ED Provider Notes (Signed)
[facility]-EMERGENCY DEPT Provider Note   CSN: [id number] Arrival date & time: 06/02/21  2156     History Chief Complaint  Patient presents with   Sore Throat    Stacey Reid is a 25 y.o. female with a history of asthma, anemia, and hypertension who presents to the emergency department with complaints of a sore throat over the past few days.  Patient states discomfort is constant, aggravated with swallowing but able to swallow, tried zyrtec without much relief, also  having associated nasal congestion and postnasal drip that required her to clear her throat at times.  She denies fever, ear pain, dyspnea, cough, or vomiting.  No recent sick contacts with similar symptoms. Denies chance of pregnancy.   HPI     Past Medical History:  Diagnosis Date   Anemia affecting pregnancy 03/21/2017   Asthma    years ago, exercise induced   Headache    with pregnancy   Heart murmur    history of, resolved 3 months later   Hyperemesis 2016   Hypertension    Infection    UTI   Pregnancy induced hypertension    Syncope    in high school, found out had a heart murmur    Patient Active Problem List   Diagnosis Date Noted   Patellofemoral pain syndrome of both knees 10/27/2020   Post-operative state 07/16/2017   Gestational hypertension 06/25/2017   Sickle cell trait (HCC) 11/13/2016   Low vitamin D level 11/13/2016   Asthma exacerbation, mild 11/05/2016    Past Surgical History:  Procedure Laterality Date   LAPAROSCOPIC TUBAL LIGATION Bilateral 07/02/2017   Procedure: LAPAROSCOPIC TUBAL LIGATION - Filshie clips;  Surgeon: Hermina Staggers, MD;  Location: [facility];  Service: Gynecology;  Laterality: Bilateral;   WISDOM TOOTH EXTRACTION       OB History     Gravida  3   Para  2   Term  2   Preterm  0   AB  1   Living  2      SAB  1   IAB  0   Ectopic  0   Multiple  0   Live Births  2           Family History  Problem Relation  Age of Onset   Asthma Mother    Hypertension Mother    Miscarriages / [place] Mother    Depression Mother    Lupus Mother    Healthy Father     Social History   Tobacco Use   Smoking status: Never   Smokeless tobacco: Never  Vaping Use   Vaping Use: Never used  Substance Use Topics   Alcohol use: Yes    Comment: occ.   Drug use: Not Currently    Types: Marijuana    Comment: quit 12/2019    Home Medications Prior to Admission medications   Medication Sig Start Date End Date Taking? Authorizing Provider  albuterol (VENTOLIN HFA) 108 (90 Base) MCG/ACT inhaler Inhale 1-2 puffs into the lungs every 6 (six) hours as needed for shortness of breath. 09/15/19   [provider]  diclofenac Sodium (VOLTAREN) 1 % GEL Apply 2 g topically 4 (four) times daily. 06/18/20   Caccavale, Sophia, PA-C  famotidine (PEPCID) 20 MG tablet Take 1 tablet (20 mg total) by mouth daily. Patient not taking: Reported on 11/09/2020 06/18/20   Caccavale, Sophia, PA-C  FLUoxetine (PROZAC) 10 MG capsule Take 10 mg by mouth  2 (two) times daily. 02/04/20   [provider]  hydrOXYzine (ATARAX/VISTARIL) 25 MG tablet Take 25 mg by mouth 4 (four) times daily as needed for anxiety. Patient not taking: Reported on 11/09/2020 02/04/20   [provider]  ibuprofen (ADVIL) 100 MG/5ML suspension Take 600 mg by mouth every 4 (four) hours as needed for mild pain. Patient not taking: Reported on 11/09/2020    [provider]  Multiple Vitamin (MULTIVITAMIN WITH MINERALS) TABS tablet Take 1 tablet by mouth daily. Patient not taking: Reported on 11/09/2020    [provider]  naproxen (NAPROSYN) 375 MG tablet Take 1 tablet twice daily as needed for pain. Patient not taking: Reported on 11/09/2020 04/28/20   Molpus, Jonny Ruiz, MD  metoCLOPramide (REGLAN) 10 MG tablet Take 1 tablet (10 mg total) by mouth every 6 (six) hours. Patient not taking: Reported on 03/16/2020 02/08/20 04/28/20  Anselm Pancoast, PA-C    Allergies    Patient has no known allergies.  Review of Systems   Review of Systems  Constitutional:  Negative for chills and fever.  HENT:  Positive for congestion and sore throat. Negative for ear pain.   Respiratory:  Negative for cough and shortness of breath.   Cardiovascular:  Negative for chest pain.  Gastrointestinal:  Negative for abdominal pain.  Neurological:  Negative for syncope.  All other systems reviewed and are negative.  Physical Exam Updated Vital Signs BP (!) 131/95 (BP Location: Left Arm)   Pulse 88   Temp 98.8 F (37.1 C) (Oral)   Resp 16   Ht 5\' 3"  (1.6 m)   Wt 68 kg   SpO2 99%   BMI 26.57 kg/m   Physical Exam Vitals and nursing note reviewed.  Constitutional:      General: She is not in acute distress.    Appearance: She is well-developed.  HENT:     Head: Normocephalic and atraumatic.     Right Ear: Ear canal normal. Tympanic membrane is not perforated, erythematous, retracted or bulging.     Left Ear: Ear canal normal. Tympanic membrane is not perforated, erythematous, retracted or bulging.     Ears:     Comments: No mastoid erythema/swelling/tenderness.     Nose:     Right Sinus: No maxillary sinus tenderness or frontal sinus tenderness.     Left Sinus: No maxillary sinus tenderness or frontal sinus tenderness.     Comments: Mildly boggy turbinates.     Mouth/Throat:     Pharynx: Uvula midline. No oropharyngeal exudate or posterior oropharyngeal erythema.     Comments: Posterior oropharyngeal erythema present. Posterior oropharynx is symmetric appearing. Patient tolerating own secretions without difficulty. No trismus. No drooling. No hot potato voice. No swelling beneath the tongue, submandibular compartment is soft.  Eyes:     General:        Right eye: No discharge.        Left eye: No discharge.     Conjunctiva/sclera: Conjunctivae normal.     Pupils: Pupils are equal, round, and reactive to light.  Cardiovascular:      Rate and Rhythm: Normal rate and regular rhythm.     Heart sounds: No murmur heard. Pulmonary:     Effort: Pulmonary effort is normal. No respiratory distress.     Breath sounds: Normal breath sounds. No wheezing, rhonchi or rales.  Abdominal:     General: There is no distension.     Palpations: Abdomen is soft.     Tenderness:  There is no abdominal tenderness.  Musculoskeletal:     Cervical back: Normal range of motion and neck supple. No edema or rigidity.  Lymphadenopathy:     Cervical: No cervical adenopathy.  Skin:    General: Skin is warm and dry.     Findings: No rash.  Neurological:     Mental Status: She is alert.  Psychiatric:        Behavior: Behavior normal.    ED Results / Procedures / Treatments   Labs (all labs ordered are listed, but only abnormal results are displayed) Labs Reviewed  GROUP A STREP BY PCR    EKG None  Radiology No results found.  Procedures Procedures   Medications Ordered in ED Medications  dexamethasone (DECADRON) injection 10 mg (has no administration in time range)  lidocaine (XYLOCAINE) 2 % viscous mouth solution 15 mL (has no administration in time range)    ED Course  I have reviewed the triage vital signs and the nursing notes.  Pertinent labs & imaging results that were available during my care of the patient were reviewed by me and considered in my medical decision making (see chart for details).    MDM Rules/Calculators/A&P                          Patient presents to the ED with complaints of sore throat. Nontoxic, resting comfortably, vitals WN with the exception of somewhat elevated BP- doubt HTN emergency.   Additional history obtained:  Additional history obtained from chart review & nursing note review.   Lab Tests:  I Ordered, reviewed, and interpreted labs, which included:  Strep test: Negative  ED Course:  Afebrile, no sinus tenderness, sxs < 7 days, doubt acute bacterial sinusitis. Strep negative.  Exam not consistent w/ RPA/PTA. No signs of AOM/AOE/mastoiditis. No nuchal rigidity. Lungs clear- doubt CAP. Likely viral vs.allergic. Supportive care.Decadron and viscous lidocaine ordered in the ED. I discussed results, treatment plan, need for follow-up, and return precautions with the patient. Provided opportunity for questions, patient confirmed understanding and is in agreement with plan.   Portions of this note were generated with Scientist, clinical (histocompatibility and immunogenetics). Dictation errors may occur despite best attempts at proofreading.  Final Clinical Impression(s) / ED Diagnoses Final diagnoses:  Sore throat    Rx / DC Orders ED Discharge Orders          Ordered    fluticasone (FLONASE) 50 MCG/ACT nasal spray  Daily        06/03/21 0046    naproxen (NAPROSYN) 500 MG tablet  2 times daily PRN        06/03/21 0046    lidocaine (XYLOCAINE) 2 % solution  Every 4 hours PRN        06/03/21 0046             Cherly Anderson, PA-C 06/03/21 0049    Nira Conn, MD 06/03/21 [phone]

## 2021-06-03 ENCOUNTER — Encounter (HOSPITAL_COMMUNITY): Payer: Self-pay | Admitting: Student

## 2021-06-03 MED ORDER — NAPROXEN 500 MG PO TABS: 500.0000 mg | ORAL_TABLET | Freq: Two times a day (BID) | ORAL | 0 refills | Status: DC | PRN

## 2021-06-03 MED ORDER — DEXAMETHASONE SODIUM PHOSPHATE 10 MG/ML IJ SOLN
10.0000 mg | Freq: Once | INTRAMUSCULAR | Status: AC
  Administered 2021-06-03: 10 mg via INTRAMUSCULAR
  Filled 2021-06-03: qty 1

## 2021-06-03 MED ORDER — LIDOCAINE VISCOUS HCL 2 % MT SOLN: 15.0000 mL | OROMUCOSAL | 0 refills | Status: DC | PRN

## 2021-06-03 MED ORDER — LIDOCAINE VISCOUS HCL 2 % MT SOLN
15.0000 mL | Freq: Once | OROMUCOSAL | Status: AC
  Administered 2021-06-03: 15 mL via ORAL
  Filled 2021-06-03: qty 15

## 2021-06-03 MED ORDER — FLUTICASONE PROPIONATE 50 MCG/ACT NA SUSP: 1.0000 | Freq: Every day | NASAL | 0 refills | Status: DC

## 2021-06-03 NOTE — Discharge Instructions (Addendum)
You were seen in the emergency department today for a sore throat.  Your strep test was negative.  You were given a shot of steroids in the ER to help with the inflammation and congestion.  We are sending you home with the following medicines: - Flonase: Use 1 spray per nostril daily to help with congestion - Naproxen: Take every 12 hours as needed for pain, take this with food as it can cause stomach upset and or stomach bleeding.  Do not take other NSAIDs such as Motrin, Advil, Aleve, ibuprofen, Goody powder, Mobic, etc. as these are similar medicines. - Viscous lidocaine: May take 15 mL every 4 hours as needed for pain.  We have prescribed you new medication(s) today. Discuss the medications prescribed today with your pharmacist as they can have adverse effects and interactions with your other medicines including over the counter and prescribed medications. Seek medical evaluation if you start to experience new or abnormal symptoms after taking one of these medicines, seek care immediately if you start to experience difficulty breathing, feeling of your throat closing, facial swelling, or rash as these could be indications of a more serious allergic reaction  You may take Tylenol continue to take Zyrtec with these medicines.  Please follow-up with your primary care provider within 3 days.  Return to the ER for new or worsening symptoms including but not limited to new or worsening pain, inability to keep fluids down, fever, pain localizing to one side of your throat, vomiting, trouble breathing, or any other concerns.

## 2021-06-12 IMAGING — DX DG CHEST 2V
2 series · 2 of 2 positions shown · non-contrast
Comparison: Most recent chest CT 02/14/2020, most recent radiograph
02/12/2020

CLINICAL DATA: Left-sided chest pain. Shoulder pain. COVID positive
December 2019.

EXAM:
CHEST - 2 VIEW

[chest pa]
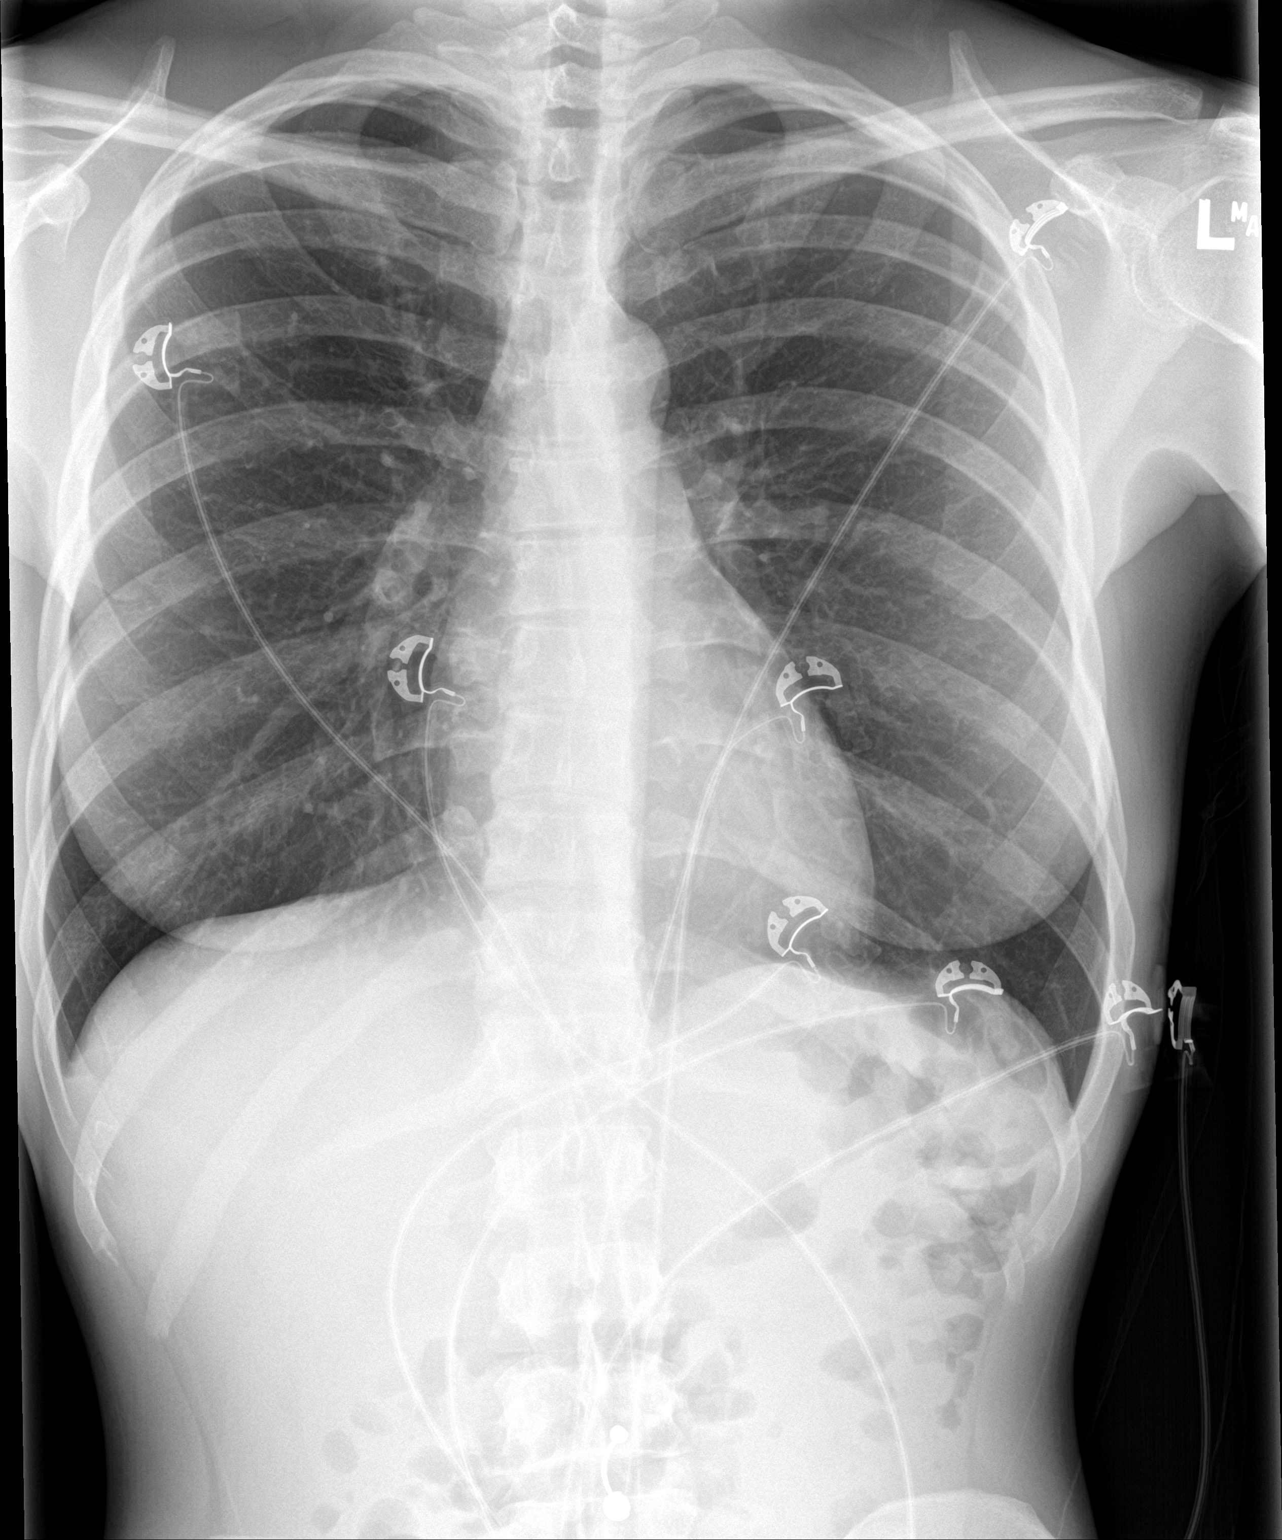

[chest lat]
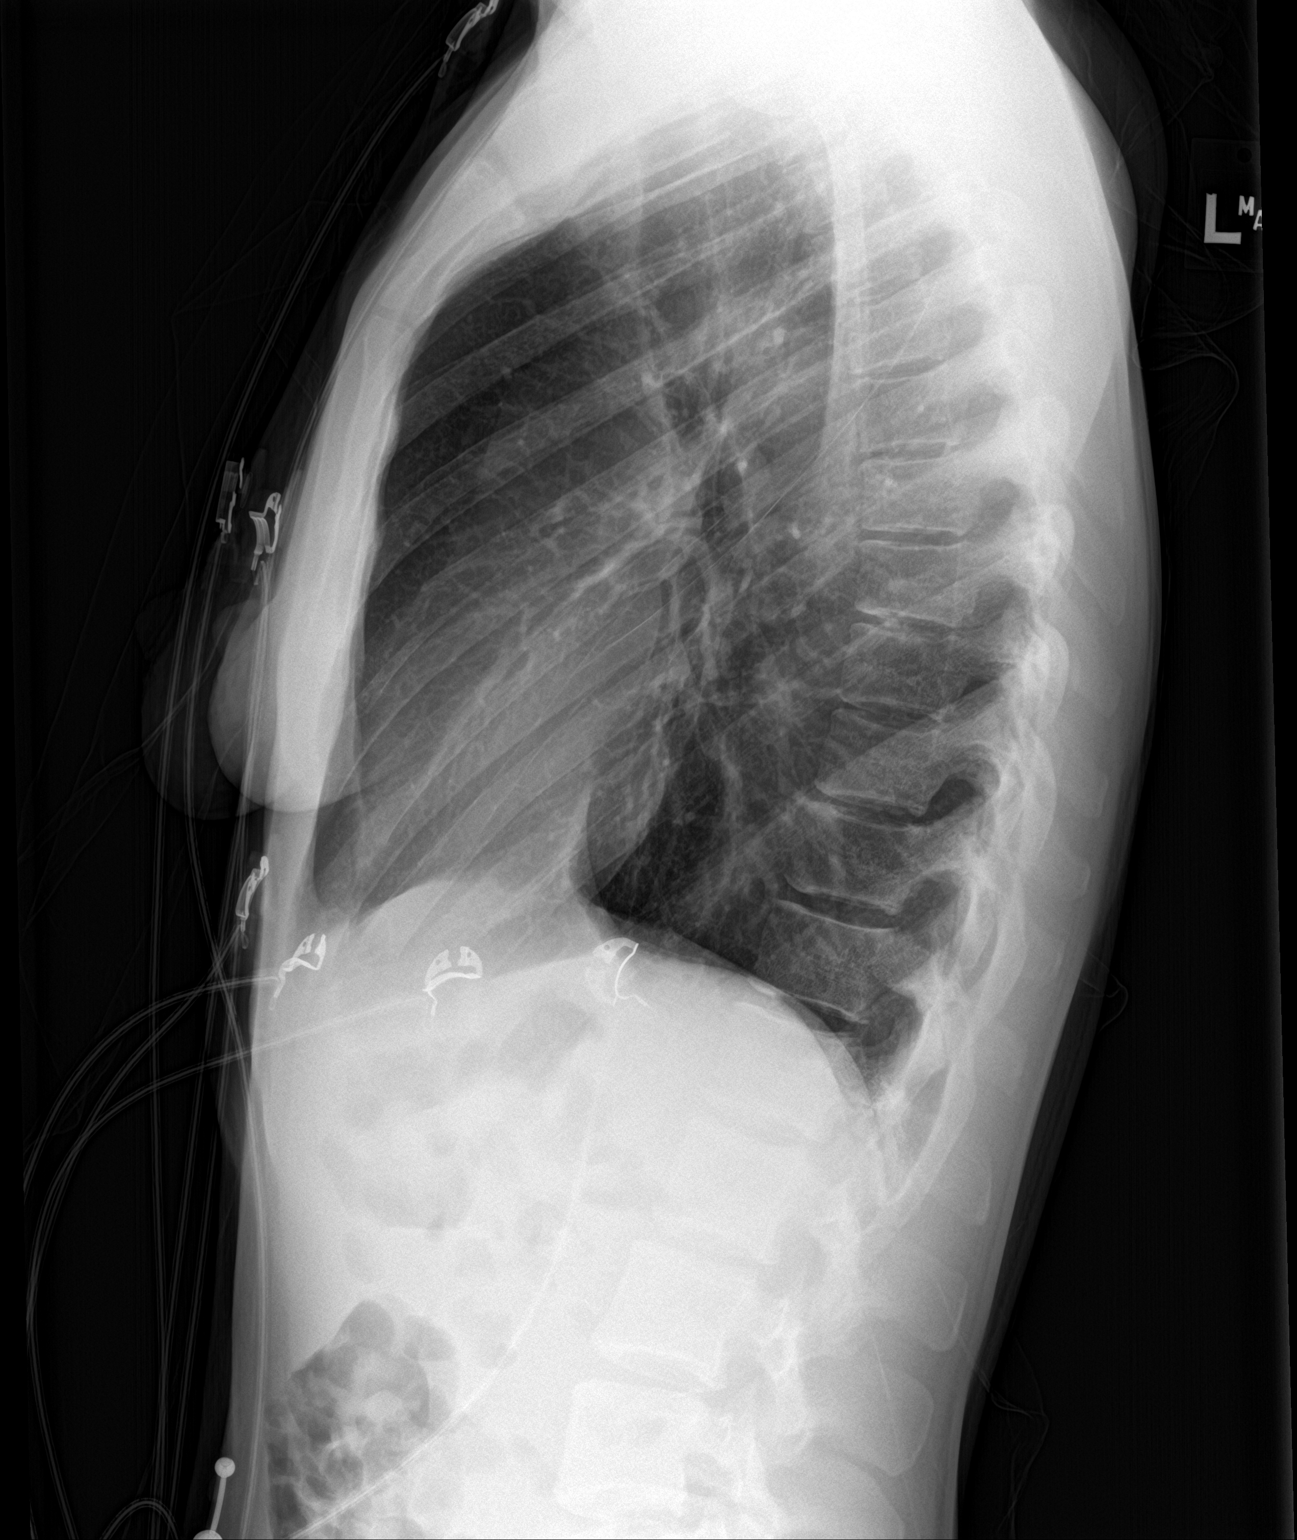

[2 of 2 positions shown; findings below may reference images not displayed]

FINDINGS: The cardiomediastinal contours are normal. The lungs are clear.
Pulmonary vasculature is normal. No consolidation, pleural effusion,
or pneumothorax. No acute osseous abnormalities are seen.
IMPRESSION: Negative radiographs of the chest.

## 2021-10-08 ENCOUNTER — Emergency Department (HOSPITAL_BASED_OUTPATIENT_CLINIC_OR_DEPARTMENT_OTHER): Payer: Medicaid Other

## 2021-10-08 ENCOUNTER — Other Ambulatory Visit: Payer: Self-pay

## 2021-10-08 ENCOUNTER — Emergency Department (HOSPITAL_BASED_OUTPATIENT_CLINIC_OR_DEPARTMENT_OTHER)
Admission: EM | Admit: 2021-10-08 | Discharge: 2021-10-09 | Disposition: A | Discharge status: Home / Self Care | Payer: Medicaid Other | Attending: Emergency Medicine | Admitting: Emergency Medicine

## 2021-10-08 ENCOUNTER — Encounter (HOSPITAL_BASED_OUTPATIENT_CLINIC_OR_DEPARTMENT_OTHER): Payer: Self-pay | Admitting: *Deleted

## 2021-10-08 DIAGNOSIS — R202 Paresthesia of skin: Secondary | ICD-10-CM | POA: Insufficient documentation

## 2021-10-08 DIAGNOSIS — R079 Chest pain, unspecified: Secondary | ICD-10-CM | POA: Insufficient documentation

## 2021-10-08 DIAGNOSIS — R42 Dizziness and giddiness: Secondary | ICD-10-CM | POA: Diagnosis not present

## 2021-10-08 DIAGNOSIS — R0602 Shortness of breath: Secondary | ICD-10-CM | POA: Insufficient documentation

## 2021-10-08 DIAGNOSIS — Z5321 Procedure and treatment not carried out due to patient leaving prior to being seen by health care provider: Secondary | ICD-10-CM | POA: Insufficient documentation

## 2021-10-08 HISTORY — DX: Disorder of thyroid, unspecified: E07.9

## 2021-10-08 LAB — CBC
HCT: 39.1 % (ref 36.0–46.0)
Hemoglobin: 13.4 g/dL (ref 12.0–15.0)
MCH: 30.8 pg (ref 26.0–34.0)
MCHC: 34.3 g/dL (ref 30.0–36.0)
MCV: 89.9 fL (ref 80.0–100.0)
Platelets: 362 10*3/uL (ref 150–400)
RBC: 4.35 MIL/uL (ref 3.87–5.11)
RDW: 11.7 % (ref 11.5–15.5)
WBC: 6.4 10*3/uL (ref 4.0–10.5)
nRBC: 0 % (ref 0.0–0.2)

## 2021-10-08 LAB — TROPONIN I (HIGH SENSITIVITY): Troponin I (High Sensitivity): <2 ng/L (ref <18)

## 2021-10-08 LAB — BASIC METABOLIC PANEL
Anion gap: 5 (ref 5–15)
BUN: 13 mg/dL (ref 6–20)
CO2: 26 mmol/L (ref 22–32)
Calcium: 8.9 mg/dL (ref 8.9–10.3)
Chloride: 104 mmol/L (ref 98–111)
Creatinine, Ser: 0.92 mg/dL (ref 0.44–1.00)
GFR, Estimated: >60 mL/min (ref >60)
Glucose, Bld: 100 mg/dL — ABNORMAL HIGH (ref 70–99)
Potassium: 4.3 mmol/L (ref 3.5–5.1)
Sodium: 135 mmol/L (ref 135–145)

## 2021-10-08 NOTE — ED Triage Notes (Signed)
Pt reports 3 days of chest pain, left arm "numb and tingling", SOB, lightheaded

## 2021-10-09 NOTE — ED Notes (Signed)
Pt not in lobby when called

## 2021-12-15 ENCOUNTER — Other Ambulatory Visit: Payer: Self-pay

## 2021-12-15 ENCOUNTER — Emergency Department (HOSPITAL_BASED_OUTPATIENT_CLINIC_OR_DEPARTMENT_OTHER)
Admission: EM | Admit: 2021-12-15 | Discharge: 2021-12-15 | Disposition: A | Discharge status: Home / Self Care | Payer: Medicaid Other | Attending: Emergency Medicine | Admitting: Emergency Medicine

## 2021-12-15 ENCOUNTER — Encounter (HOSPITAL_BASED_OUTPATIENT_CLINIC_OR_DEPARTMENT_OTHER): Payer: Self-pay | Admitting: Radiology

## 2021-12-15 DIAGNOSIS — J029 Acute pharyngitis, unspecified: Secondary | ICD-10-CM | POA: Diagnosis not present

## 2021-12-15 DIAGNOSIS — Z7952 Long term (current) use of systemic steroids: Secondary | ICD-10-CM | POA: Insufficient documentation

## 2021-12-15 DIAGNOSIS — Z20822 Contact with and (suspected) exposure to covid-19: Secondary | ICD-10-CM | POA: Insufficient documentation

## 2021-12-15 DIAGNOSIS — I10 Essential (primary) hypertension: Secondary | ICD-10-CM | POA: Insufficient documentation

## 2021-12-15 DIAGNOSIS — J4521 Mild intermittent asthma with (acute) exacerbation: Secondary | ICD-10-CM | POA: Insufficient documentation

## 2021-12-15 LAB — RESP PANEL BY RT-PCR (FLU A&B, COVID) ARPGX2
Influenza A by PCR: NEGATIVE
Influenza B by PCR: NEGATIVE
SARS Coronavirus 2 by RT PCR: NEGATIVE

## 2021-12-15 LAB — GROUP A STREP BY PCR: Group A Strep by PCR: NOT DETECTED

## 2021-12-15 MED ORDER — DEXAMETHASONE SODIUM PHOSPHATE 10 MG/ML IJ SOLN
12.0000 mg | Freq: Once | INTRAMUSCULAR | Status: AC
  Administered 2021-12-15: 23:00:00 12 mg via INTRAMUSCULAR
  Filled 2021-12-15: qty 2

## 2021-12-15 MED ORDER — LIDOCAINE VISCOUS HCL 2 % MT SOLN
15.0000 mL | Freq: Once | OROMUCOSAL | Status: AC
  Administered 2021-12-15: 23:00:00 15 mL via OROMUCOSAL
  Filled 2021-12-15: qty 15

## 2021-12-15 NOTE — ED Triage Notes (Signed)
Pt states that her throat started hurting yesterday and it hurts to swallow. Pt also believes that she has a sinus infection.

## 2021-12-15 NOTE — Discharge Instructions (Addendum)
Your COVID, flu and strep testing are all negative today.  Please follow-up with your primary care provider if your symptoms continue into next week.  Continue to take Mucinex for your congestion and cold medicine for your sore throat.  Information about pharyngitis is attached to these papers.  Please read these.  Return if you have worsening symptoms, difficulty breathing or fevers you are struggling to treat at home.

## 2021-12-15 NOTE — ED Provider Notes (Signed)
[facility] EMERGENCY DEPARTMENT Provider Note   CSN: [id number] Arrival date & time: 12/15/21  2216     History Chief Complaint  Patient presents with   Sore Throat   Nasal Congestion    Stacey Reid is a 25 y.o. female with a past medical history of asthma presenting today with complaint of congestion and sore throat for the past 2 days.  Reports her son and mother have similar symptoms.  Denies any fever or chills.  Endorsing large amounts of postnasal drip.  No ear discomfort.  Has been utilizing TheraFlu and Mucinex over-the-counter which are barely helping her.  Patient is worse with speaking and swallowing.  She reports she was seen for the same over the summer and is requesting similar treatment with lidocaine and steroid shot.   Past Medical History:  Diagnosis Date   Anemia affecting pregnancy 03/21/2017   Asthma    years ago, exercise induced   Headache    with pregnancy   Heart murmur    history of, resolved 3 months later   Hyperemesis 2016   Hypertension    Infection    UTI   Pregnancy induced hypertension    Syncope    in high school, found out had a heart murmur   Thyroid disease     Patient Active Problem List   Diagnosis Date Noted   Patellofemoral pain syndrome of both knees 10/27/2020   Post-operative state 07/16/2017   Gestational hypertension 06/25/2017   Sickle cell trait (HCC) 11/13/2016   Low vitamin D level 11/13/2016   Asthma exacerbation, mild 11/05/2016    Past Surgical History:  Procedure Laterality Date   LAPAROSCOPIC TUBAL LIGATION Bilateral 07/02/2017   Procedure: LAPAROSCOPIC TUBAL LIGATION - Filshie clips;  Surgeon: Hermina Staggers, MD;  Location: [facility];  Service: Gynecology;  Laterality: Bilateral;   WISDOM TOOTH EXTRACTION       OB History     Gravida  3   Para  2   Term  2   Preterm  0   AB  1   Living  2      SAB  1   IAB  0   Ectopic  0   Multiple  0   Live Births  2            Family History  Problem Relation Age of Onset   Asthma Mother    Hypertension Mother    Miscarriages / [place] Mother    Depression Mother    Lupus Mother    Healthy Father     Social History   Tobacco Use   Smoking status: Never   Smokeless tobacco: Never  Vaping Use   Vaping Use: Never used  Substance Use Topics   Alcohol use: Yes    Comment: occ.   Drug use: Not Currently    Types: Marijuana    Comment: quit 12/2019    Home Medications Prior to Admission medications   Medication Sig Start Date End Date Taking? Authorizing Provider  albuterol (VENTOLIN HFA) 108 (90 Base) MCG/ACT inhaler Inhale 1-2 puffs into the lungs every 6 (six) hours as needed for shortness of breath. 09/15/19   [provider]  diclofenac Sodium (VOLTAREN) 1 % GEL Apply 2 g topically 4 (four) times daily. 06/18/20   Caccavale, Sophia, PA-C  fluticasone (FLONASE) 50 MCG/ACT nasal spray Place 1 spray into both nostrils daily. 06/03/21   Petrucelli, Samantha R, PA-C  lidocaine (XYLOCAINE) 2 % solution  Use as directed 15 mLs in the mouth or throat every 4 (four) hours as needed for mouth pain. 06/03/21   Petrucelli, Samantha R, PA-C  naproxen (NAPROSYN) 500 MG tablet Take 1 tablet (500 mg total) by mouth 2 (two) times daily as needed for moderate pain. 06/03/21   Petrucelli, Pleas Koch, PA-C  metoCLOPramide (REGLAN) 10 MG tablet Take 1 tablet (10 mg total) by mouth every 6 (six) hours. Patient not taking: Reported on 03/16/2020 02/08/20 04/28/20  Anselm Pancoast, PA-C    Allergies    Patient has no known allergies.  Review of Systems   Review of Systems  Constitutional:  Negative for chills and fever.  HENT:  Positive for congestion and sore throat.    Physical Exam Updated Vital Signs BP (!) 136/93 (BP Location: Right Arm)    Pulse 91    Temp 98.7 F (37.1 C) (Oral)    Resp 17    Ht 5\' 2"  (1.575 m)    Wt 73.9 kg    SpO2 100%    BMI 29.81 kg/m   Physical Exam Vitals and nursing note  reviewed.  Constitutional:      Appearance: Normal appearance. She is not ill-appearing.  HENT:     Head: Normocephalic and atraumatic.     Nose: Congestion present.     Mouth/Throat:     Mouth: Mucous membranes are moist.     Pharynx: Oropharynx is clear. Posterior oropharyngeal erythema present. No oropharyngeal exudate.     Tonsils: 1+ on the right. 1+ on the left.  Eyes:     General: No scleral icterus.    Conjunctiva/sclera: Conjunctivae normal.  Pulmonary:     Effort: Pulmonary effort is normal. No respiratory distress.  Skin:    Findings: No rash.  Neurological:     Mental Status: She is alert.  Psychiatric:        Mood and Affect: Mood normal.    ED Results / Procedures / Treatments   Labs (all labs ordered are listed, but only abnormal results are displayed) Labs Reviewed  GROUP A STREP BY PCR  RESP PANEL BY RT-PCR (FLU A&B, COVID) ARPGX2    EKG None  Radiology No results found.  Procedures Procedures   Medications Ordered in ED Medications  lidocaine (XYLOCAINE) 2 % viscous mouth solution 15 mL (has no administration in time range)  dexamethasone (DECADRON) injection 12 mg (has no administration in time range)    ED Course  I have reviewed the triage vital signs and the nursing notes.  Pertinent labs & imaging results that were available during my care of the patient were reviewed by me and considered in my medical decision making (see chart for details).    MDM Rules/Calculators/A&P Patient is well-appearing.  No sign of PTA or retropharyngeal abscess.  No obvious exudate.  Erythema and tonsillar swelling.  Airway clear, tolerating secretions.  Strep throat is negative.  I suspect her throat discomfort to be a result of postnasal drip secondary to a viral illness that is not flu or coronavirus.  Both of these are negative today.  Decadron shot and Xylocaine mouthwash given per patient request.  Information about pharyngitis also is attached to her  discharge papers.  Work note supplied.  Agreeable to discharge at this time.  Final Clinical Impression(s) / ED Diagnoses Final diagnoses:  Pharyngitis, unspecified etiology    Rx / DC Orders Results and diagnoses were explained to the patient. Return precautions discussed in full. Patient had no  additional questions and expressed complete understanding.     Woodroe Chen 12/15/21 2328    Glynn Octave, MD 12/16/21 [phone]

## 2021-12-19 ENCOUNTER — Other Ambulatory Visit: Payer: Self-pay

## 2021-12-19 ENCOUNTER — Encounter (HOSPITAL_COMMUNITY): Payer: Self-pay | Admitting: Emergency Medicine

## 2021-12-19 ENCOUNTER — Ambulatory Visit (HOSPITAL_COMMUNITY)
Admission: EM | Admit: 2021-12-19 | Discharge: 2021-12-19 | Disposition: A | Discharge status: Home / Self Care | Payer: Medicaid Other

## 2021-12-19 ENCOUNTER — Emergency Department (HOSPITAL_COMMUNITY)
Admission: EM | Admit: 2021-12-19 | Discharge: 2021-12-20 | Disposition: A | Discharge status: Home / Self Care | Payer: Medicaid Other | Attending: Emergency Medicine | Admitting: Emergency Medicine

## 2021-12-19 ENCOUNTER — Emergency Department (HOSPITAL_COMMUNITY): Payer: Medicaid Other

## 2021-12-19 DIAGNOSIS — N9489 Other specified conditions associated with female genital organs and menstrual cycle: Secondary | ICD-10-CM | POA: Diagnosis not present

## 2021-12-19 DIAGNOSIS — Z20822 Contact with and (suspected) exposure to covid-19: Secondary | ICD-10-CM | POA: Diagnosis not present

## 2021-12-19 DIAGNOSIS — J069 Acute upper respiratory infection, unspecified: Secondary | ICD-10-CM | POA: Diagnosis not present

## 2021-12-19 DIAGNOSIS — R059 Cough, unspecified: Secondary | ICD-10-CM | POA: Diagnosis present

## 2021-12-19 LAB — TROPONIN I (HIGH SENSITIVITY): Troponin I (High Sensitivity): <2 ng/L (ref <18)

## 2021-12-19 LAB — BASIC METABOLIC PANEL
Anion gap: 4 — ABNORMAL LOW (ref 5–15)
BUN: 10 mg/dL (ref 6–20)
CO2: 27 mmol/L (ref 22–32)
Calcium: 8.8 mg/dL — ABNORMAL LOW (ref 8.9–10.3)
Chloride: 105 mmol/L (ref 98–111)
Creatinine, Ser: 0.84 mg/dL (ref 0.44–1.00)
GFR, Estimated: >60 mL/min (ref >60)
Glucose, Bld: 98 mg/dL (ref 70–99)
Potassium: 4.1 mmol/L (ref 3.5–5.1)
Sodium: 136 mmol/L (ref 135–145)

## 2021-12-19 LAB — CBC
HCT: 38.9 % (ref 36.0–46.0)
Hemoglobin: 13.4 g/dL (ref 12.0–15.0)
MCH: 31.5 pg (ref 26.0–34.0)
MCHC: 34.4 g/dL (ref 30.0–36.0)
MCV: 91.3 fL (ref 80.0–100.0)
Platelets: 366 10*3/uL (ref 150–400)
RBC: 4.26 MIL/uL (ref 3.87–5.11)
RDW: 11.3 % — ABNORMAL LOW (ref 11.5–15.5)
WBC: 9.2 10*3/uL (ref 4.0–10.5)
nRBC: 0 % (ref 0.0–0.2)

## 2021-12-19 LAB — I-STAT BETA HCG BLOOD, ED (MC, WL, AP ONLY): I-stat hCG, quantitative: <5 m[IU]/mL (ref <5)

## 2021-12-19 LAB — RESP PANEL BY RT-PCR (FLU A&B, COVID) ARPGX2
Influenza A by PCR: NEGATIVE
Influenza B by PCR: NEGATIVE
SARS Coronavirus 2 by RT PCR: NEGATIVE

## 2021-12-19 LAB — GROUP A STREP BY PCR: Group A Strep by PCR: NOT DETECTED

## 2021-12-19 MED ORDER — ACETAMINOPHEN 325 MG PO TABS
650.0000 mg | ORAL_TABLET | Freq: Once | ORAL | Status: AC | PRN
  Administered 2021-12-20: 02:00:00 650 mg via ORAL
  Filled 2021-12-19: qty 2

## 2021-12-19 NOTE — ED Triage Notes (Signed)
Patient complaining of sore throat, mid chest pain, productive cough, and pressure in her face. She states this started yesterday.

## 2021-12-20 MED ORDER — BENZONATATE 100 MG PO CAPS: 100.0000 mg | ORAL_CAPSULE | Freq: Three times a day (TID) | ORAL | 0 refills | Status: DC

## 2021-12-20 MED ORDER — AZITHROMYCIN 250 MG PO TABS: 250.0000 mg | ORAL_TABLET | Freq: Every day | ORAL | 0 refills | Status: DC

## 2021-12-20 NOTE — ED Provider Notes (Signed)
[facility]EMERGENCY DEPT Provider Note   CSN: [id number] Arrival date & time: 12/19/21  2045     History Chief Complaint  Patient presents with   Chest Pain   Nasal Congestion    Stacey Reid is a 25 y.o. female.  Patient presents to the emergency department with a chief complaint of cough, sore throat, nasal congestion and chest congestion.  She states that her symptoms started 5 days ago.  She has been trying OTC medications.  She states that she also got a steroid shot at an urgent care last week.  She denies any fevers.  Denies any successful treatments prior to arrival.  The history is provided by the patient. No language interpreter was used.      Past Medical History:  Diagnosis Date   Anemia affecting pregnancy 03/21/2017   Asthma    years ago, exercise induced   Headache    with pregnancy   Heart murmur    history of, resolved 3 months later   Hyperemesis 2016   Hypertension    Infection    UTI   Pregnancy induced hypertension    Syncope    in high school, found out had a heart murmur   Thyroid disease     Patient Active Problem List   Diagnosis Date Noted   Patellofemoral pain syndrome of both knees 10/27/2020   Post-operative state 07/16/2017   Gestational hypertension 06/25/2017   Sickle cell trait (HCC) 11/13/2016   Low vitamin D level 11/13/2016   Asthma exacerbation, mild 11/05/2016    Past Surgical History:  Procedure Laterality Date   LAPAROSCOPIC TUBAL LIGATION Bilateral 07/02/2017   Procedure: LAPAROSCOPIC TUBAL LIGATION - Filshie clips;  Surgeon: Hermina Staggers, MD;  Location: [facility];  Service: Gynecology;  Laterality: Bilateral;   WISDOM TOOTH EXTRACTION       OB History     Gravida  3   Para  2   Term  2   Preterm  0   AB  1   Living  2      SAB  1   IAB  0   Ectopic  0   Multiple  0   Live Births  2           Family History  Problem Relation Age of Onset   Asthma Mother     Hypertension Mother    Miscarriages / [place] Mother    Depression Mother    Lupus Mother    Healthy Father     Social History   Tobacco Use   Smoking status: Never   Smokeless tobacco: Never  Vaping Use   Vaping Use: Never used  Substance Use Topics   Alcohol use: Yes    Comment: occ.   Drug use: Not Currently    Types: Marijuana    Comment: quit 12/2019    Home Medications Prior to Admission medications   Medication Sig Start Date End Date Taking? Authorizing Provider  azithromycin (ZITHROMAX) 250 MG tablet Take 1 tablet (250 mg total) by mouth daily. Take first 2 tablets together, then 1 every day until finished. 12/20/21  Yes Roxy Horseman, PA-C  benzonatate (TESSALON) 100 MG capsule Take 1 capsule (100 mg total) by mouth every 8 (eight) hours. 12/20/21  Yes Roxy Horseman, PA-C  albuterol (VENTOLIN HFA) 108 (90 Base) MCG/ACT inhaler Inhale 1-2 puffs into the lungs every 6 (six) hours as needed for shortness of breath. 09/15/19   [provider]  diclofenac Sodium (VOLTAREN) 1 % GEL Apply 2 g topically 4 (four) times daily. 06/18/20   Caccavale, Sophia, PA-C  fluticasone (FLONASE) 50 MCG/ACT nasal spray Place 1 spray into both nostrils daily. 06/03/21   Petrucelli, Samantha R, PA-C  lidocaine (XYLOCAINE) 2 % solution Use as directed 15 mLs in the mouth or throat every 4 (four) hours as needed for mouth pain. 06/03/21   Petrucelli, Samantha R, PA-C  naproxen (NAPROSYN) 500 MG tablet Take 1 tablet (500 mg total) by mouth 2 (two) times daily as needed for moderate pain. 06/03/21   Petrucelli, Pleas Koch, PA-C  metoCLOPramide (REGLAN) 10 MG tablet Take 1 tablet (10 mg total) by mouth every 6 (six) hours. Patient not taking: Reported on 03/16/2020 02/08/20 04/28/20  Anselm Pancoast, PA-C    Allergies    Patient has no known allergies.  Review of Systems   Review of Systems  All other systems reviewed and are negative.  Physical Exam Updated Vital Signs BP 124/81  (BP Location: Right Arm)    Pulse 87    Temp 98.5 F (36.9 C) (Oral)    Resp 16    Ht 5\' 2"  (1.575 m)    Wt 73.9 kg    LMP 12/11/2021    SpO2 100%    BMI 29.81 kg/m   Physical Exam Vitals and nursing note reviewed.  Constitutional:      General: She is not in acute distress.    Appearance: She is well-developed.  HENT:     Head: Normocephalic and atraumatic.     Mouth/Throat:     Mouth: Mucous membranes are moist.     Pharynx: Posterior oropharyngeal erythema present. No oropharyngeal exudate.  Eyes:     Conjunctiva/sclera: Conjunctivae normal.  Cardiovascular:     Rate and Rhythm: Normal rate and regular rhythm.     Heart sounds: No murmur heard. Pulmonary:     Effort: Pulmonary effort is normal. No respiratory distress.     Breath sounds: Normal breath sounds.  Abdominal:     Palpations: Abdomen is soft.     Tenderness: There is no abdominal tenderness.  Musculoskeletal:        General: No swelling.     Cervical back: Neck supple.  Skin:    General: Skin is warm and dry.     Capillary Refill: Capillary refill takes less than 2 seconds.  Neurological:     Mental Status: She is alert.  Psychiatric:        Mood and Affect: Mood normal.    ED Results / Procedures / Treatments   Labs (all labs ordered are listed, but only abnormal results are displayed) Labs Reviewed  BASIC METABOLIC PANEL - Abnormal; Notable for the following components:      Result Value   Calcium 8.8 (*)    Anion gap 4 (*)    All other components within normal limits  CBC - Abnormal; Notable for the following components:   RDW 11.3 (*)    All other components within normal limits  RESP PANEL BY RT-PCR (FLU A&B, COVID) ARPGX2  GROUP A STREP BY PCR  I-STAT BETA HCG BLOOD, ED ([facility], [facility], [facility] ONLY)  TROPONIN I (HIGH SENSITIVITY)  TROPONIN I (HIGH SENSITIVITY)    EKG None  Radiology DG Chest 2 View  Result Date: 12/19/2021 CLINICAL DATA:  Chest tightness and congestion, sore throat and cough for  1 week EXAM: CHEST - 2 VIEW COMPARISON:  10/08/2021 FINDINGS: Frontal and lateral views of  the chest demonstrate an unremarkable cardiac silhouette. No acute airspace disease, effusion, or pneumothorax. No acute bony abnormalities. IMPRESSION: 1. No acute intrathoracic process. Electronically Signed   By: Sharlet Salina M.D.   On: 12/19/2021 22:21    Procedures Procedures   Medications Ordered in ED Medications  acetaminophen (TYLENOL) tablet 650 mg (has no administration in time range)    ED Course  I have reviewed the triage vital signs and the nursing notes.  Pertinent labs & imaging results that were available during my care of the patient were reviewed by me and considered in my medical decision making (see chart for details).    MDM Rules/Calculators/A&P                         Patient here with cough, sore throat, and congestion.  COVID, flu, and strep tests are negative.  Chest x-ray is negative.  I encouraged patient to trial supportive care for another couple of days.  If that fails, she can begin taking azithromycin.  Patient agrees with this plan.  She is stable ready for discharge.    Final Clinical Impression(s) / ED Diagnoses Final diagnoses:  Upper respiratory tract infection, unspecified type    Rx / DC Orders ED Discharge Orders          Ordered    azithromycin (ZITHROMAX) 250 MG tablet  Daily        12/20/21 0156    benzonatate (TESSALON) 100 MG capsule  Every 8 hours        12/20/21 0156             Roxy Horseman, PA-C 12/20/21 0159    Sabas Sous, MD 12/20/21 [phone]

## 2022-01-08 ENCOUNTER — Ambulatory Visit (INDEPENDENT_AMBULATORY_CARE_PROVIDER_SITE_OTHER): Payer: Medicaid Other | Admitting: Family Medicine

## 2022-01-08 ENCOUNTER — Other Ambulatory Visit (HOSPITAL_BASED_OUTPATIENT_CLINIC_OR_DEPARTMENT_OTHER): Payer: Self-pay

## 2022-01-08 ENCOUNTER — Ambulatory Visit: Payer: Self-pay

## 2022-01-08 VITALS — BP 110/80 | Ht 62.0 in | Wt 160.0 lb

## 2022-01-08 DIAGNOSIS — M7042 Prepatellar bursitis, left knee: Secondary | ICD-10-CM | POA: Diagnosis present

## 2022-01-08 DIAGNOSIS — M25562 Pain in left knee: Secondary | ICD-10-CM

## 2022-01-08 MED ORDER — PREDNISONE 5 MG PO TABS
ORAL_TABLET | ORAL | 0 refills | Status: DC
  Filled 2022-01-08: qty 21, 6d supply, fill #0

## 2022-01-08 NOTE — Patient Instructions (Signed)
Good to see you Please use ice  Please try to avoid hitting or resting on the knee   Please send me a message in MyChart with any questions or updates.  Please see me back in 4 weeks or as needed if better.   --Dr. Jordan Likes

## 2022-01-08 NOTE — Progress Notes (Signed)
°  Stacey Reid - 26 y.o. female MRN [id number]  Date of birth: May 28, 1996  SUBJECTIVE:  Including CC & ROS.  No chief complaint on file.   Stacey Reid is a 26 y.o. female that is presenting with acute left knee pain.  She had a fall onto her knee about a week ago.  Having pain with extension and palpation.    Review of Systems See HPI   HISTORY: Past Medical, Surgical, Social, and Family History Reviewed & Updated per EMR.   Pertinent Historical Findings include:  Past Medical History:  Diagnosis Date   Anemia affecting pregnancy 03/21/2017   Asthma    years ago, exercise induced   Headache    with pregnancy   Heart murmur    history of, resolved 3 months later   Hyperemesis 2016   Hypertension    Infection    UTI   Pregnancy induced hypertension    Syncope    in high school, found out had a heart murmur   Thyroid disease     Past Surgical History:  Procedure Laterality Date   LAPAROSCOPIC TUBAL LIGATION Bilateral 07/02/2017   Procedure: LAPAROSCOPIC TUBAL LIGATION - Filshie clips;  Surgeon: Hermina Staggers, MD;  Location: [facility];  Service: Gynecology;  Laterality: Bilateral;   WISDOM TOOTH EXTRACTION       PHYSICAL EXAM:  VS: BP 110/80    Ht 5\' 2"  (1.575 m)    Wt 160 lb (72.6 kg)    LMP 12/11/2021    BMI 29.26 kg/m  Physical Exam Gen: NAD, alert, cooperative with exam, well-appearing MSK:  Neurovascularly intact    Limited ultrasound: Left knee:  No effusion suprapatellar pouch. Normal-appearing quadricep tendon. Some bogginess and thickening of the prepatellar bursa. Normal-appearing patellar tendon. Normal-appearing medial lateral joint space  Summary: Prepatellar bursitis  Ultrasound and interpretation by 12/13/2021, MD    ASSESSMENT & PLAN:   Prepatellar bursitis of left knee Had a trauma and landed onto the kneecap.  Has bogginess of the bursa appreciated on exam. -Counseled on home exercise therapy and supportive  care. -Prednisone. -Could consider physical therapy.

## 2022-01-08 NOTE — Assessment & Plan Note (Signed)
Had a trauma and landed onto the kneecap.  Has bogginess of the bursa appreciated on exam. -Counseled on home exercise therapy and supportive care. -Prednisone. -Could consider physical therapy.

## 2022-02-08 ENCOUNTER — Ambulatory Visit: Payer: Medicaid Other | Admitting: Family Medicine

## 2022-05-02 ENCOUNTER — Encounter (HOSPITAL_BASED_OUTPATIENT_CLINIC_OR_DEPARTMENT_OTHER): Payer: Self-pay | Admitting: Emergency Medicine

## 2022-05-02 ENCOUNTER — Emergency Department (HOSPITAL_BASED_OUTPATIENT_CLINIC_OR_DEPARTMENT_OTHER): Payer: Medicaid Other

## 2022-05-02 ENCOUNTER — Emergency Department (HOSPITAL_BASED_OUTPATIENT_CLINIC_OR_DEPARTMENT_OTHER)
Admission: EM | Admit: 2022-05-02 | Discharge: 2022-05-02 | Disposition: A | Discharge status: Home / Self Care | Payer: Medicaid Other | Attending: Emergency Medicine | Admitting: Emergency Medicine

## 2022-05-02 ENCOUNTER — Other Ambulatory Visit: Payer: Self-pay

## 2022-05-02 DIAGNOSIS — R102 Pelvic and perineal pain: Secondary | ICD-10-CM | POA: Diagnosis not present

## 2022-05-02 DIAGNOSIS — R1031 Right lower quadrant pain: Secondary | ICD-10-CM | POA: Insufficient documentation

## 2022-05-02 DIAGNOSIS — Z79899 Other long term (current) drug therapy: Secondary | ICD-10-CM | POA: Diagnosis not present

## 2022-05-02 LAB — URINALYSIS, ROUTINE W REFLEX MICROSCOPIC
Bilirubin Urine: NEGATIVE
Glucose, UA: NEGATIVE mg/dL
Hgb urine dipstick: NEGATIVE
Ketones, ur: NEGATIVE mg/dL
Leukocytes,Ua: NEGATIVE
Nitrite: NEGATIVE
Protein, ur: NEGATIVE mg/dL
Specific Gravity, Urine: 1.016 (ref 1.005–1.030)
pH: 6.5 (ref 5.0–8.0)

## 2022-05-02 LAB — PREGNANCY, URINE: Preg Test, Ur: NEGATIVE

## 2022-05-02 NOTE — ED Triage Notes (Signed)
Lower abdominal pain  dull sharp for about 2 months, her cycle came last month and was very heavy and painful. Pt has had cyst on ovary 8 years ago and feels similar.  ?

## 2022-05-02 NOTE — ED Provider Notes (Signed)
?[facility] EMERGENCY DEPT ?Provider Note ? ? ?CSN: [id number] ?Arrival date & time: 05/02/22  1524 ? ?  ? ?History ? ?Chief Complaint  ?Patient presents with  ? Pelvic Pain  ? ? ?Stacey Reid is a 26 y.o. female. ? ?HPI ?26 year old female presents with concern for ovarian cyst.  She reports she had one about 8 years ago and this feels similar.  For the past couple months she has been having right lower quadrant pain that at times is severe.  During her last menstrual cycle last month the pain was uncontrolled with Midol and Tylenol.  Since then its remained a dull pain in her right lower abdomen.  Some pain in the right lower back as well.  No urinary or vaginal symptoms or concern for STI.  No vomiting though at times the pain makes her nauseous.  No diarrhea.  Currently rates the pain as a 5 out of 10. Previous history of tubal ligation. ? ?Home Medications ?Prior to Admission medications   ?Medication Sig Start Date End Date Taking? Authorizing Provider  ?albuterol (VENTOLIN HFA) 108 (90 Base) MCG/ACT inhaler Inhale 1-2 puffs into the lungs every 6 (six) hours as needed for shortness of breath. 09/15/19   [provider]  ?azithromycin (ZITHROMAX) 250 MG tablet Take 1 tablet (250 mg total) by mouth daily. Take first 2 tablets together, then 1 every day until finished. 12/20/21   Roxy Horseman, PA-C  ?benzonatate (TESSALON) 100 MG capsule Take 1 capsule (100 mg total) by mouth every 8 (eight) hours. 12/20/21   Roxy Horseman, PA-C  ?diclofenac Sodium (VOLTAREN) 1 % GEL Apply 2 g topically 4 (four) times daily. 06/18/20   Caccavale, Sophia, PA-C  ?fluticasone (FLONASE) 50 MCG/ACT nasal spray Place 1 spray into both nostrils daily. 06/03/21   Petrucelli, Samantha R, PA-C  ?lidocaine (XYLOCAINE) 2 % solution Use as directed 15 mLs in the mouth or throat every 4 (four) hours as needed for mouth pain. 06/03/21   Petrucelli, Samantha R, PA-C  ?naproxen (NAPROSYN) 500 MG tablet Take 1  tablet (500 mg total) by mouth 2 (two) times daily as needed for moderate pain. 06/03/21   Petrucelli, Lelon Mast R, PA-C  ?predniSONE (DELTASONE) 5 MG tablet Take 6 pills for first day, 5 pills second day, 4 pills third day, 3 pills fourth day, 2 pills the fifth day, and 1 pill sixth day. 01/08/22   Myra Rude, MD  ?metoCLOPramide (REGLAN) 10 MG tablet Take 1 tablet (10 mg total) by mouth every 6 (six) hours. ?Patient not taking: Reported on 03/16/2020 02/08/20 04/28/20  Anselm Pancoast, PA-C  ?   ? ?Allergies    ?Patient has no known allergies.   ? ?Review of Systems   ?Review of Systems  ?Constitutional:  Negative for fever.  ?Gastrointestinal:  Positive for abdominal pain and nausea. Negative for diarrhea and vomiting.  ?Genitourinary:  Negative for dysuria, hematuria, vaginal bleeding and vaginal discharge.  ?Musculoskeletal:  Positive for back pain.  ? ?Physical Exam ?Updated Vital Signs ?BP 116/87   Pulse 78   Temp 98.2 ?F (36.8 ?C)   Resp 16   SpO2 99%  ?Physical Exam ?Vitals and nursing note reviewed.  ?Constitutional:   ?   Appearance: She is well-developed.  ?HENT:  ?   Head: Normocephalic and atraumatic.  ?Pulmonary:  ?   Effort: Pulmonary effort is normal.  ?Abdominal:  ?   General: There is no distension.  ?   Palpations: Abdomen is soft.  ?  Tenderness: There is no abdominal tenderness.  ?Skin: ?   General: Skin is warm and dry.  ?Neurological:  ?   Mental Status: She is alert.  ? ? ?ED Results / Procedures / Treatments   ?Labs ?(all labs ordered are listed, but only abnormal results are displayed) ?Labs Reviewed  ?URINALYSIS, ROUTINE W REFLEX MICROSCOPIC  ?PREGNANCY, URINE  ? ? ?EKG ?None ? ?Radiology ?US PELVIS (TRANSABDOMINAL ONLY) ? ?Result Date: 05/02/2022 ?CLINICAL DATA:  Pelvic pain for 2 months. EXAM: TRANSABDOMINAL ULTRASOUND OF PELVIS DOPPLER ULTRASOUND OF OVARIES TECHNIQUE: Transabdominal ultrasound examination of the pelvis was performed including evaluation of the uterus, ovaries, adnexal  regions, and pelvic cul-de-sac. Color and duplex Doppler ultrasound was utilized to evaluate blood flow to the ovaries. COMPARISON:  None Available. FINDINGS: Uterus Measurements: 9.2 x 4.4 x 5.9 cm = volume: 124 mL. Uterus is anteverted. No fibroids or other mass visualized. Endometrium Thickness: 7.3 mm, normal.  No focal abnormality visualized. Right ovary Measurements: 3.6 x 1.9 x 2.4 cm = volume: 8.8 mL. Normal appearance/no adnexal mass. Left ovary Measurements: 3.3 x 2.1 x 3.3 cm = volume: 7.3 mL. Normal appearance/no adnexal mass. Pulsed Doppler evaluation demonstrates normal low-resistance arterial and venous waveforms in both ovaries. Other: No pelvic free fluid. IMPRESSION: Normal pelvic ultrasound. Normal blood flow to both ovaries without torsion. Electronically Signed   By: Narda Rutherford M.D.   On: 05/02/2022 17:08  ? ?US PELVIC DOPPLER (TORSION R/O OR MASS ARTERIAL FLOW) ? ?Result Date: 05/02/2022 ?CLINICAL DATA:  Pelvic pain for 2 months. EXAM: TRANSABDOMINAL ULTRASOUND OF PELVIS DOPPLER ULTRASOUND OF OVARIES TECHNIQUE: Transabdominal ultrasound examination of the pelvis was performed including evaluation of the uterus, ovaries, adnexal regions, and pelvic cul-de-sac. Color and duplex Doppler ultrasound was utilized to evaluate blood flow to the ovaries. COMPARISON:  None Available. FINDINGS: Uterus Measurements: 9.2 x 4.4 x 5.9 cm = volume: 124 mL. Uterus is anteverted. No fibroids or other mass visualized. Endometrium Thickness: 7.3 mm, normal.  No focal abnormality visualized. Right ovary Measurements: 3.6 x 1.9 x 2.4 cm = volume: 8.8 mL. Normal appearance/no adnexal mass. Left ovary Measurements: 3.3 x 2.1 x 3.3 cm = volume: 7.3 mL. Normal appearance/no adnexal mass. Pulsed Doppler evaluation demonstrates normal low-resistance arterial and venous waveforms in both ovaries. Other: No pelvic free fluid. IMPRESSION: Normal pelvic ultrasound. Normal blood flow to both ovaries without torsion.  Electronically Signed   By: Narda Rutherford M.D.   On: 05/02/2022 17:08   ? ?Procedures ?Procedures  ? ? ?Medications Ordered in ED ?Medications - No data to display ? ?ED Course/ Medical Decision Making/ A&P ?  ?                        ?Medical Decision Making ?Amount and/or Complexity of Data Reviewed ?External Data Reviewed: notes. ?Labs: ordered. ?Radiology: ordered and independent interpretation performed. ? ? ?Pregnancy test is negative as well as urinalysis.  No abdominal tenderness on exam.  My suspicion for serious intra-abdominal emergency is pretty low.  However given her prolonged right lower quadrant pain, ultrasound was obtained.  I personally reviewed these images and no obvious cyst and certainly no evidence of torsion.  Did consider we could do a CT scan but my suspicion of something like an appendicitis or complication from appendicitis given the 24-month plus timeframe is pretty low.  Vitals are normal here.  We did discuss she may need further imaging if symptoms do not improve.  However  she wants to go and I think it is reasonable to defer imaging now but consider it later if symptoms do not improve.  Given return precautions. ? ? ? ? ? ? ? ?Final Clinical Impression(s) / ED Diagnoses ?Final diagnoses:  ?Right lower quadrant pain  ? ? ?Rx / DC Orders ?ED Discharge Orders   ? ? None  ? ?  ? ? ?  ?Pricilla LovelessGoldston, Karina Nofsinger, MD ?05/02/22 1720 ? ?

## 2022-05-02 NOTE — Discharge Instructions (Addendum)
If you develop worsening, continued, or recurrent abdominal pain, uncontrolled vomiting, fever, chest or back pain, or any other new/concerning symptoms then return to the ER for evaluation.  

## 2022-05-02 NOTE — ED Notes (Signed)
Patient came to this RN and stated she needed to leave as her child had become ill. MD Criss Alvine made aware and came to bedside to speak to patient ?

## 2022-05-21 ENCOUNTER — Other Ambulatory Visit: Payer: Self-pay

## 2022-05-21 ENCOUNTER — Encounter (HOSPITAL_BASED_OUTPATIENT_CLINIC_OR_DEPARTMENT_OTHER): Payer: Self-pay | Admitting: Emergency Medicine

## 2022-05-21 ENCOUNTER — Emergency Department (HOSPITAL_BASED_OUTPATIENT_CLINIC_OR_DEPARTMENT_OTHER): Payer: Medicaid Other | Admitting: Radiology

## 2022-05-21 ENCOUNTER — Emergency Department (HOSPITAL_BASED_OUTPATIENT_CLINIC_OR_DEPARTMENT_OTHER)
Admission: EM | Admit: 2022-05-21 | Discharge: 2022-05-21 | Discharge status: Against Medical Advice | Payer: Medicaid Other | Attending: Emergency Medicine | Admitting: Emergency Medicine

## 2022-05-21 DIAGNOSIS — Z5321 Procedure and treatment not carried out due to patient leaving prior to being seen by health care provider: Secondary | ICD-10-CM | POA: Diagnosis not present

## 2022-05-21 DIAGNOSIS — R079 Chest pain, unspecified: Secondary | ICD-10-CM | POA: Diagnosis present

## 2022-05-21 DIAGNOSIS — R519 Headache, unspecified: Secondary | ICD-10-CM | POA: Diagnosis not present

## 2022-05-21 LAB — BASIC METABOLIC PANEL
Anion gap: 7 (ref 5–15)
BUN: 11 mg/dL (ref 6–20)
CO2: 27 mmol/L (ref 22–32)
Calcium: 9.1 mg/dL (ref 8.9–10.3)
Chloride: 106 mmol/L (ref 98–111)
Creatinine, Ser: 0.74 mg/dL (ref 0.44–1.00)
GFR, Estimated: >60 mL/min (ref >60)
Glucose, Bld: 96 mg/dL (ref 70–99)
Potassium: 3.9 mmol/L (ref 3.5–5.1)
Sodium: 140 mmol/L (ref 135–145)

## 2022-05-21 LAB — CBC
HCT: 38 % (ref 36.0–46.0)
Hemoglobin: 13 g/dL (ref 12.0–15.0)
MCH: 30.5 pg (ref 26.0–34.0)
MCHC: 34.2 g/dL (ref 30.0–36.0)
MCV: 89.2 fL (ref 80.0–100.0)
Platelets: 372 10*3/uL (ref 150–400)
RBC: 4.26 MIL/uL (ref 3.87–5.11)
RDW: 11.4 % — ABNORMAL LOW (ref 11.5–15.5)
WBC: 5.5 10*3/uL (ref 4.0–10.5)
nRBC: 0 % (ref 0.0–0.2)

## 2022-05-21 LAB — PREGNANCY, URINE: Preg Test, Ur: NEGATIVE

## 2022-05-21 LAB — TROPONIN I (HIGH SENSITIVITY): Troponin I (High Sensitivity): <2 ng/L (ref <18)

## 2022-05-21 NOTE — ED Triage Notes (Signed)
Pt complains of left sided chest pain, left shoulder pain, jaw pain x 4 days. Also complains of migraine headaches for 4 days. Denies any n/v/d

## 2022-05-21 NOTE — ED Provider Notes (Signed)
Pt was triaged, labs were obtained, notified by RN that patient left prior to being evaluated by myself at 8:39 PM.  Patient was not evaluated by myself prior to leaving AMA.    Cayli Escajeda A, PA-C 05/22/22 0002    Franne Forts, DO 05/22/22 0012

## 2022-05-21 NOTE — ED Notes (Addendum)
Pt exited room stating she has been waiting too long it's late and she needs to take something for her headache. This nurse encouraged pt to stay as provider was on the way to see the pt however pt refused at this time. A&Ox3 and walking with steady gait at time of departure.

## 2022-12-18 ENCOUNTER — Emergency Department (HOSPITAL_BASED_OUTPATIENT_CLINIC_OR_DEPARTMENT_OTHER): Payer: Medicaid Other

## 2022-12-18 ENCOUNTER — Encounter (HOSPITAL_BASED_OUTPATIENT_CLINIC_OR_DEPARTMENT_OTHER): Payer: Self-pay

## 2022-12-18 ENCOUNTER — Other Ambulatory Visit: Payer: Self-pay

## 2022-12-18 DIAGNOSIS — J45909 Unspecified asthma, uncomplicated: Secondary | ICD-10-CM | POA: Insufficient documentation

## 2022-12-18 DIAGNOSIS — I10 Essential (primary) hypertension: Secondary | ICD-10-CM | POA: Diagnosis not present

## 2022-12-18 DIAGNOSIS — Z7951 Long term (current) use of inhaled steroids: Secondary | ICD-10-CM | POA: Insufficient documentation

## 2022-12-18 DIAGNOSIS — Z1152 Encounter for screening for COVID-19: Secondary | ICD-10-CM | POA: Diagnosis not present

## 2022-12-18 DIAGNOSIS — R079 Chest pain, unspecified: Secondary | ICD-10-CM | POA: Diagnosis present

## 2022-12-18 DIAGNOSIS — R072 Precordial pain: Secondary | ICD-10-CM | POA: Diagnosis not present

## 2022-12-18 LAB — COMPREHENSIVE METABOLIC PANEL
ALT: 21 U/L (ref 0–44)
AST: 18 U/L (ref 15–41)
Albumin: 4.2 g/dL (ref 3.5–5.0)
Alkaline Phosphatase: 58 U/L (ref 38–126)
Anion gap: 8 (ref 5–15)
BUN: 9 mg/dL (ref 6–20)
CO2: 27 mmol/L (ref 22–32)
Calcium: 9.3 mg/dL (ref 8.9–10.3)
Chloride: 104 mmol/L (ref 98–111)
Creatinine, Ser: 0.72 mg/dL (ref 0.44–1.00)
GFR, Estimated: >60 mL/min (ref >60)
Glucose, Bld: 96 mg/dL (ref 70–99)
Potassium: 3.6 mmol/L (ref 3.5–5.1)
Sodium: 139 mmol/L (ref 135–145)
Total Bilirubin: 0.3 mg/dL (ref 0.3–1.2)
Total Protein: 7.7 g/dL (ref 6.5–8.1)

## 2022-12-18 LAB — CBC WITH DIFFERENTIAL/PLATELET
Abs Immature Granulocytes: 0.01 10*3/uL (ref 0.00–0.07)
Basophils Absolute: 0 10*3/uL (ref 0.0–0.1)
Basophils Relative: 0 %
Eosinophils Absolute: 0.1 10*3/uL (ref 0.0–0.5)
Eosinophils Relative: 2 %
HCT: 36.3 % (ref 36.0–46.0)
Hemoglobin: 12.8 g/dL (ref 12.0–15.0)
Immature Granulocytes: 0 %
Lymphocytes Relative: 45 %
Lymphs Abs: 3 10*3/uL (ref 0.7–4.0)
MCH: 31 pg (ref 26.0–34.0)
MCHC: 35.3 g/dL (ref 30.0–36.0)
MCV: 87.9 fL (ref 80.0–100.0)
Monocytes Absolute: 0.6 10*3/uL (ref 0.1–1.0)
Monocytes Relative: 9 %
Neutro Abs: 2.9 10*3/uL (ref 1.7–7.7)
Neutrophils Relative %: 44 %
Platelets: 405 10*3/uL — ABNORMAL HIGH (ref 150–400)
RBC: 4.13 MIL/uL (ref 3.87–5.11)
RDW: 11.2 % — ABNORMAL LOW (ref 11.5–15.5)
WBC: 6.6 10*3/uL (ref 4.0–10.5)
nRBC: 0 % (ref 0.0–0.2)

## 2022-12-18 LAB — RESP PANEL BY RT-PCR (RSV, FLU A&B, COVID)  RVPGX2
Influenza A by PCR: NEGATIVE
Influenza B by PCR: NEGATIVE
Resp Syncytial Virus by PCR: NEGATIVE
SARS Coronavirus 2 by RT PCR: NEGATIVE

## 2022-12-18 NOTE — ED Triage Notes (Signed)
Pt c/o CP x2wks, SHOB, pain worse w deep inspiration, congestion, bodyaches. Daughter has strep

## 2022-12-19 ENCOUNTER — Emergency Department (HOSPITAL_BASED_OUTPATIENT_CLINIC_OR_DEPARTMENT_OTHER)
Admission: EM | Admit: 2022-12-19 | Discharge: 2022-12-19 | Disposition: A | Discharge status: Home / Self Care | Payer: Medicaid Other | Attending: Emergency Medicine | Admitting: Emergency Medicine

## 2022-12-19 DIAGNOSIS — R072 Precordial pain: Secondary | ICD-10-CM

## 2022-12-19 NOTE — ED Notes (Signed)
Rounded on pt and updated wait status. Pt verbalized understanding and encouraged to make staff aware of any changes and/or worsening of condition.

## 2022-12-19 NOTE — ED Provider Notes (Signed)
[facility] EMERGENCY DEPT Provider Note   CSN: [id number] Arrival date & time: 12/18/22  1847     History  Chief Complaint  Patient presents with   Shortness of Breath   Chest Pain    Stacey Reid is a 26 y.o. female.  The history is provided by the patient.  Patient presents for multiple complaints.  She reports about 2 weeks ago she started having right-sided chest pain.  It is worse with cough, breathing and movement.  No recent trauma.  No fevers or chills.  She reports cough is improving.  No hemoptysis.  She reports mild congestion and a brief episode of sore throat that improved.  She reports her daughter has been sick with strep She reports at times the pain was in her right arm.  She reports intermittent shortness of breath  No known history of CAD/VTE.  She is a non-smoker.  She does not take oral contraceptives   Past Medical History:  Diagnosis Date   Anemia affecting pregnancy 03/21/2017   Asthma    years ago, exercise induced   Headache    with pregnancy   Heart murmur    history of, resolved 3 months later   Hyperemesis 2016   Hypertension    Infection    UTI   Pregnancy induced hypertension    Syncope    in high school, found out had a heart murmur   Thyroid disease     Home Medications Prior to Admission medications   Medication Sig Start Date End Date Taking? Authorizing Provider  albuterol (VENTOLIN HFA) 108 (90 Base) MCG/ACT inhaler Inhale 1-2 puffs into the lungs every 6 (six) hours as needed for shortness of breath. 09/15/19   [provider]  diclofenac Sodium (VOLTAREN) 1 % GEL Apply 2 g topically 4 (four) times daily. 06/18/20   Caccavale, Sophia, PA-C  fluticasone (FLONASE) 50 MCG/ACT nasal spray Place 1 spray into both nostrils daily. 06/03/21   Petrucelli, Samantha R, PA-C  lidocaine (XYLOCAINE) 2 % solution Use as directed 15 mLs in the mouth or throat every 4 (four) hours as needed for mouth pain. 06/03/21    Petrucelli, Pleas Koch, PA-C  metoCLOPramide (REGLAN) 10 MG tablet Take 1 tablet (10 mg total) by mouth every 6 (six) hours. Patient not taking: Reported on 03/16/2020 02/08/20 04/28/20  Anselm Pancoast, PA-C      Allergies    Patient has no known allergies.    Review of Systems   Review of Systems  Constitutional:  Negative for fever.  Respiratory:  Positive for cough.   Cardiovascular:  Positive for chest pain.  Neurological:  Negative for syncope.    Physical Exam Updated Vital Signs BP 120/80   Pulse 74   Temp 98 F (36.7 C) (Oral)   Resp 16   SpO2 100%  Physical Exam CONSTITUTIONAL: Well developed/well nourished, no distress HEAD: Normocephalic/atraumatic EYES: EOMI/PERRL ENMT: Mucous membranes moist NECK: supple no meningeal signs SPINE/BACK:entire spine nontender CV: S1/S2 noted, no murmurs/rubs/gallops noted LUNGS: Lungs are clear to auscultation bilaterally, no apparent distress Chest-mild tenderness to palpation right anterior chest, no crepitus ABDOMEN: soft, nontender NEURO: Pt is awake/alert/appropriate, moves all extremitiesx4.  No facial droop.   EXTREMITIES: pulses normal/equal, full ROM, no lower extremity edema or tenderness SKIN: warm, color normal PSYCH: no abnormalities of mood noted, alert and oriented to situation  ED Results / Procedures / Treatments   Labs (all labs ordered are listed, but only abnormal results are displayed)  Labs Reviewed  CBC WITH DIFFERENTIAL/PLATELET - Abnormal; Notable for the following components:      Result Value   RDW 11.2 (*)    Platelets 405 (*)    All other components within normal limits  RESP PANEL BY RT-PCR (RSV, FLU A&B, COVID)  RVPGX2  GROUP A STREP BY PCR  COMPREHENSIVE METABOLIC PANEL    EKG EKG Interpretation  Date/Time:  Tuesday December 18 2022 18:58:02 EST Ventricular Rate:  72 PR Interval:  134 QRS Duration: 76 QT Interval:  388 QTC Calculation: 424 R Axis:   -15 Text Interpretation: Normal  sinus rhythm Septal infarct , age undetermined No significant change since last tracing Confirmed by Zadie Rhine ([id number]) on 12/19/2022 4:01:09 AM  Radiology DG Chest 1 View  Result Date: 12/18/2022 CLINICAL DATA:  Shortness of breath and chest pain EXAM: CHEST  1 VIEW COMPARISON:  05/21/2022 FINDINGS: The heart size and mediastinal contours are within normal limits. Both lungs are clear. The visualized skeletal structures are unremarkable. IMPRESSION: No active disease. Electronically Signed   By: Minerva Fester M.D.   On: 12/18/2022 20:41    Procedures Procedures    Medications Ordered in ED Medications - No data to display  ED Course/ Medical Decision Making/ A&P                           Medical Decision Making Amount and/or Complexity of Data Reviewed Labs: ordered.   This patient presents to the ED for concern of chest pain, this involves an extensive number of treatment options, and is a complaint that carries with it a high risk of complications and morbidity.  The differential diagnosis includes but is not limited to acute coronary syndrome, aortic dissection, pulmonary embolism, pericarditis, pneumothorax, pneumonia, myocarditis, pleurisy, esophageal rupture   Social Determinants of Health: Patient's impaired access to primary care  increases the complexity of managing their presentation  Additional history obtained: Records reviewed Care Everywhere/External Records  Lab Tests: I Ordered, and personally interpreted labs.  The pertinent results include: Labs overall unremarkable  Imaging Studies ordered: I ordered imaging studies including X-ray chest   I independently visualized and interpreted imaging which showed no acute findings I agree with the radiologist interpretation  Test Considered: Patient is low risk / negative by [facility], therefore do not feel that PE testing is indicated.  Complexity of problems addressed: Patient's presentation is most consistent  with  acute presentation with potential threat to life or bodily function  Disposition: After consideration of the diagnostic results and the patient's response to treatment,  I feel that the patent would benefit from discharge   .    Patient presents with right-sided chest pain for up to 2 weeks.  She reports has been ongoing every day for 2 weeks.  It is worse with movement, coughing and breathing.  Her vital signs are appropriate.  No acute EKG changes to suggest ACS.  I have low suspicion for dissection.  She is low risk for PE and is PERC negative Will defer further workup at this time. Advised NSAIDs, rest and we discussed strict return precautions       Final Clinical Impression(s) / ED Diagnoses Final diagnoses:  Precordial pain    Rx / DC Orders ED Discharge Orders     None         Zadie Rhine, MD 12/19/22 [phone]

## 2022-12-19 NOTE — ED Notes (Signed)
No answer x 1 attempt to room pt

## 2022-12-19 NOTE — Discharge Instructions (Signed)

## 2022-12-22 ENCOUNTER — Other Ambulatory Visit: Payer: Self-pay

## 2022-12-22 ENCOUNTER — Emergency Department (HOSPITAL_BASED_OUTPATIENT_CLINIC_OR_DEPARTMENT_OTHER)
Admission: EM | Admit: 2022-12-22 | Discharge: 2022-12-22 | Disposition: A | Discharge status: Home / Self Care | Payer: Medicaid Other | Attending: Emergency Medicine | Admitting: Emergency Medicine

## 2022-12-22 ENCOUNTER — Encounter (HOSPITAL_BASED_OUTPATIENT_CLINIC_OR_DEPARTMENT_OTHER): Payer: Self-pay | Admitting: Emergency Medicine

## 2022-12-22 DIAGNOSIS — R519 Headache, unspecified: Secondary | ICD-10-CM | POA: Diagnosis present

## 2022-12-22 DIAGNOSIS — G43909 Migraine, unspecified, not intractable, without status migrainosus: Secondary | ICD-10-CM | POA: Diagnosis not present

## 2022-12-22 MED ORDER — PREDNISONE 10 MG PO TABS: 20.0000 mg | ORAL_TABLET | Freq: Once | ORAL | 0 refills | Status: DC

## 2022-12-22 MED ORDER — DIPHENHYDRAMINE HCL 25 MG PO TABS: 50.0000 mg | ORAL_TABLET | Freq: Four times a day (QID) | ORAL | 0 refills | Status: DC | PRN

## 2022-12-22 MED ORDER — PROCHLORPERAZINE MALEATE 10 MG PO TABS: 10.0000 mg | ORAL_TABLET | Freq: Once | ORAL | 0 refills | Status: DC

## 2022-12-22 MED ORDER — PREDNISONE 10 MG PO TABS: 20.0000 mg | ORAL_TABLET | Freq: Once | ORAL | 0 refills | Status: AC

## 2022-12-22 NOTE — ED Provider Notes (Signed)
[facility] EMERGENCY DEPARTMENT Provider Note   CSN: [id number] Arrival date & time: 12/22/22  1712     History Chief Complaint  Patient presents with   Headache    HPI Stacey Reid is a 26 y.o. female presenting for headache over the past 2 weeks.  She has had multiple symptoms over the last month has been seen by her PCP. Tonight she presents with her 44 and 13-year-old at bedside.  States that she has been having intermittent headaches.  She states she has not able to find a ride to get her home or for anyone to come take care of her kids. Denies fevers or chills, nausea vomiting, syncope or shortness of breath otherwise ambulatory tolerating p.o. intake.  Has been intermittent over the last 2 weeks  Patient's recorded medical, surgical, social, medication list and allergies were reviewed in the Snapshot window as part of the initial history.   Review of Systems   Review of Systems  Constitutional:  Negative for chills and fever.  HENT:  Negative for ear pain and sore throat.   Eyes:  Negative for pain and visual disturbance.  Respiratory:  Negative for cough and shortness of breath.   Cardiovascular:  Negative for chest pain and palpitations.  Gastrointestinal:  Negative for abdominal pain and vomiting.  Genitourinary:  Negative for dysuria and hematuria.  Musculoskeletal:  Negative for arthralgias and back pain.  Skin:  Negative for color change and rash.  Neurological:  Positive for headaches. Negative for seizures and syncope.  All other systems reviewed and are negative.   Physical Exam Updated Vital Signs BP (!) 118/93 (BP Location: Right Arm)   Pulse 76   Temp 98.1 F (36.7 C) (Oral)   Resp 16   Ht 5\' 2"  (1.575 m)   Wt 80.3 kg   LMP 12/14/2022   SpO2 99%   BMI 32.37 kg/m  Physical Exam Vitals and nursing note reviewed.  Constitutional:      General: She is not in acute distress.    Appearance: She is well-developed.  HENT:     Head:  Normocephalic and atraumatic.  Eyes:     Conjunctiva/sclera: Conjunctivae normal.  Cardiovascular:     Rate and Rhythm: Normal rate and regular rhythm.     Heart sounds: No murmur heard. Pulmonary:     Effort: Pulmonary effort is normal. No respiratory distress.     Breath sounds: Normal breath sounds.  Abdominal:     Palpations: Abdomen is soft.     Tenderness: There is no abdominal tenderness.  Musculoskeletal:        General: No swelling.     Cervical back: Neck supple.  Skin:    General: Skin is warm and dry.     Capillary Refill: Capillary refill takes less than 2 seconds.  Neurological:     Mental Status: She is alert.  Psychiatric:        Mood and Affect: Mood normal.      ED Course/ Medical Decision Making/ A&P    Procedures Procedures   Medications Ordered in ED Medications - No data to display Medical Decision Making:   Stacey Reid is a 26 y.o. female who presented to the ED today with 2 weeks of intermittent headaches detailed above.    External chart has been reviewed including prior emergency department visits demonstrating CT head with no acute findings. Patient's presentation is complicated by their history of complex migraines in the past.  Patient placed  on continuous vitals and telemetry monitoring while in ED which was reviewed periodically.   Complete initial physical exam performed, notably the patient  was hemodynamically stable in no acute distress no neurologic abnormalities.    Reviewed and confirmed nursing documentation for past medical history, family history, social history.    Initial Assessment:   With the patient's presentation of headache, most likely diagnosis is tension type headaches vs atypical migraines. Other diagnoses were considered including (but not limited to) intracranial mass, intracranial hemorrhage, intracranial infection including meningitis vs encephalitis, GCA, trigeminal neuralgia. These are considered less  likely due to history of present illness and physical exam findings.    This is most consistent with an acute life/limb threatening illness complicated by underlying chronic conditions.   Timeline and slow onset is not consistent with SAH/ICH   Age and description of pain is not consistent with GCA   Lack of fever,meningismus is not consistent with meningitis/encephalitis   Initial Plan:  She is having headaches that are persistent.  I would like to proceed with IV therapeutic treatment of Compazine, steroid, Benadryl, IV fluid and reassess for improvement of symptoms.  I informed her that if her symptoms were not improving I would proceed to a CT scan as well as blood work to further evaluate for potential etiologies of her headache. Patient declined these interventions because she cannot get a ride home after getting those medicines. I informed the patient that our capability to evaluate patient is severely limited by her inability to get a ride home tonight if we were to treat her tonight, as well as by her bringing her two minor children.  I asked if anyone would be able to come get her children so that we could treat her and observe her and patient declined. I offered cross-sectional imaging today.  I reviewed her prior images which were nondiagnostic and reviewed that given her history of similar headaches, a repeat CT scan is unlikely be beneficial.  However I offered it as further objective evaluation and patient declined. Additionally, since we are limited in emergency department evaluation, we will trial p.o. Compazine and Benadryl as well as prednisone for p.o. treatment of migraine like episode in the outpatient setting will plan for patient to return for further care and management should her symptoms persist. Disposition:  I have considered need for hospitalization, however, considering all of the above, I believe this patient is stable for discharge at this time.  Patient/family  educated about specific return precautions for given chief complaint and symptoms.  Patient/family educated about follow-up with PCP and neurology.     Patient/family expressed understanding of return precautions and need for follow-up. Patient spoken to regarding all imaging and laboratory results and appropriate follow up for these results. All education provided in verbal form with additional information in written form. Time was allowed for answering of patient questions. Patient discharged.    Emergency Department Medication Summary:   Medications - No data to display        Clinical Impression:  1. Migraine without status migrainosus, not intractable, unspecified migraine type      Discharge   Clinical Impression:  1. Migraine without status migrainosus, not intractable, unspecified migraine type      Discharge   Final Clinical Impression(s) / ED Diagnoses Final diagnoses:  Migraine without status migrainosus, not intractable, unspecified migraine type    Rx / DC Orders ED Discharge Orders          Ordered  prochlorperazine (COMPAZINE) 10 MG tablet   Once        12/22/22 1941    diphenhydrAMINE (BENADRYL) 25 MG tablet  Every 6 hours PRN        12/22/22 1941    predniSONE (DELTASONE) 10 MG tablet   Once,   Status:  Discontinued        12/22/22 1942    predniSONE (DELTASONE) 10 MG tablet   Once        12/22/22 1942              Glyn Ade, MD 12/22/22 1942

## 2022-12-22 NOTE — Discharge Instructions (Addendum)
You were seen today for a headache.  We are limited in our ability to evaluate you because we cannot treat you for symptomatic improvement because you have to drive home tonight with your children.  A standard emergency department evaluation for headache like this would be IV treatment in the emergency department observation for 4 to 6 hours to ensure improvement of symptoms and strict return precautions.  However we are not able to perform this tonight because of the other limitations.   Will trial by mouth medications including Compazine and Benadryl tonight as well as a dose of prednisone tonight.  If the symptoms are not improving with these medications you will need to immediately return to emergency department for further care and management.

## 2022-12-22 NOTE — ED Triage Notes (Signed)
Pt c/o HAs x 1 wk, worse x 2d; hx of migraines

## 2023-04-08 ENCOUNTER — Encounter: Payer: Self-pay | Admitting: *Deleted

## 2023-09-21 ENCOUNTER — Emergency Department (HOSPITAL_COMMUNITY)
Admission: EM | Admit: 2023-09-21 | Discharge: 2023-09-21 | Discharge status: Against Medical Advice | Payer: Medicaid Other | Attending: Emergency Medicine | Admitting: Emergency Medicine

## 2023-09-21 DIAGNOSIS — Z5321 Procedure and treatment not carried out due to patient leaving prior to being seen by health care provider: Secondary | ICD-10-CM | POA: Insufficient documentation

## 2023-09-21 DIAGNOSIS — R0602 Shortness of breath: Secondary | ICD-10-CM | POA: Diagnosis present

## 2023-09-21 NOTE — ED Notes (Signed)
Unable to locate multiple times by staff at waiting area .

## 2023-09-21 NOTE — ED Notes (Signed)
Unable to locate patient at waiting area. 

## 2023-09-22 ENCOUNTER — Encounter (HOSPITAL_COMMUNITY): Payer: Self-pay | Admitting: *Deleted

## 2023-09-22 ENCOUNTER — Emergency Department (HOSPITAL_COMMUNITY): Payer: Medicaid Other

## 2023-09-22 ENCOUNTER — Emergency Department (HOSPITAL_COMMUNITY)
Admission: EM | Admit: 2023-09-22 | Discharge: 2023-09-23 | Discharge status: Against Medical Advice | Payer: Medicaid Other | Attending: Emergency Medicine | Admitting: Emergency Medicine

## 2023-09-22 ENCOUNTER — Other Ambulatory Visit: Payer: Self-pay

## 2023-09-22 DIAGNOSIS — Z5321 Procedure and treatment not carried out due to patient leaving prior to being seen by health care provider: Secondary | ICD-10-CM | POA: Insufficient documentation

## 2023-09-22 DIAGNOSIS — R079 Chest pain, unspecified: Secondary | ICD-10-CM | POA: Insufficient documentation

## 2023-09-22 DIAGNOSIS — R519 Headache, unspecified: Secondary | ICD-10-CM | POA: Diagnosis present

## 2023-09-22 LAB — BASIC METABOLIC PANEL
Anion gap: 10 (ref 5–15)
BUN: 12 mg/dL (ref 6–20)
CO2: 19 mmol/L — ABNORMAL LOW (ref 22–32)
Calcium: 9 mg/dL (ref 8.9–10.3)
Chloride: 106 mmol/L (ref 98–111)
Creatinine, Ser: 0.93 mg/dL (ref 0.44–1.00)
GFR, Estimated: >60 mL/min (ref >60)
Glucose, Bld: 95 mg/dL (ref 70–99)
Potassium: 3.8 mmol/L (ref 3.5–5.1)
Sodium: 135 mmol/L (ref 135–145)

## 2023-09-22 LAB — CBC
HCT: 39.9 % (ref 36.0–46.0)
Hemoglobin: 13.5 g/dL (ref 12.0–15.0)
MCH: 29.6 pg (ref 26.0–34.0)
MCHC: 33.8 g/dL (ref 30.0–36.0)
MCV: 87.5 fL (ref 80.0–100.0)
Platelets: 447 10*3/uL — ABNORMAL HIGH (ref 150–400)
RBC: 4.56 MIL/uL (ref 3.87–5.11)
RDW: 11.4 % — ABNORMAL LOW (ref 11.5–15.5)
WBC: 6.8 10*3/uL (ref 4.0–10.5)
nRBC: 0 % (ref 0.0–0.2)

## 2023-09-22 LAB — HCG, SERUM, QUALITATIVE: Preg, Serum: NEGATIVE

## 2023-09-22 LAB — TROPONIN I (HIGH SENSITIVITY): Troponin I (High Sensitivity): <2 ng/L (ref <18)

## 2023-09-22 NOTE — ED Triage Notes (Signed)
The pt is c/o a headache and chest pain for 3 days  lmp sept 6th

## 2023-09-22 NOTE — ED Notes (Signed)
Pt leaving AMA. States that she will view her test results on MyChart application

## 2023-09-23 ENCOUNTER — Ambulatory Visit
Admission: RE | Admit: 2023-09-23 | Discharge: 2023-09-23 | Disposition: A | Discharge status: Home / Self Care | Payer: Medicaid Other | Source: Ambulatory Visit | Attending: Internal Medicine | Admitting: Internal Medicine

## 2023-09-23 VITALS — BP 116/81 | HR 84 | Temp 98.1°F | Resp 16

## 2023-09-23 DIAGNOSIS — R0602 Shortness of breath: Secondary | ICD-10-CM | POA: Diagnosis present

## 2023-09-23 DIAGNOSIS — R197 Diarrhea, unspecified: Secondary | ICD-10-CM | POA: Insufficient documentation

## 2023-09-23 DIAGNOSIS — Z1152 Encounter for screening for COVID-19: Secondary | ICD-10-CM | POA: Diagnosis not present

## 2023-09-23 DIAGNOSIS — R1084 Generalized abdominal pain: Secondary | ICD-10-CM | POA: Diagnosis not present

## 2023-09-23 DIAGNOSIS — R079 Chest pain, unspecified: Secondary | ICD-10-CM | POA: Insufficient documentation

## 2023-09-23 LAB — POCT URINALYSIS DIP (MANUAL ENTRY)
Bilirubin, UA: NEGATIVE
Blood, UA: NEGATIVE
Glucose, UA: NEGATIVE mg/dL
Ketones, POC UA: NEGATIVE mg/dL
Nitrite, UA: NEGATIVE
Protein Ur, POC: NEGATIVE mg/dL
Spec Grav, UA: 1.025 (ref 1.010–1.025)
Urobilinogen, UA: 1 U/dL
pH, UA: 5.5 (ref 5.0–8.0)

## 2023-09-23 LAB — POCT RAPID STREP A (OFFICE): Rapid Strep A Screen: NEGATIVE

## 2023-09-23 LAB — POCT INFLUENZA A/B
Influenza A, POC: NEGATIVE
Influenza B, POC: NEGATIVE

## 2023-09-23 LAB — POCT URINE PREGNANCY: Preg Test, Ur: NEGATIVE

## 2023-09-23 MED ORDER — ONDANSETRON HCL 4 MG PO TABS: 4.0000 mg | ORAL_TABLET | Freq: Three times a day (TID) | ORAL | 0 refills | Status: DC | PRN

## 2023-09-23 NOTE — Discharge Instructions (Signed)
A bland diet is recommend at this time such as the B.R.A.T diet (Broth, Rice, Applesauce, Toast) as tolerated. Zofran is a nausea medication that has been prescribed to you. If you are unable to tolerate fluids despite taking the medication, then it is recommend you go to the ER for further evaluation.   Please noted our office is limited in the tests and imaging we can perform at our facility. However, some abdominal problems make take more time to appear. Therefore, it is very important for you to pay attention to any new symptoms or worsening of your current condition.   Please go directly to the Emergency Department immediately should you begin to feel worse in any way or have any of the following symptoms: increasing or different abdominal pain, persistent vomiting, inability to drink fluids, fevers, bloody bowel movements, or begin vomiting blood.   I also recommend you follow up with your PCP for your increased shortness of breath. If at anytime your symptoms start to change, become worse, or new signs/symptoms develop, you need to go directly to the ER.

## 2023-09-23 NOTE — ED Triage Notes (Signed)
Went to ED last night, left prior to being seen; Test results were done but not reviewed.   Starting Saturday c/o SOB, back pain, chest pain, abd pain, N, things tasting funny, loss of appetite. Family with similar sxs. States she was on new medications starting about 7 days ago but stopped taking them on Sunday.  Denies: sore throat,

## 2023-09-23 NOTE — ED Provider Notes (Signed)
[facility]  Note:  This document was prepared using Dragon voice recognition software and may include unintentional dictation errors.  MRN: [id number] DOB: Nov 24, 1996 DATE: 09/23/23   Subjective:  Chief Complaint:  Chief Complaint  Patient presents with   Abdominal Pain    Shortness of breath , nausea, things tasting funny. Loss appetite, back pain , chest pain - Entered by patient    HPI: Stacey Reid is a 27 y.o. female presenting for multiple complaints.   Patient states that last week her son started feeling ill. She states he had one episode of vomiting and chills. She reports him vomiting directly on her. She started having similar symptoms on Saturday. Reporting abdominal pain and low back pain with diarrhea. One loose stool today with no blood. She also reports nausea, but no vomiting. Abdominal pain appears to wax and wane. She has not taken anything for her symptoms. Reports intense abdominal pain and then bowel movement which helps with pain.  Patient also reports increased SOB around the same time. She states she has been using her inhaler more often with only slight relief. She also reports some chest discomfort at the center of her chest. She went to the ER last night and had several tests performed, but left AMA prior to having them review. Reports some continued aching and shortness of breath today.  Of note, she reports started new medications about 7 days ago including phentermine and Topamax. She states she stopped the medications for one days and symptoms continued.   Denies fever, vomiting, dysuria, cough, sore throat, congestion. Endorses diarrhea, abdominal pain, myalgias, shortness of breath. Presents NAD.  Prior to Admission medications   Medication Sig Start Date End Date Taking? Authorizing Provider  albuterol (VENTOLIN HFA) 108 (90 Base) MCG/ACT inhaler Inhale 1-2 puffs into the lungs every 6 (six) hours as needed for shortness of breath.  09/15/19   [provider]  diclofenac Sodium (VOLTAREN) 1 % GEL Apply 2 g topically 4 (four) times daily. 06/18/20   Caccavale, Sophia, PA-C  diphenhydrAMINE (BENADRYL) 25 MG tablet Take 2 tablets (50 mg total) by mouth every 6 (six) hours as needed for up to 1 dose. 12/22/22   Glyn Ade, MD  fluticasone (FLONASE) 50 MCG/ACT nasal spray Place 1 spray into both nostrils daily. 06/03/21   Petrucelli, Samantha R, PA-C  lidocaine (XYLOCAINE) 2 % solution Use as directed 15 mLs in the mouth or throat every 4 (four) hours as needed for mouth pain. 06/03/21   Petrucelli, Samantha R, PA-C  prochlorperazine (COMPAZINE) 10 MG tablet Take 1 tablet (10 mg total) by mouth once for 1 dose. 12/22/22 12/22/22  Glyn Ade, MD  metoCLOPramide (REGLAN) 10 MG tablet Take 1 tablet (10 mg total) by mouth every 6 (six) hours. Patient not taking: Reported on 03/16/2020 02/08/20 04/28/20  Anselm Pancoast, PA-C     No Known Allergies  History:   Past Medical History:  Diagnosis Date   Anemia affecting pregnancy 03/21/2017   Asthma    years ago, exercise induced   Headache    with pregnancy   Heart murmur    history of, resolved 3 months later   Hyperemesis 2016   Hypertension    Infection    UTI   Pregnancy induced hypertension    Syncope    in high school, found out had a heart murmur   Thyroid disease      Past Surgical History:  Procedure Laterality Date   LAPAROSCOPIC TUBAL  LIGATION Bilateral 07/02/2017   Procedure: LAPAROSCOPIC TUBAL LIGATION - Filshie clips;  Surgeon: Hermina Staggers, MD;  Location: [facility];  Service: Gynecology;  Laterality: Bilateral;   WISDOM TOOTH EXTRACTION      Family History  Problem Relation Age of Onset   Asthma Mother    Hypertension Mother    Miscarriages / [place] Mother    Depression Mother    Lupus Mother    Healthy Father     Social History   Tobacco Use   Smoking status: Never   Smokeless tobacco: Never  Vaping Use   Vaping status:  Never Used  Substance Use Topics   Alcohol use: Yes    Comment: daily   Drug use: Not Currently    Types: Marijuana    Comment: quit 12/2019    Review of Systems  Constitutional:  Positive for appetite change. Negative for fever.  HENT:  Negative for congestion, rhinorrhea and sore throat.   Respiratory:  Positive for shortness of breath. Negative for cough.   Cardiovascular:  Positive for chest pain.  Gastrointestinal:  Positive for abdominal pain, diarrhea and nausea. Negative for blood in stool and vomiting.  Genitourinary:  Negative for dysuria.  Musculoskeletal:  Positive for myalgias.     Objective:   Vitals: BP 116/81 (BP Location: Right Arm)   Pulse 84   Temp 98.1 F (36.7 C) (Oral)   Resp 16   LMP 08/30/2023   SpO2 98%   Physical Exam Constitutional:      General: She is not in acute distress.    Appearance: Normal appearance. She is well-developed and normal weight. She is not ill-appearing or toxic-appearing.  HENT:     Head: Normocephalic and atraumatic.  Cardiovascular:     Rate and Rhythm: Normal rate and regular rhythm.     Heart sounds: Normal heart sounds.  Pulmonary:     Effort: Pulmonary effort is normal.     Breath sounds: Normal breath sounds.     Comments: Clear to auscultation bilaterally  Abdominal:     General: Bowel sounds are normal.     Palpations: Abdomen is soft.     Tenderness: There is no abdominal tenderness. There is no right CVA tenderness or left CVA tenderness.     Comments: No acute abdomen   Musculoskeletal:     Lumbar back: Normal.  Skin:    General: Skin is warm and dry.  Neurological:     General: No focal deficit present.     Mental Status: She is alert.  Psychiatric:        Mood and Affect: Mood and affect normal.     Results:  Labs: Results for orders placed or performed during the hospital encounter of 09/23/23 (from the past 24 hour(s))  POCT urinalysis dipstick     Status: Abnormal   Collection Time:  09/23/23  3:39 PM  Result Value Ref Range   Color, UA yellow yellow   Clarity, UA cloudy (A) clear   Glucose, UA negative negative mg/dL   Bilirubin, UA negative negative   Ketones, POC UA negative negative mg/dL   Spec Grav, UA 2.952 8.413 - 1.025   Blood, UA negative negative   pH, UA 5.5 5.0 - 8.0   Protein Ur, POC negative negative mg/dL   Urobilinogen, UA 1.0 0.2 or 1.0 E.U./dL   Nitrite, UA Negative Negative   Leukocytes, UA Trace (A) Negative  POCT urine pregnancy     Status: None   Collection  Time: 09/23/23  3:39 PM  Result Value Ref Range   Preg Test, Ur Negative Negative  POCT Influenza A/B     Status: None   Collection Time: 09/23/23  3:39 PM  Result Value Ref Range   Influenza A, POC Negative Negative   Influenza B, POC Negative Negative  POCT rapid strep A     Status: None   Collection Time: 09/23/23  3:39 PM  Result Value Ref Range   Rapid Strep A Screen Negative Negative    Radiology: DG Chest 2 View  Result Date: 09/22/2023 CLINICAL DATA:  Chest pain EXAM: CHEST - 2 VIEW COMPARISON:  12/18/2022 FINDINGS: The heart size and mediastinal contours are within normal limits. Both lungs are clear. The visualized skeletal structures are unremarkable. IMPRESSION: No acute abnormality of the lungs. Electronically Signed   By: Jearld Lesch M.D.   On: 09/22/2023 18:52     UC Course/Treatments:  Procedures: ED EKG  Date/Time: 09/23/2023 4:27 PM  Performed by: Cynda Acres, PA-C Authorized by: Cynda Acres, PA-C   Previous ECG:    Previous ECG:  Unavailable Interpretation:    Interpretation: normal   Rate:    ECG rate:  77   ECG rate assessment: normal   Rhythm:    Rhythm: sinus rhythm   Ectopy:    Ectopy: none   QRS:    QRS axis:  Normal   QRS intervals:  Normal   QRS conduction: normal   ST segments:    ST segments:  Non-specific    Medications Ordered in UC: Medications - No data to display   Assessment and Plan :     ICD-10-CM   1.  Diarrhea, unspecified type  R19.7 SARS CORONAVIRUS 2 (TAT 6-24 HRS) Anterior Nasal Swab    SARS CORONAVIRUS 2 (TAT 6-24 HRS) Anterior Nasal Swab    2. Generalized abdominal pain  R10.84     3. Chest pain, unspecified type  R07.9     4. Shortness of breath  R06.02      Diarrhea, unspecified type Afebrile, nontoxic-appearing, NAD. VSS. DDX includes but not limited to: viral etiology, gastroenteritis, colitis, e.coli GI panel is pending. Suspect possible viral etiology given family member with similar symptoms versus secondary to phentermine. Recommend increase oral fluids such as water and Pedialyte and BRAT diet. CBC and BMP were unremarkable less than 24 hours ago. Strict ED precautions were given and patient verbalized understanding.  Generalized Abdominal Pain Afebrile, nontoxic-appearing, NAD. VSS. DDX includes but not limited to: viral etiology, gastroenteritis, colitis, e.coli, cholecystitis Improves with bowel movements. Strep was negative today in office. UPT was negative. UA was positive for trace leukocytes. Urine culture is pending. Will treat based on results. CBC and BMP were unremarkable less than 24 hours ago. Zofran 4mg  every 8 hours PRN was prescribed for nausea. Recommend she avoid phentermine and follow up with her PCP this week. Strict ED precautions were given and patient verbalized understanding.  Chest Pain Afebrile, nontoxic-appearing, NAD. VSS. DDX includes but not limited to: MI, anxiety, ACS, asthma, precordial pain EKG was unremarkable today in office. Troponin was negative less than 24 hours ago, but only one could be performed. BMP and CBC were unremarkable as well. CXR was negative. Recommend she follow up with PCP this week for cardiology referral. Strict ED precautions were given and patient verbalized understanding.  Shortness of Breath Afebrile, nontoxic-appearing, NAD. VSS. DDX includes but not limited to: MI, anxiety, ACS, asthma, PE Flu was negative  today in office. COVID is pending. EKG was unremarkable today in office. Troponin was negative less than 24 hours ago, but only one could be performed. BMP and CBC were unremarkable as well. CXR was negative. Recommend she follow up with PCP this week for cardiology referral. Strict ED precautions were given and patient verbalized understanding.  ED Discharge Orders          Ordered    ondansetron (ZOFRAN) 4 MG tablet  Every 8 hours PRN        09/23/23 1607    Gastrointestinal Panel by PCR , Stool        09/23/23 1628    C Difficile Quick Screen w PCR reflex        09/23/23 1628             I have reviewed the PDMP during this encounter.     Cynda Acres, PA-C 09/23/23 1637

## 2023-09-24 LAB — SARS CORONAVIRUS 2 (TAT 6-24 HRS): SARS Coronavirus 2: NEGATIVE

## 2023-09-25 LAB — URINE CULTURE: Culture: NO GROWTH

## 2023-12-24 ENCOUNTER — Other Ambulatory Visit: Payer: Self-pay

## 2023-12-24 ENCOUNTER — Encounter (HOSPITAL_BASED_OUTPATIENT_CLINIC_OR_DEPARTMENT_OTHER): Payer: Self-pay

## 2023-12-24 ENCOUNTER — Emergency Department (HOSPITAL_BASED_OUTPATIENT_CLINIC_OR_DEPARTMENT_OTHER): Payer: Medicaid Other

## 2023-12-24 DIAGNOSIS — R5383 Other fatigue: Secondary | ICD-10-CM | POA: Insufficient documentation

## 2023-12-24 DIAGNOSIS — Z20822 Contact with and (suspected) exposure to covid-19: Secondary | ICD-10-CM | POA: Diagnosis not present

## 2023-12-24 DIAGNOSIS — M791 Myalgia, unspecified site: Secondary | ICD-10-CM | POA: Diagnosis not present

## 2023-12-24 DIAGNOSIS — R531 Weakness: Secondary | ICD-10-CM | POA: Insufficient documentation

## 2023-12-24 LAB — COMPREHENSIVE METABOLIC PANEL
ALT: 140 U/L — ABNORMAL HIGH (ref 0–44)
AST: 95 U/L — ABNORMAL HIGH (ref 15–41)
Albumin: 3.8 g/dL (ref 3.5–5.0)
Alkaline Phosphatase: 75 U/L (ref 38–126)
Anion gap: 7 (ref 5–15)
BUN: 7 mg/dL (ref 6–20)
CO2: 25 mmol/L (ref 22–32)
Calcium: 8.6 mg/dL — ABNORMAL LOW (ref 8.9–10.3)
Chloride: 106 mmol/L (ref 98–111)
Creatinine, Ser: 0.83 mg/dL (ref 0.44–1.00)
GFR, Estimated: >60 mL/min (ref >60)
Glucose, Bld: 106 mg/dL — ABNORMAL HIGH (ref 70–99)
Potassium: 3.6 mmol/L (ref 3.5–5.1)
Sodium: 138 mmol/L (ref 135–145)
Total Bilirubin: 0.3 mg/dL (ref 0.0–1.2)
Total Protein: 7.7 g/dL (ref 6.5–8.1)

## 2023-12-24 LAB — PREGNANCY, URINE: Preg Test, Ur: NEGATIVE

## 2023-12-24 LAB — URINALYSIS, W/ REFLEX TO CULTURE (INFECTION SUSPECTED)
Bilirubin Urine: NEGATIVE
Glucose, UA: NEGATIVE mg/dL
Hgb urine dipstick: NEGATIVE
Ketones, ur: NEGATIVE mg/dL
Leukocytes,Ua: NEGATIVE
Nitrite: NEGATIVE
Protein, ur: NEGATIVE mg/dL
Specific Gravity, Urine: 1.02 (ref 1.005–1.030)
pH: 6.5 (ref 5.0–8.0)

## 2023-12-24 LAB — CBC WITH DIFFERENTIAL/PLATELET
Abs Immature Granulocytes: 0.01 10*3/uL (ref 0.00–0.07)
Basophils Absolute: 0 10*3/uL (ref 0.0–0.1)
Basophils Relative: 0 %
Eosinophils Absolute: 0.2 10*3/uL (ref 0.0–0.5)
Eosinophils Relative: 3 %
HCT: 39.3 % (ref 36.0–46.0)
Hemoglobin: 13.3 g/dL (ref 12.0–15.0)
Immature Granulocytes: 0 %
Lymphocytes Relative: 49 %
Lymphs Abs: 3 10*3/uL (ref 0.7–4.0)
MCH: 29.8 pg (ref 26.0–34.0)
MCHC: 33.8 g/dL (ref 30.0–36.0)
MCV: 88.1 fL (ref 80.0–100.0)
Monocytes Absolute: 0.5 10*3/uL (ref 0.1–1.0)
Monocytes Relative: 7 %
Neutro Abs: 2.6 10*3/uL (ref 1.7–7.7)
Neutrophils Relative %: 41 %
Platelets: 416 10*3/uL — ABNORMAL HIGH (ref 150–400)
RBC: 4.46 MIL/uL (ref 3.87–5.11)
RDW: 11.8 % (ref 11.5–15.5)
WBC: 6.2 10*3/uL (ref 4.0–10.5)
nRBC: 0 % (ref 0.0–0.2)

## 2023-12-24 LAB — RESP PANEL BY RT-PCR (RSV, FLU A&B, COVID)  RVPGX2
Influenza A by PCR: NEGATIVE
Influenza B by PCR: NEGATIVE
Resp Syncytial Virus by PCR: NEGATIVE
SARS Coronavirus 2 by RT PCR: NEGATIVE

## 2023-12-24 NOTE — ED Triage Notes (Signed)
Pt arrives with c/o generalized bodyaches, dizziness, and chills. Pt recently went on vacation. Pt denies cough, congestion, fevers, or urinary symptoms.

## 2023-12-25 ENCOUNTER — Emergency Department (HOSPITAL_BASED_OUTPATIENT_CLINIC_OR_DEPARTMENT_OTHER)
Admission: EM | Admit: 2023-12-25 | Discharge: 2023-12-25 | Disposition: A | Discharge status: Home / Self Care | Payer: Medicaid Other | Attending: Emergency Medicine | Admitting: Emergency Medicine

## 2023-12-25 ENCOUNTER — Other Ambulatory Visit: Payer: Self-pay

## 2023-12-25 DIAGNOSIS — R531 Weakness: Secondary | ICD-10-CM

## 2023-12-25 DIAGNOSIS — M791 Myalgia, unspecified site: Secondary | ICD-10-CM

## 2023-12-25 LAB — CK: Total CK: 122 U/L (ref 38–234)

## 2023-12-25 LAB — LIPASE, BLOOD: Lipase: 38 U/L (ref 11–51)

## 2023-12-25 LAB — MAGNESIUM: Magnesium: 1.8 mg/dL (ref 1.7–2.4)

## 2023-12-25 MED ORDER — IBUPROFEN 400 MG PO TABS
400.0000 mg | ORAL_TABLET | Freq: Once | ORAL | Status: AC
  Administered 2023-12-25: 400 mg via ORAL
  Filled 2023-12-25: qty 1

## 2023-12-25 NOTE — ED Provider Notes (Signed)
 [facility] Provider Note   CSN: [id number] Arrival date & time: 12/24/23  1931     History  Chief Complaint  Patient presents with   Fatigue    Stacey Reid is a 28 y.o. female.  The history is provided by the patient.  Patient with history of GERD and asthma presents for multiple complaints. Patient reports she returned from a trip to [place] on December 29 After return she has had multiple issues.  She reports diffuse bodyaches particularly in her head neck and back.  She also reports leg pain.  She also reports chest pain and abdominal pain.  No fevers or vomiting. No new weakness.  No rash.  No new medications.  No known sick contacts.  No recent alcohol use.  She is a non-smoker.  She is not on oral contraceptives     Home Medications Prior to Admission medications   Medication Sig Start Date End Date Taking? Authorizing Provider  metoCLOPramide  (REGLAN ) 10 MG tablet Take 1 tablet (10 mg total) by mouth every 6 (six) hours. Patient not taking: Reported on 03/16/2020 02/08/20 04/28/20  Zada Elouise BROCKS, PA-C      Allergies    Patient has no known allergies.    Review of Systems   Review of Systems  Constitutional:  Positive for fatigue. Negative for fever.  Respiratory:  Positive for shortness of breath.   Cardiovascular:  Positive for chest pain. Negative for leg swelling.  Gastrointestinal:  Positive for abdominal pain. Negative for vomiting.  Musculoskeletal:  Positive for back pain and myalgias. Negative for joint swelling.  Neurological:  Positive for headaches. Negative for weakness and numbness.    Physical Exam Updated Vital Signs BP 123/85 (BP Location: Right Arm)   Pulse 70   Temp 98 F (36.7 C) (Oral)   Resp 16   Wt 84.4 kg   SpO2 99%   BMI 32.95 kg/m  Physical Exam CONSTITUTIONAL: Well developed/well nourished HEAD: Normocephalic/atraumatic EYES: EOMI/PERRL, no nystagmus,  no  ptosis ENMT: Mucous membranes moist NECK: supple no meningeal signs CV: S1/S2 noted, no murmurs/rubs/gallops noted LUNGS: Lungs are clear to auscultation bilaterally, no apparent distress ABDOMEN: soft, nontender, no rebound or guarding GU:no cva tenderness NEURO:Awake/alert, face symmetric, no arm or leg drift is noted Equal 5/5 strength with shoulder abduction, elbow flex/extension, wrist flex/extension in upper extremities and equal hand grips bilaterally Equal 5/5 strength with hip flexion,knee flex/extension, foot dorsi/plantar flexion Cranial nerves 3/4/5/6/07/01/09/11/12 tested and intact Gait normal without ataxia Sensation to light touch intact in all extremities EXTREMITIES: pulses normal, full ROM, no joint effusions, no lower extremity edema SKIN: warm, color normal PSYCH: no abnormalities of mood noted   ED Results / Procedures / Treatments   Labs (all labs ordered are listed, but only abnormal results are displayed) Labs Reviewed  COMPREHENSIVE METABOLIC PANEL - Abnormal; Notable for the following components:      Result Value   Glucose, Bld 106 (*)    Calcium 8.6 (*)    AST 95 (*)    ALT 140 (*)    All other components within normal limits  CBC WITH DIFFERENTIAL/PLATELET - Abnormal; Notable for the following components:   Platelets 416 (*)    All other components within normal limits  URINALYSIS, W/ REFLEX TO CULTURE (INFECTION SUSPECTED) - Abnormal; Notable for the following components:   Bacteria, UA FEW (*)    All other components within normal limits  RESP PANEL BY RT-PCR (  RSV, FLU A&B, COVID)  [id number]  PREGNANCY, URINE  MAGNESIUM   CK  LIPASE, BLOOD    EKG EKG Interpretation Date/Time:  Tuesday December 24 2023 23:32:31 EST Ventricular Rate:  66 PR Interval:  134 QRS Duration:  76 QT Interval:  406 QTC Calculation: 425 R Axis:   -20  Text Interpretation: Normal sinus rhythm Minimal voltage criteria for LVH, may be normal variant ( R in aVL )  Interpretation limited secondary to artifact Confirmed by Midge Golas ([id number]) on 12/24/2023 11:38:25 PM  Radiology DG Chest 2 View Result Date: 12/24/2023 CLINICAL DATA:  Dizziness and chills, initial encounter EXAM: CHEST - 2 VIEW COMPARISON:  09/22/2023 FINDINGS: The heart size and mediastinal contours are within normal limits. Both lungs are clear. The visualized skeletal structures are unremarkable. IMPRESSION: No active cardiopulmonary disease. Electronically Signed   By: Oneil Devonshire M.D.   On: 12/24/2023 22:19    Procedures Procedures    Medications Ordered in ED Medications  ibuprofen  (ADVIL ) tablet 400 mg (400 mg Oral Given 12/25/23 0130)    ED Course/ Medical Decision Making/ A&P Clinical Course as of 12/25/23 0153  Wed Dec 25, 2023  0151 Patient presented with multiple vague complaints since traveling to [place].  Vital signs were appropriate.  Labs are overall unremarkable except mild elevation in LFTs.  She is in no acute distress.  Her physical exam was unremarkable.  No tachycardia or hypoxia to suggest PE.  She appears to be PERC negative.  At this point patient is safe for discharge home.  She should increase her fluid intake. She can follow with her PCP if no improvement in the next 3-5 days [DW]    Clinical Course User Index [DW] Midge Golas, MD                                 Medical Decision Making Amount and/or Complexity of Data Reviewed Labs: ordered. Radiology: ordered. ECG/medicine tests: ordered.  Risk Prescription drug management.           Final Clinical Impression(s) / ED Diagnoses Final diagnoses:  Generalized weakness  Myalgia    Rx / DC Orders ED Discharge Orders     None         Midge Golas, MD 12/25/23 [phone]

## 2023-12-25 NOTE — Discharge Instructions (Addendum)
Your caregiver has seen you today because you are having problems with feelings of weakness, dizziness, and/or fatigue. Weakness has many different causes, some of which are common and others are very rare. Your caregiver has considered some of the most common causes of weakness and feels it is safe for you to go home and be observed. Not every illness or injury can be identified during an emergency department visit, thus follow-up with your primary healthcare provider is important. Medical conditions can also worsen, so it is also important to return immediately as directed below, or if you have other serious concerns develop. ° °RETURN IMMEDIATELY IF  °you develop new shortness of breath, chest pain, fever, have difficulty moving parts of your body (new weakness, numbness, or incoordination), sudden change in speech, vision, swallowing, or understanding, faint or develop new dizziness, severe headache, become poorly responsive or have an altered mental status compared to baseline for you, new rash, abdominal pain, or bloody stools,  °Return sooner also if you develop new problems for which you have not talked to your caregiver but you feel may be emergency medical conditions. ° °

## 2024-04-15 ENCOUNTER — Encounter (HOSPITAL_BASED_OUTPATIENT_CLINIC_OR_DEPARTMENT_OTHER): Payer: Self-pay

## 2024-04-15 DIAGNOSIS — G43819 Other migraine, intractable, without status migrainosus: Secondary | ICD-10-CM | POA: Insufficient documentation

## 2024-04-15 DIAGNOSIS — R519 Headache, unspecified: Secondary | ICD-10-CM | POA: Diagnosis present

## 2024-04-15 MED ORDER — ONDANSETRON 4 MG PO TBDP
4.0000 mg | ORAL_TABLET | Freq: Once | ORAL | Status: AC
  Administered 2024-04-15: 4 mg via ORAL
  Filled 2024-04-15: qty 1

## 2024-04-15 MED ORDER — OXYCODONE-ACETAMINOPHEN 5-325 MG PO TABS
1.0000 | ORAL_TABLET | Freq: Once | ORAL | Status: AC
  Administered 2024-04-15: 1 via ORAL
  Filled 2024-04-15: qty 1

## 2024-04-15 NOTE — ED Notes (Signed)
 Pt encouraged to make staff aware of any changes/worsening/concerns while waiting. Verbal understanding expressed

## 2024-04-15 NOTE — ED Triage Notes (Signed)
 Pt has history of migraines. She has had severe headache since 1400. Tylenol  and Ibuprofen  has been ineffective. Prescribed Topamax for migraines but just started today. Pt also has nausea and dizziness and reports she often gets same symptoms with migraines.

## 2024-04-16 ENCOUNTER — Emergency Department (HOSPITAL_BASED_OUTPATIENT_CLINIC_OR_DEPARTMENT_OTHER)

## 2024-04-16 ENCOUNTER — Emergency Department (HOSPITAL_BASED_OUTPATIENT_CLINIC_OR_DEPARTMENT_OTHER)
Admission: EM | Admit: 2024-04-16 | Discharge: 2024-04-16 | Disposition: A | Discharge status: Home / Self Care | Attending: Emergency Medicine | Admitting: Emergency Medicine

## 2024-04-16 DIAGNOSIS — G43819 Other migraine, intractable, without status migrainosus: Secondary | ICD-10-CM

## 2024-04-16 LAB — COMPREHENSIVE METABOLIC PANEL WITH GFR
ALT: 17 U/L (ref 0–44)
AST: 20 U/L (ref 15–41)
Albumin: 3.9 g/dL (ref 3.5–5.0)
Alkaline Phosphatase: 58 U/L (ref 38–126)
Anion gap: 11 (ref 5–15)
BUN: 9 mg/dL (ref 6–20)
CO2: 22 mmol/L (ref 22–32)
Calcium: 9 mg/dL (ref 8.9–10.3)
Chloride: 108 mmol/L (ref 98–111)
Creatinine, Ser: 0.85 mg/dL (ref 0.44–1.00)
GFR, Estimated: >60 mL/min (ref >60)
Glucose, Bld: 89 mg/dL (ref 70–99)
Potassium: 4.1 mmol/L (ref 3.5–5.1)
Sodium: 141 mmol/L (ref 135–145)
Total Bilirubin: 0.3 mg/dL (ref 0.0–1.2)
Total Protein: 7.1 g/dL (ref 6.5–8.1)

## 2024-04-16 LAB — CBC WITH DIFFERENTIAL/PLATELET
Abs Immature Granulocytes: 0.01 10*3/uL (ref 0.00–0.07)
Basophils Absolute: 0 10*3/uL (ref 0.0–0.1)
Basophils Relative: 0 %
Eosinophils Absolute: 0.2 10*3/uL (ref 0.0–0.5)
Eosinophils Relative: 3 %
HCT: 38.2 % (ref 36.0–46.0)
Hemoglobin: 12.9 g/dL (ref 12.0–15.0)
Immature Granulocytes: 0 %
Lymphocytes Relative: 47 %
Lymphs Abs: 2.3 10*3/uL (ref 0.7–4.0)
MCH: 30.3 pg (ref 26.0–34.0)
MCHC: 33.8 g/dL (ref 30.0–36.0)
MCV: 89.7 fL (ref 80.0–100.0)
Monocytes Absolute: 0.5 10*3/uL (ref 0.1–1.0)
Monocytes Relative: 9 %
Neutro Abs: 2.1 10*3/uL (ref 1.7–7.7)
Neutrophils Relative %: 41 %
Platelets: 391 10*3/uL (ref 150–400)
RBC: 4.26 MIL/uL (ref 3.87–5.11)
RDW: 11.7 % (ref 11.5–15.5)
WBC: 5.1 10*3/uL (ref 4.0–10.5)
nRBC: 0 % (ref 0.0–0.2)

## 2024-04-16 LAB — PREGNANCY, URINE: Preg Test, Ur: NEGATIVE

## 2024-04-16 LAB — RESP PANEL BY RT-PCR (RSV, FLU A&B, COVID)  RVPGX2
Influenza A by PCR: NEGATIVE
Influenza B by PCR: NEGATIVE
Resp Syncytial Virus by PCR: NEGATIVE
SARS Coronavirus 2 by RT PCR: NEGATIVE

## 2024-04-16 MED ORDER — DIPHENHYDRAMINE HCL 50 MG/ML IJ SOLN
12.5000 mg | Freq: Once | INTRAMUSCULAR | Status: AC
  Administered 2024-04-16: 12.5 mg via INTRAVENOUS
  Filled 2024-04-16: qty 1

## 2024-04-16 MED ORDER — NAPROXEN 500 MG PO TABS: 500.0000 mg | ORAL_TABLET | Freq: Two times a day (BID) | ORAL | 0 refills | Status: DC

## 2024-04-16 MED ORDER — METOCLOPRAMIDE HCL 5 MG/ML IJ SOLN
10.0000 mg | Freq: Once | INTRAMUSCULAR | Status: AC
  Administered 2024-04-16: 10 mg via INTRAVENOUS
  Filled 2024-04-16: qty 2

## 2024-04-16 MED ORDER — METOCLOPRAMIDE HCL 10 MG PO TABS: 10.0000 mg | ORAL_TABLET | Freq: Four times a day (QID) | ORAL | 0 refills | Status: AC

## 2024-04-16 MED ORDER — KETOROLAC TROMETHAMINE 30 MG/ML IJ SOLN
30.0000 mg | Freq: Once | INTRAMUSCULAR | Status: AC
  Administered 2024-04-16: 30 mg via INTRAVENOUS
  Filled 2024-04-16: qty 1

## 2024-04-16 NOTE — ED Provider Notes (Signed)
 [facility] Provider Note   CSN: [id number] Arrival date & time: 04/15/24  2300     History  Chief Complaint  Patient presents with   Migraine    Stacey Reid is a 28 y.o. female. The patient, with a history of migraines, presents with a severe headache that started yesterday around 2 PM. They describe the headache as a constant problem, rating it a 7 out of 10. The headache initially started on one side of the head, then traveled to the back, and eventually spread all over the head. Accompanying the headache is a tingling sensation and numbness in the arm, causing discomfort and heaviness. The patient also reports feeling nauseous and dizzy, which prompted them to seek medical attention.  The patient has a history of severe migraines that have previously affected their ability to walk and caused them to fall. However, these severe symptoms only occur when the migraine is particularly bad. The patient also reports a tingling sensation in their fingertips that occurs with every headache.  The patient has been managing their migraines with Tylenol  and recently started taking Topiramate. They also mention a prescription for Ubrelvy, which they have been receiving as samples from their doctor's office due to insurance issues.  Nausea but no vomiting.     Home Medications Prior to Admission medications   Medication Sig Start Date End Date Taking? Authorizing Provider  metoCLOPramide  (REGLAN ) 10 MG tablet Take 1 tablet (10 mg total) by mouth every 6 (six) hours. Patient not taking: Reported on 03/16/2020 02/08/20 04/28/20  Johnella Naas, PA-C      Allergies    Phenergan  [promethazine ]    Review of Systems   Review of Systems  Constitutional:  Negative for chills and fever.  HENT:  Negative for ear pain and sore throat.   Eyes:  Positive for photophobia. Negative for pain and visual disturbance.  Respiratory:  Negative for cough and  shortness of breath.   Cardiovascular:  Negative for chest pain and palpitations.  Gastrointestinal:  Negative for abdominal pain and vomiting.  Genitourinary:  Negative for dysuria and hematuria.  Musculoskeletal:  Negative for arthralgias and back pain.  Skin:  Negative for color change and rash.  Neurological:  Positive for dizziness and headaches. Negative for seizures, syncope, facial asymmetry, speech difficulty, weakness, light-headedness and numbness.  All other systems reviewed and are negative.   Physical Exam Updated Vital Signs BP 114/76   Pulse 60   Temp 97.9 F (36.6 C)   Resp 18   LMP 02/04/2024   SpO2 100%  Physical Exam Vitals reviewed.  Constitutional:      Appearance: Normal appearance.  HENT:     Head: Normocephalic.     Nose: Nose normal.     Mouth/Throat:     Mouth: Mucous membranes are moist.  Eyes:     Conjunctiva/sclera: Conjunctivae normal.  Cardiovascular:     Rate and Rhythm: Normal rate and regular rhythm.  Pulmonary:     Effort: Pulmonary effort is normal.     Breath sounds: Normal breath sounds.  Musculoskeletal:     Cervical back: Normal range of motion.  Skin:    Capillary Refill: Capillary refill takes less than 2 seconds.  Neurological:     General: No focal deficit present.     Mental Status: She is alert and oriented to person, place, and time.     Cranial Nerves: No cranial nerve deficit.     Sensory:  No sensory deficit.     Motor: No weakness.     Coordination: Coordination normal.  Psychiatric:        Mood and Affect: Mood normal.        Behavior: Behavior normal.     ED Results / Procedures / Treatments   Labs (all labs ordered are listed, but only abnormal results are displayed) Labs Reviewed  RESP PANEL BY RT-PCR (RSV, FLU A&B, COVID)  RVPGX2  PREGNANCY, URINE  CBC WITH DIFFERENTIAL/PLATELET  COMPREHENSIVE METABOLIC PANEL WITH GFR    EKG EKG Interpretation Date/Time:  Thursday April 16 2024 03:07:31  EDT Ventricular Rate:  68 PR Interval:  142 QRS Duration:  74 QT Interval:  420 QTC Calculation: 446 R Axis:   -8  Text Interpretation: Normal sinus rhythm Normal ECG Confirmed by Kelsey Patricia [id number]) on 04/16/2024 3:12:45 AM  Radiology CT Head Wo Contrast Result Date: 04/16/2024 CLINICAL DATA:  Headache, increasing frequency or severity. History of migraines. EXAM: CT HEAD WITHOUT CONTRAST TECHNIQUE: Contiguous axial images were obtained from the base of the skull through the vertex without intravenous contrast. RADIATION DOSE REDUCTION: This exam was performed according to the departmental dose-optimization program which includes automated exposure control, adjustment of the mA and/or kV according to patient size and/or use of iterative reconstruction technique. COMPARISON:  CT head 02/08/2020. FINDINGS: Brain: No acute intracranial hemorrhage. No CT evidence of acute infarct. No edema, mass effect, or midline shift. The basilar cisterns are patent. Ventricles: The ventricles are normal. Vascular: No hyperdense vessel or unexpected calcification. Skull: No acute or aggressive finding. Orbits: Orbits are symmetric. Sinuses: The visualized paranasal sinuses are clear. Other: Mastoid air cells are clear. IMPRESSION: No CT evidence of acute intracranial abnormality. Electronically Signed   By: Denny Flack M.D.   On: 04/16/2024 08:25    Procedures Procedures    Medications Ordered in ED Medications  ondansetron  (ZOFRAN -ODT) disintegrating tablet 4 mg (4 mg Oral Given 04/15/24 2344)  oxyCODONE -acetaminophen  (PERCOCET/ROXICET) 5-325 MG per tablet 1 tablet (1 tablet Oral Given 04/15/24 2345)  metoCLOPramide  (REGLAN ) injection 10 mg (10 mg Intravenous Given 04/16/24 0752)  diphenhydrAMINE  (BENADRYL ) injection 12.5 mg (12.5 mg Intravenous Given 04/16/24 0751)  ketorolac  (TORADOL ) 30 MG/ML injection 30 mg (30 mg Intravenous Given 04/16/24 0843)    ED Course/ Medical Decision Making/ A&P                                  Medical Decision Making Amount and/or Complexity of Data Reviewed Labs: ordered. Radiology: ordered.  Risk Prescription drug management.   Medical Decision Making:   ANIS CINELLI is a 28 y.o. female who presented to the ED today with chronic migraines with recent exacerbation. Current severe headache unresponsive to acetaminophen  and ibuprofen . New symptoms of arm heaviness and numbness suggest possible migraine with aura or other neurological causes detailed below.     Complete initial physical exam performed, notably the patient was reporting of right arm heaviness but reassuringly without neurological deficits.    Reviewed and confirmed nursing documentation for past medical history, family history, social history.    Initial Assessment:   With the patient's presentation of headache and right arm heaviness, most likely diagnosis is migraine with aura, but would need to r/o other possible causes with CT head. Other diagnoses were considered including (but not limited to) CVA/TIA, mass, dehydration. These are considered less likely due to history of present illness and  physical exam findings.   This is most consistent with an acute complicated illness  Initial Plan:  CT head to assess for structural changes   Screening labs including CBC and Metabolic panel to evaluate for infectious or metabolic etiology of disease.  U-preg given patient age  EKG to evaluate for cardiac pathology Objective evaluation as below reviewed   Initial Study Results:   Laboratory  All laboratory results reviewed without evidence of clinically relevant pathology.     EKG EKG was reviewed independently. Rate, rhythm, axis, intervals all examined and without medically relevant abnormality. ST segments without concerns for elevations.    Radiology:  All images reviewed independently. Agree with radiology report at this time.   CT Head Wo Contrast Result Date:  04/16/2024 CLINICAL DATA:  Headache, increasing frequency or severity. History of migraines. EXAM: CT HEAD WITHOUT CONTRAST TECHNIQUE: Contiguous axial images were obtained from the base of the skull through the vertex without intravenous contrast. RADIATION DOSE REDUCTION: This exam was performed according to the departmental dose-optimization program which includes automated exposure control, adjustment of the mA and/or kV according to patient size and/or use of iterative reconstruction technique. COMPARISON:  CT head 02/08/2020. FINDINGS: Brain: No acute intracranial hemorrhage. No CT evidence of acute infarct. No edema, mass effect, or midline shift. The basilar cisterns are patent. Ventricles: The ventricles are normal. Vascular: No hyperdense vessel or unexpected calcification. Skull: No acute or aggressive finding. Orbits: Orbits are symmetric. Sinuses: The visualized paranasal sinuses are clear. Other: Mastoid air cells are clear. IMPRESSION: No CT evidence of acute intracranial abnormality. Electronically Signed   By: Denny Flack M.D.   On: 04/16/2024 08:25     Final Assessment and Plan:    Migraines: History of migraines with aura, agree with [facility] outpatient although understand financial limitations, on Topamax for migraine preventative. CT unremarkable for structural changes, which is reassuring with normal neurologic exam and history. Hemodynamically stable. Migraine cocktail given in ED and monitored patient (Toradol  given after CT to r/o hemorrhage prior to NSAID admin). Ensured able to ambulate without difficulty prior to discharge. Placed referral to neurology outpatient given longstanding history of migraines. Naproxen  BID x5 days and PRN after with food. Tylenol  as needed. Reglan  for antiemetic as needed. Strict return precautions, patient verbalized understanding. Patient has family member to drive her home for safe dispo.    Clinical Impression:  1. Other migraine without status  migrainosus, intractable      Data Unavailable          Final Clinical Impression(s) / ED Diagnoses Final diagnoses:  Other migraine without status migrainosus, intractable    Rx / DC Orders ED Discharge Orders     None         Ernestina Headland, MD 04/16/24 0900    Scarlette Currier, MD 04/16/24 [phone]

## 2024-04-16 NOTE — ED Notes (Signed)
 Patient transported to CT

## 2024-04-16 NOTE — Discharge Instructions (Addendum)
 We checked your brain for any abnormalities, this was a normal scan which is good!  We treated you for pain relief as well and monitored to make sure symptoms improved.  Take Naproxen  twice a day for a maximum of 5 days then take as needed, always take with food.   I have also placed a neurology referral, please wait for them to call you to schedule appt.   Tylenol  as well  Will send with antinausea medication as well as needed   Please follow up with PCP for your migraines.

## 2024-04-16 NOTE — ED Notes (Signed)
 Pt able to ambulate, feels better.

## 2024-04-16 NOTE — ED Notes (Signed)
 Pt states that she is continuing to have headache and heaviness in her right arm. Vitals rechecked and EKG order. Pt assured vitals WNL and will get a room as soon as there is availability. Encouraged to continue to communicate with staff about concerns.

## 2024-08-10 ENCOUNTER — Other Ambulatory Visit: Payer: Self-pay

## 2024-08-10 ENCOUNTER — Encounter (HOSPITAL_BASED_OUTPATIENT_CLINIC_OR_DEPARTMENT_OTHER): Payer: Self-pay | Admitting: Emergency Medicine

## 2024-08-10 ENCOUNTER — Emergency Department (HOSPITAL_BASED_OUTPATIENT_CLINIC_OR_DEPARTMENT_OTHER)

## 2024-08-10 ENCOUNTER — Emergency Department (HOSPITAL_BASED_OUTPATIENT_CLINIC_OR_DEPARTMENT_OTHER): Admission: EM | Admit: 2024-08-10 | Discharge: 2024-08-10 | Disposition: A | Discharge status: Home / Self Care

## 2024-08-10 DIAGNOSIS — G43809 Other migraine, not intractable, without status migrainosus: Secondary | ICD-10-CM | POA: Diagnosis present

## 2024-08-10 MED ORDER — ONDANSETRON 4 MG PO TBDP
4.0000 mg | ORAL_TABLET | Freq: Once | ORAL | Status: AC
  Administered 2024-08-10: 4 mg via ORAL
  Filled 2024-08-10: qty 1

## 2024-08-10 MED ORDER — METOCLOPRAMIDE HCL 10 MG PO TABS
10.0000 mg | ORAL_TABLET | Freq: Once | ORAL | Status: AC
  Administered 2024-08-10: 10 mg via ORAL
  Filled 2024-08-10: qty 1

## 2024-08-10 MED ORDER — KETOROLAC TROMETHAMINE 30 MG/ML IJ SOLN
30.0000 mg | Freq: Once | INTRAMUSCULAR | Status: AC
  Administered 2024-08-10: 30 mg via INTRAMUSCULAR
  Filled 2024-08-10: qty 1

## 2024-08-10 MED ORDER — DIPHENHYDRAMINE HCL 25 MG PO CAPS
25.0000 mg | ORAL_CAPSULE | Freq: Once | ORAL | Status: AC
  Administered 2024-08-10: 25 mg via ORAL
  Filled 2024-08-10: qty 1

## 2024-08-10 MED ORDER — BUTALBITAL-APAP-CAFFEINE 50-325-40 MG PO TABS: 1.0000 | ORAL_TABLET | Freq: Once | ORAL | 0 refills | Status: AC | PRN

## 2024-08-10 MED ORDER — OXYCODONE-ACETAMINOPHEN 5-325 MG PO TABS
1.0000 | ORAL_TABLET | Freq: Once | ORAL | Status: AC
  Administered 2024-08-10: 1 via ORAL
  Filled 2024-08-10: qty 1

## 2024-08-10 MED ORDER — KETOROLAC TROMETHAMINE 15 MG/ML IJ SOLN: 15.0000 mg | Freq: Once | INTRAMUSCULAR | Status: DC

## 2024-08-10 NOTE — ED Provider Notes (Signed)
 [place] [facility] Provider Note   CSN: [id number] Arrival date & time: 08/10/24  1404     Patient presents with: Headache   Stacey Reid is a 28 y.o. female with past medical history significant for migraines who presents with severe headache for the last few days present in right parietal region but reports some radiation to the left occasionally.  She reports that it bounces back and forth.  She rates the pain 8/10.  She denies any new numbness, tingling.  She reports occasional nauseousness, dizziness.  She denies any vomiting.  She reports some sensitivity to light.  She denies any double vision.    Headache      Prior to Admission medications   Medication Sig Start Date End Date Taking? Authorizing Provider  butalbital -acetaminophen -caffeine  (FIORICET ) 50-325-40 MG tablet Take 1 tablet by mouth once as needed for up to 1 dose for headache or migraine. 08/10/24  Yes Anyiah Coverdale H, PA-C  metoCLOPramide  (REGLAN ) 10 MG tablet Take 1 tablet (10 mg total) by mouth every 6 (six) hours. 04/16/24   Bryan Bianchi, MD  naproxen  (NAPROSYN ) 500 MG tablet Take 1 tablet (500 mg total) by mouth 2 (two) times daily. Twice daily for 5 days then as needed 04/16/24   Bryan Bianchi, MD    Allergies: Phenergan  [promethazine ]    Review of Systems  Neurological:  Positive for headaches.  All other systems reviewed and are negative.   Updated Vital Signs BP 113/82 (BP Location: Right Arm)   Pulse 65   Temp 97.9 F (36.6 C) (Oral)   Resp 18   Ht 5' 3 (1.6 m)   Wt 79.4 kg   LMP 06/22/2024   SpO2 100%   BMI 31.00 kg/m   Physical Exam Vitals and nursing note reviewed.  Constitutional:      General: She is not in acute distress.    Appearance: Normal appearance.  HENT:     Head: Normocephalic and atraumatic.  Eyes:     General:        Right eye: No discharge.        Left eye: No discharge.  Cardiovascular:     Rate and Rhythm:  Normal rate and regular rhythm.     Heart sounds: No murmur heard.    No friction rub. No gallop.  Pulmonary:     Effort: Pulmonary effort is normal.     Breath sounds: Normal breath sounds.  Abdominal:     General: Bowel sounds are normal.     Palpations: Abdomen is soft.  Skin:    General: Skin is warm and dry.     Capillary Refill: Capillary refill takes less than 2 seconds.  Neurological:     Mental Status: She is alert and oriented to person, place, and time.     Comments: Cranial nerves II through XII grossly intact.  Intact finger-nose, intact heel-to-shin.  Romberg negative, gait normal.  Alert and oriented x3.  Moves all 4 limbs spontaneously, normal coordination.  No pronator drift.  Intact strength 5 out of 5 bilateral upper and lower extremities.  Psychiatric:        Mood and Affect: Mood normal.        Behavior: Behavior normal.     (all labs ordered are listed, but only abnormal results are displayed) Labs Reviewed - No data to display  EKG: None  Radiology: No results found.    Procedures   Medications Ordered in the ED  diphenhydrAMINE  (BENADRYL ) capsule 25 mg (25 mg Oral Given 08/10/24 1647)  oxyCODONE -acetaminophen  (PERCOCET/ROXICET) 5-325 MG per tablet 1 tablet (1 tablet Oral Given 08/10/24 1647)  metoCLOPramide  (REGLAN ) tablet 10 mg (10 mg Oral Given 08/10/24 1647)  ondansetron  (ZOFRAN -ODT) disintegrating tablet 4 mg (4 mg Oral Given 08/10/24 1648)  ketorolac  (TORADOL ) 30 MG/ML injection 30 mg (30 mg Intramuscular Given 08/10/24 1656)                                    Medical Decision Making Amount and/or Complexity of Data Reviewed Radiology: ordered.  Risk Prescription drug management.   This patient is a 28 y.o. female  who presents to the ED for concern of headache.   Differential diagnoses prior to evaluation: The emergent differential diagnosis includes, but is not limited to,  Stroke, increased ICP, meningitis, CVA, intracranial tumor,  venous sinus thrombosis, migraine, cluster headache, hypertension, drug related, head injury, tension headache, sinusitis, dental abscess, otitis media, TMJ . This is not an exhaustive differential.   Past Medical History / Co-morbidities / Social History: History of migraines  Additional history: Chart reviewed. Pertinent results include: Reviewed lab work Imaging from previous emergency department visits, she was seen for similar in April, reports that she had good resolution of symptoms after migraine cocktail.  Physical Exam: Physical exam performed. The pertinent findings include: Vital signs stable in the emergency department, no focal neurologic deficits on exam.  Lab Tests/Imaging studies: I personally interpreted labs/imaging and the pertinent results include: Independent turbid CT head with no contrast which shows no evidence of acute intracranial abnormality.  Consider additional lab work but given similar presentation to previous, I think reasonable to forego additional lab work at this time. I agree with the radiologist interpretation.   Medications: I ordered medication including given IM Toradol , Benadryl , Reglan , Zofran , Percocet, on reassessment patient reports significant improvement of headache.  I have reviewed the patients home medicines and have made adjustments as needed.   Disposition: After consideration of the diagnostic results and the patients response to treatment, I feel that patient feeling improved, stable for discharge at this time .   emergency department workup does not suggest an emergent condition requiring admission or immediate intervention beyond what has been performed at this time. The plan is: as above. The patient is safe for discharge and has been instructed to return immediately for worsening symptoms, change in symptoms or any other concerns.    Final diagnoses:  Other migraine without status migrainosus, not intractable    ED Discharge Orders           Ordered    butalbital -acetaminophen -caffeine  (FIORICET ) 50-325-40 MG tablet  Once PRN        08/10/24 1756               Lashe Oliveira, Sherlean DEL, PA-C 08/14/24 1203    Ruthe Cornet, DO 08/15/24 2137

## 2024-08-10 NOTE — Discharge Instructions (Addendum)
 Please use Tylenol  or ibuprofen  for pain.  You may use 600 mg ibuprofen  every 6 hours or 1000 mg of Tylenol  every 6 hours.  You may choose to alternate between the 2.  This would be most effective.  Not to exceed 4 g of Tylenol  within 24 hours.  Not to exceed 3200 mg ibuprofen  24 hours.  You can take the medication I prescribed once at nighttime as needed if you have headaches in the future.  It can make you drowsy.  I do not recommend taking it if you are going to be driving.  Please return if you have significant worsening headache despite treatment.  It was a pleasure taking care of you today.

## 2024-08-10 NOTE — ED Triage Notes (Signed)
 Migrain x 2 weeks has taken OTC meds not helping has been having blurred visison , drove herself here

## 2024-08-10 NOTE — ED Notes (Signed)
 Pt d/c home per EDP order. Pt reports friend is discharge ride home. Discharge summary reviewed, verbalizes understanding. Ambulatory off unit. NAD

## 2024-09-24 ENCOUNTER — Ambulatory Visit (HOSPITAL_BASED_OUTPATIENT_CLINIC_OR_DEPARTMENT_OTHER)
Admission: RE | Admit: 2024-09-24 | Discharge: 2024-09-24 | Disposition: A | Discharge status: Home / Self Care | Source: Ambulatory Visit

## 2024-09-24 ENCOUNTER — Ambulatory Visit (INDEPENDENT_AMBULATORY_CARE_PROVIDER_SITE_OTHER)

## 2024-09-24 VITALS — BP 110/80 | Ht 63.0 in | Wt 177.0 lb

## 2024-09-24 DIAGNOSIS — M222X1 Patellofemoral disorders, right knee: Secondary | ICD-10-CM

## 2024-09-24 DIAGNOSIS — M222X2 Patellofemoral disorders, left knee: Secondary | ICD-10-CM | POA: Insufficient documentation

## 2024-09-24 NOTE — Progress Notes (Signed)
   Subjective:    Patient ID: Genie CHRISTELLA Jasper, female    DOB: 28 y.o., [birth year]   MRN: [id number]  HPI  Chief Complaint: Bilateral knee pain  Patient is a 28 year old female presenting for bilateral knee pain Has previously been seen by Dr. Venetia Evans last on 01/08/2022. Diagnosed with prepatellar bursitis of the left knee at that time after had a trauma and landed onto the kneecap.  Physical therapy resolved her pain at that time. Unfortunately, she has begun to experience a slightly different kind of pain but this time in both knees. No known inciting event. No treatments tried. Not limiting her daily activities. Uncomfortable with ascending/descending stairs.  Review of pertinent imaging: 4 view plain film radiographs obtained of the right knee on 10/10/2020 per my independent evaluation revealing fabella posteriorly.  Medial compartment joint space narrowing to a mild extent.  No significant osteophytic or degenerative changes noted elsewhere.  Otherwise normal.  No acute osseous abnormalities.    Objective:   Physical Exam Vitals:   09/24/24 1541  BP: 110/80    Left Knee (compared to normal) -Inspection: no swelling, erythema, deformity or visible effusion. -Palpation: TTP - quad tendon, + patella, - patellar tendon, - tibial tuberosity, - pes bursa, - gerdy tubercle, - medial joint line, - lateral joint line, - posterior knee, - medial and lateral hamstrings. No significant crepitus with flexion/extension. -AROM/PROM: 0 degrees extension, 150 degrees flexion, normal hamstring flexibility -Strength: prominent medial movement of knee with single leg squat, 5/5 flexion, 5/5 extension -Special tests:    -ACL: - lachman, - lever test   -MCL: 2+ and painless with valgus at 0/30 degrees   -LCL: 1+ and painless with varus at 0/30 degrees   -PCL: - sag sign   -Meniscus: - thessaly, - McMurray   -Patellofemoral: equivocal patellar grind  Right Knee (compared to  normal) -Inspection: no swelling, erythema, deformity or visible effusion. -Palpation: TTP - quad tendon, + patella, - patellar tendon, - tibial tuberosity, - pes bursa, - gerdy tubercle, - medial joint line, - lateral joint line, - posterior knee, - medial and lateral hamstrings. No significant crepitus with flexion/extension. -AROM/PROM: 0 degrees extension, 150 degrees flexion, normal hamstring flexibility -Strength: prominent medial movement of knee with single leg squat, 5/5 flexion, 5/5 extension -Special tests:    -ACL: - lachman, - lever test   -MCL: 2+ and painless with valgus at 0/30 degrees   -LCL: 1+ and painless with varus at 0/30 degrees   -PCL: - sag sign   -Meniscus: - thessaly, - McMurray   -Patellofemoral: equivocal patellar grind     Assessment & Plan:   Chaylee is a 28 y.o. 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 I discussed the nature of this diagnosis with her and the treatment options. I recommend initiating PT, using an anti-inflammatory either topically or orally if the pain precludes her full participation in PT, and follow up in 6-8 weeks.  Addendum: Xrays updated on her way out the door today which per my independent evaluation and review reveal fabellae posteriorly in both knees, lateral patellar tilt (R>L), mild medial compartment joint space narrowing bilaterally.

## 2024-09-28 ENCOUNTER — Ambulatory Visit: Payer: Self-pay

## 2024-10-06 MED ORDER — DICLOFENAC SODIUM 50 MG PO TBEC: 50.0000 mg | DELAYED_RELEASE_TABLET | Freq: Two times a day (BID) | ORAL | 1 refills | Status: AC

## 2024-10-06 NOTE — Addendum Note (Signed)
 Addended by: Bentleigh Stankus A on: 10/06/2024 06:34 AM   Modules accepted: Orders

## 2024-10-07 NOTE — Therapy (Incomplete)
 OUTPATIENT PHYSICAL THERAPY LOWER EXTREMITY EVALUATION   Patient Name: Stacey Reid MRN: [id number] DOB:[birth year], 28 y.o., female Today's Date: 10/07/2024   END OF SESSION:   Past Medical History:  Diagnosis Date   Anemia affecting pregnancy 03/21/2017   Asthma    years ago, exercise induced   Headache    with pregnancy   Heart murmur    history of, resolved 3 months later   Hyperemesis 2016   Hypertension    Infection    UTI   Pregnancy induced hypertension    Syncope    in high school, found out had a heart murmur   Thyroid disease    Past Surgical History:  Procedure Laterality Date   LAPAROSCOPIC TUBAL LIGATION Bilateral 07/02/2017   Procedure: LAPAROSCOPIC TUBAL LIGATION - Filshie clips;  Surgeon: Lorence Ozell CROME, MD;  Location: [facility];  Service: Gynecology;  Laterality: Bilateral;   WISDOM TOOTH EXTRACTION     Patient Active Problem List   Diagnosis Date Noted   Prepatellar bursitis of left knee 01/08/2022   Patellofemoral pain syndrome of both knees 10/27/2020   Post-operative state 07/16/2017   Gestational hypertension 06/25/2017   Sickle cell trait 11/13/2016   Low vitamin D  level 11/13/2016   Asthma exacerbation, mild 11/05/2016    PCP: [facility]   REFERRING PROVIDER: Charles Redell LABOR, DO   REFERRING DIAG:  M22.2X1 (ICD-10-CM) - Patellofemoral pain syndrome of right knee  M22.2X2 (ICD-10-CM) - Patellofemoral pain syndrome of left knee    THERAPY DIAG:  No diagnosis found.  RATIONALE FOR EVALUATION AND TREATMENT: Rehabilitation  ONSET DATE: ***  NEXT MD VISIT: ***   SUBJECTIVE:                                                                                                                                                                                                         SUBJECTIVE STATEMENT: 28 y/o patient referred to PT from Dr Charles for B PFPS.  Xrays showed presence of B fabella.    PAIN: Are you  having pain? Yes: NPRS scale: *** Pain location: *** Pain description: *** Aggravating factors: *** Relieving factors: ***  PERTINENT HISTORY:  Migraines, exercise induced asthma, HTN  PRECAUTIONS: {Therapy precautions:24002}  RED FLAGS: {PT Red Flags:29287}  WEIGHT BEARING RESTRICTIONS: No  FALLS:  Has patient fallen in last 6 months? {fallsyesno:27318}  LIVING ENVIRONMENT: Lives with: {[facility] lives with:25569::lives with their family} Lives in: {Lives in:25570} Stairs: {opstairs:27293} Has following equipment at home: {Assistive devices:23999}  OCCUPATION: ***  PLOF: {PLOF:24004}  PATIENT GOALS: ***  OBJECTIVE: (objective measures completed at initial evaluation unless otherwise dated)  DIAGNOSTIC FINDINGS:  CLINICAL DATA:  Bilateral knee pain, left-greater-than-right.   EXAM:  LEFT KNEE - COMPLETE 4+ VIEW; RIGHT KNEE - COMPLETE 4+ VIEW   COMPARISON:  Right knee radiographs dated 10/10/2020.   FINDINGS:  No evidence of fracture, dislocation, or joint effusion. No evidence  of arthropathy or other focal bone abnormality. Soft tissues are ...   Study Result  Narrative & Impression  CLINICAL DATA:  Bilateral knee pain, left-greater-than-right.   EXAM: LEFT KNEE - COMPLETE 4+ VIEW; RIGHT KNEE - COMPLETE 4+ VIEW   COMPARISON:  Right knee radiographs dated 10/10/2020.   FINDINGS: No evidence of fracture, dislocation, or joint effusion. No evidence of arthropathy or other focal bone abnormality. Soft tissues are unremarkable.   IMPRESSION: Unremarkable knee radiographs.  No acute abnormality.     Electronically Signed   By: Harrietta Sherry M.D.   On: 09/24/2024 17:20    PATIENT SURVEYS:  LEFS  Extreme difficulty/unable (0), Quite a bit of difficulty (1), Moderate difficulty (2), Little difficulty (3), No difficulty (4) Survey date:    Any of your usual work, housework or school activities   2. Usual hobbies, recreational or sporting activities    3. Getting into/out of the bath   4. Walking between rooms   5. Putting on socks/shoes   6. Squatting    7. Lifting an object, like a bag of groceries from the floor   8. Performing light activities around your home   9. Performing heavy activities around your home   10. Getting into/out of a car   11. Walking 2 blocks   12. Walking 1 mile   13. Going up/down 10 stairs (1 flight)   14. Standing for 1 hour   15.  sitting for 1 hour   16. Running on even ground   17. Running on uneven ground   18. Making sharp turns while running fast   19. Hopping    20. Rolling over in bed   Score total:  ***     COGNITION: Overall cognitive status: {cognition:24006}    SENSATION: {sensation:27233}  EDEMA:  {edema:24020}  POSTURE:  {posture:25561}  PALPATION: ***  MUSCLE LENGTH: Hamstrings: Right *** deg; Left *** deg [name]: Right *** deg; Left *** deg Hamstrings: *** ITB: *** Piriformis: *** Hip flexors: *** Quads: *** Heelcord: ***  LOWER EXTREMITY ROM:  {AROM/PROM:27142} ROM Right eval Left eval  Hip flexion    Hip extension    Hip abduction    Hip adduction    Hip internal rotation    Hip external rotation    Knee flexion    Knee extension    Ankle dorsiflexion    Ankle plantarflexion    Ankle inversion    Ankle eversion     {AROM/PROM:27142} ROM Right eval Left eval  Hip flexion    Hip extension    Hip abduction    Hip adduction    Hip internal rotation    Hip external rotation    Knee flexion    Knee extension    Ankle dorsiflexion    Ankle plantarflexion    Ankle inversion    Ankle eversion    (Blank rows = not tested)  LOWER EXTREMITY MMT:  MMT Right eval Left eval  Hip flexion    Hip extension    Hip abduction    Hip adduction    Hip internal rotation    Hip external  rotation    Knee flexion    Knee extension    Ankle dorsiflexion    Ankle plantarflexion    Ankle inversion    Ankle eversion     (Blank rows = not  tested)  LOWER EXTREMITY SPECIAL TESTS:  {LEspecialtests:26242}  FUNCTIONAL TESTS:  {Functional tests:24029}  GAIT: Distance walked: *** Assistive device utilized: {Assistive devices:23999} Level of assistance: {Levels of assistance:24026} Gait pattern: {gait characteristics:25376} Comments: ***   TODAY'S TREATMENT:   ***   PATIENT EDUCATION:  Education details: {Education details:27468}  Person educated: {Person educated:25204} Education method: {Education Method EU:67125} Education comprehension: {Education Comprehension:25206}  HOME EXERCISE PROGRAM: ***   ASSESSMENT:  CLINICAL IMPRESSION: ILLIANNA PASCHAL is a 28 y.o. female who was referred to physical therapy for evaluation and treatment for ***.  ***   Patient reports onset of *** pain beginning ***. Pain is worse with ***.  Patient has deficits in *** ROM, *** LE flexibility, *** strength, ***abnormal posture, and TTP with abnormal muscle tension *** which are interfering with ADLs and are impacting quality of life.  On LEFS patient scored ***/80 demonstrating *** functional limitation.  Ashtin Maresa will benefit from skilled PT to address above deficits to improve mobility and activity tolerance with decreased pain interference.  OBJECTIVE IMPAIRMENTS: {opptimpairments:25111}.   ACTIVITY LIMITATIONS: {activitylimitations:27494}  PARTICIPATION LIMITATIONS: {participationrestrictions:25113}  PERSONAL FACTORS: {Personal factors:25162} are also affecting patient's functional outcome.   REHAB POTENTIAL: {rehabpotential:25112}  CLINICAL DECISION MAKING: {clinical decision making:25114}  EVALUATION COMPLEXITY: {Evaluation complexity:25115}   GOALS: Goals reviewed with patient? {yes/no:20286}  SHORT TERM GOALS: Target date: ***  Patient will be independent with initial HEP. Baseline: *** Goal status: {GOALSTATUS:25110}  2.  Patient will report at least 25% improvement in *** knee pain to improve  QOL. Baseline: *** Goal status: {GOALSTATUS:25110}  3.  *** Baseline: *** Goal status: {GOALSTATUS:25110}   LONG TERM GOALS: Target date: ***  Patient will be independent with advanced/ongoing HEP to improve outcomes and carryover.  Baseline: *** Goal status: {GOALSTATUS:25110}  2.  Patient will report at least 50-75% improvement in *** knee pain to improve QOL. Baseline: *** Goal status: {GOALSTATUS:25110}  3.  Patient will demonstrate improved *** knee AROM to >/= ***-*** deg to allow for normal gait and stair mechanics. Baseline: Refer to above LE ROM table Goal status: {GOALSTATUS:25110}  4.  Patient will demonstrate improved *** strength to >/= ***/5 for improved stability and ease of mobility. Baseline: Refer to above LE MMT table Goal status: {GOALSTATUS:25110}  5.  Patient will be able to ambulate 600' with LRAD and normal gait pattern without increased pain to access community.  Baseline: *** Goal status: {GOALSTATUS:25110}  6. Patient will be able to ascend/descend stairs with 1 HR and reciprocal step pattern safely to access home and community.  Baseline: *** Goal status: {GOALSTATUS:25110}  7.  Patient will report >/= ***/80 on LEFS (MCID = 9 pts) to demonstrate improved functional ability. Baseline: *** Goal status: {GOALSTATUS:25110}  8.  Patient will demonstrate at least 19/24 on DGI to decrease risk of falls. Baseline: *** Goal status: {GOALSTATUS:25110}   9.  *** Baseline: *** Goal status: {GOALSTATUS:25110}   PLAN:  PT FREQUENCY: {rehab frequency:25116}  PT DURATION: {rehab duration:25117}  PLANNED INTERVENTIONS: {rehab planned interventions:25118::97110-Therapeutic exercises,97530- Therapeutic [phone]- Neuromuscular re-education,97535- Self Rjmz,02859- Manual therapy,Patient/Family education}  PLAN FOR NEXT SESSION: PIERRETTE RED SENIOR, PT 10/07/2024, 9:42 PM

## 2024-10-09 ENCOUNTER — Ambulatory Visit: Admitting: Rehabilitation

## 2024-11-18 ENCOUNTER — Encounter

## 2024-11-18 NOTE — Progress Notes (Signed)
 Erroneous encounter due to cancellation or no-show. Patient was not seen.

## 2024-12-26 ENCOUNTER — Other Ambulatory Visit: Payer: Self-pay

## 2024-12-26 ENCOUNTER — Emergency Department (HOSPITAL_BASED_OUTPATIENT_CLINIC_OR_DEPARTMENT_OTHER)
Admission: EM | Admit: 2024-12-26 | Discharge: 2024-12-26 | Disposition: A | Discharge status: Home / Self Care | Attending: Emergency Medicine | Admitting: Emergency Medicine

## 2024-12-26 ENCOUNTER — Encounter (HOSPITAL_BASED_OUTPATIENT_CLINIC_OR_DEPARTMENT_OTHER): Payer: Self-pay

## 2024-12-26 DIAGNOSIS — X58XXXA Exposure to other specified factors, initial encounter: Secondary | ICD-10-CM | POA: Insufficient documentation

## 2024-12-26 DIAGNOSIS — S0502XA Injury of conjunctiva and corneal abrasion without foreign body, left eye, initial encounter: Secondary | ICD-10-CM | POA: Insufficient documentation

## 2024-12-26 DIAGNOSIS — H5712 Ocular pain, left eye: Secondary | ICD-10-CM | POA: Diagnosis present

## 2024-12-26 MED ORDER — ERYTHROMYCIN 5 MG/GM OP OINT
TOPICAL_OINTMENT | Freq: Once | OPHTHALMIC | Status: AC
  Administered 2024-12-26: 1 via OPHTHALMIC
  Filled 2024-12-26: qty 3.5

## 2024-12-26 MED ORDER — ERYTHROMYCIN 5 MG/GM OP OINT: TOPICAL_OINTMENT | OPHTHALMIC | 0 refills | Status: DC

## 2024-12-26 MED ORDER — ERYTHROMYCIN 5 MG/GM OP OINT: TOPICAL_OINTMENT | OPHTHALMIC | 0 refills | Status: AC

## 2024-12-26 MED ORDER — TETRACAINE HCL 0.5 % OP SOLN
1.0000 [drp] | Freq: Once | OPHTHALMIC | Status: AC
  Administered 2024-12-26: 1 [drp] via OPHTHALMIC
  Filled 2024-12-26: qty 4

## 2024-12-26 MED ORDER — FLUORESCEIN SODIUM 1 MG OP STRP
1.0000 | ORAL_STRIP | Freq: Once | OPHTHALMIC | Status: AC
  Administered 2024-12-26: 1 via OPHTHALMIC
  Filled 2024-12-26: qty 1

## 2024-12-26 NOTE — Discharge Instructions (Addendum)
 You have a scratch to your eye.  We call this a corneal abrasion.  Apply erythromycin  ointment 2 times per day for 1 week.  This will help the eye heal.  I have included a telephone number for an ophthalmologist.  Call them to make a follow-up appointment to make sure that your eye is healing appropriately.

## 2024-12-26 NOTE — ED Notes (Signed)
 RN provided AVS using Teachback Method. Patient verbalizes understanding of Discharge Instructions. Opportunity for Questioning and Answers were provided by RN. Patient Discharged from ED ambulatory to home via self.

## 2024-12-26 NOTE — ED Triage Notes (Signed)
 Patient here POV from Home.  Endorses about an hour ago, discomfort to left eye. Pulled a hair out of same shortly thereafter. Notes discomfort, blurriness relieved by blinking.  NAD noted during triage. A&Ox4. GCS 15. Ambulatory.

## 2024-12-26 NOTE — ED Provider Notes (Signed)
" °  [place] [facility] Provider Note   CSN: [id number] Arrival date & time: 12/26/24  2112     Patient presents with: Eye Problem   Stacey Reid is a 29 y.o. female.   29 year old female here today for left-sided eye pain.  She says that she felt as though she had something in her eye for a bit, and pulled a long hair that appeared to have been trapped under her eyelid.  She does not have any trouble with her vision.   Eye Problem      Prior to Admission medications  Medication Sig Start Date End Date Taking? Authorizing Provider  butalbital -acetaminophen -caffeine  (FIORICET ) 50-325-40 MG tablet Take 1 tablet by mouth once as needed for up to 1 dose for headache or migraine. 08/10/24   Prosperi, Christian H, PA-C  diclofenac  (VOLTAREN ) 50 MG EC tablet Take 1 tablet (50 mg total) by mouth 2 (two) times daily. 10/06/24   Gottwalt, Redell LABOR, DO  erythromycin  ophthalmic ointment Place a 1/2 inch ribbon of ointment into the lower eyelid two times per day for 1 week. 12/26/24   Mannie Fairy DASEN, DO  metoCLOPramide  (REGLAN ) 10 MG tablet Take 1 tablet (10 mg total) by mouth every 6 (six) hours. 04/16/24   Bryan Bianchi, MD    Allergies: Phenergan  [promethazine ]    Review of Systems  Updated Vital Signs BP (!) 131/96 (BP Location: Right Arm)   Pulse 69   Temp 98.3 F (36.8 C) (Oral)   Resp 18   Ht 5' 3 (1.6 m)   Wt 80.3 kg   SpO2 99%   BMI 31.35 kg/m   Physical Exam Vitals and nursing note reviewed.  Eyes:     Comments: Mild corneal injection.  Eye numbed, pain resolved.  Grossly normal vision.  Extraocular muscles intact.  Negative Sidel sign.  Stained, corneal abrasions noted over the iris but not overlying the pupil.  Lids everted no foreign body.  Neurological:     Mental Status: She is alert.     (all labs ordered are listed, but only abnormal results are displayed) Labs Reviewed - No data to  display  EKG: None  Radiology: No results found.   Procedures   Medications Ordered in the ED  erythromycin  ophthalmic ointment (has no administration in time range)  tetracaine  (PONTOCAINE) 0.5 % ophthalmic solution 1 drop (1 drop Left Eye Given 12/26/24 2141)  fluorescein  ophthalmic strip 1 strip (1 strip Left Eye Given 12/26/24 2141)                                    Medical Decision Making 29 year old female here today for eye pain.  Differential diagnoses include corneal abrasion, less likely acute angle-closure glaucoma.  Plan-patient with a corneal abrasion.  Improved with eyedrops, erythromycin  ointment provided here.  Ophthalmology follow-up provided.  Will discharge.  Risk Prescription drug management.        Final diagnoses:  Abrasion of left cornea, initial encounter    ED Discharge Orders          Ordered    erythromycin  ophthalmic ointment  Status:  Discontinued        12/26/24 2158    erythromycin  ophthalmic ointment        12/26/24 2158               Mannie Fairy T, DO 12/26/24 2159  "
